# Patient Record
Sex: Female | Born: 1935 | Race: White | Hispanic: No | Smoking: Never smoker
Health system: Southern US, Community
[De-identification: ages and names within clinical notes are randomized; demographics above are authoritative.]

## PROBLEM LIST (undated history)

## (undated) DIAGNOSIS — K5790 Diverticulosis of intestine, part unspecified, without perforation or abscess without bleeding: Secondary | ICD-10-CM

## (undated) DIAGNOSIS — E785 Hyperlipidemia, unspecified: Secondary | ICD-10-CM

## (undated) DIAGNOSIS — I639 Cerebral infarction, unspecified: Secondary | ICD-10-CM

## (undated) DIAGNOSIS — H269 Unspecified cataract: Secondary | ICD-10-CM

## (undated) DIAGNOSIS — K279 Peptic ulcer, site unspecified, unspecified as acute or chronic, without hemorrhage or perforation: Secondary | ICD-10-CM

## (undated) DIAGNOSIS — I2510 Atherosclerotic heart disease of native coronary artery without angina pectoris: Secondary | ICD-10-CM

## (undated) DIAGNOSIS — M810 Age-related osteoporosis without current pathological fracture: Secondary | ICD-10-CM

## (undated) DIAGNOSIS — Z889 Allergy status to unspecified drugs, medicaments and biological substances status: Secondary | ICD-10-CM

## (undated) DIAGNOSIS — F419 Anxiety disorder, unspecified: Secondary | ICD-10-CM

## (undated) DIAGNOSIS — Z9861 Coronary angioplasty status: Secondary | ICD-10-CM

## (undated) DIAGNOSIS — K311 Adult hypertrophic pyloric stenosis: Secondary | ICD-10-CM

## (undated) DIAGNOSIS — I480 Paroxysmal atrial fibrillation: Secondary | ICD-10-CM

## (undated) DIAGNOSIS — F329 Major depressive disorder, single episode, unspecified: Secondary | ICD-10-CM

## (undated) DIAGNOSIS — F32A Depression, unspecified: Secondary | ICD-10-CM

## (undated) DIAGNOSIS — M1990 Unspecified osteoarthritis, unspecified site: Secondary | ICD-10-CM

## (undated) DIAGNOSIS — I701 Atherosclerosis of renal artery: Secondary | ICD-10-CM

## (undated) DIAGNOSIS — G459 Transient cerebral ischemic attack, unspecified: Secondary | ICD-10-CM

## (undated) DIAGNOSIS — I10 Essential (primary) hypertension: Secondary | ICD-10-CM

## (undated) DIAGNOSIS — K219 Gastro-esophageal reflux disease without esophagitis: Secondary | ICD-10-CM

## (undated) DIAGNOSIS — Z9289 Personal history of other medical treatment: Secondary | ICD-10-CM

## (undated) HISTORY — PX: TONSILLECTOMY: SUR1361

## (undated) HISTORY — DX: Atherosclerosis of renal artery: I70.1

## (undated) HISTORY — DX: Hyperlipidemia, unspecified: E78.5

## (undated) HISTORY — DX: Depression, unspecified: F32.A

## (undated) HISTORY — DX: Cerebral infarction, unspecified: I63.9

## (undated) HISTORY — PX: TRANSTHORACIC ECHOCARDIOGRAM: SHX275

## (undated) HISTORY — PX: COLONOSCOPY: SHX174

## (undated) HISTORY — DX: Diverticulosis of intestine, part unspecified, without perforation or abscess without bleeding: K57.90

## (undated) HISTORY — DX: Unspecified osteoarthritis, unspecified site: M19.90

## (undated) HISTORY — DX: Gastro-esophageal reflux disease without esophagitis: K21.9

## (undated) HISTORY — DX: Anxiety disorder, unspecified: F41.9

## (undated) HISTORY — DX: Coronary angioplasty status: Z98.61

## (undated) HISTORY — DX: Essential (primary) hypertension: I10

## (undated) HISTORY — DX: Unspecified cataract: H26.9

## (undated) HISTORY — DX: Atherosclerotic heart disease of native coronary artery without angina pectoris: I25.10

## (undated) HISTORY — DX: Allergy status to unspecified drugs, medicaments and biological substances: Z88.9

## (undated) HISTORY — PX: CATARACT EXTRACTION: SUR2

## (undated) HISTORY — DX: Paroxysmal atrial fibrillation: I48.0

## (undated) HISTORY — DX: Major depressive disorder, single episode, unspecified: F32.9

## (undated) HISTORY — DX: Adult hypertrophic pyloric stenosis: K31.1

## (undated) HISTORY — DX: Peptic ulcer, site unspecified, unspecified as acute or chronic, without hemorrhage or perforation: K27.9

## (undated) HISTORY — PX: JOINT REPLACEMENT: SHX530

## (undated) HISTORY — DX: Transient cerebral ischemic attack, unspecified: G45.9

## (undated) HISTORY — DX: Personal history of other medical treatment: Z92.89

## (undated) HISTORY — DX: Age-related osteoporosis without current pathological fracture: M81.0

---

## 1998-02-02 ENCOUNTER — Other Ambulatory Visit: Admission: RE | Admit: 1998-02-02 | Discharge: 1998-02-02 | Payer: Self-pay | Admitting: Family Medicine

## 1998-02-09 ENCOUNTER — Other Ambulatory Visit: Admission: RE | Admit: 1998-02-09 | Discharge: 1998-02-09 | Payer: Self-pay | Admitting: Family Medicine

## 1998-04-19 ENCOUNTER — Ambulatory Visit (HOSPITAL_COMMUNITY): Admission: RE | Admit: 1998-04-19 | Discharge: 1998-04-19 | Payer: Self-pay | Admitting: Gastroenterology

## 1999-02-15 ENCOUNTER — Other Ambulatory Visit: Admission: RE | Admit: 1999-02-15 | Discharge: 1999-02-15 | Payer: Self-pay | Admitting: Family Medicine

## 2000-05-29 ENCOUNTER — Other Ambulatory Visit: Admission: RE | Admit: 2000-05-29 | Discharge: 2000-05-29 | Payer: Self-pay | Admitting: Family Medicine

## 2001-11-10 ENCOUNTER — Encounter: Payer: Self-pay | Admitting: Gastroenterology

## 2001-11-10 DIAGNOSIS — K222 Esophageal obstruction: Secondary | ICD-10-CM

## 2001-11-10 DIAGNOSIS — K5730 Diverticulosis of large intestine without perforation or abscess without bleeding: Secondary | ICD-10-CM | POA: Insufficient documentation

## 2002-03-13 ENCOUNTER — Encounter: Admission: RE | Admit: 2002-03-13 | Discharge: 2002-03-13 | Payer: Self-pay | Admitting: Family Medicine

## 2002-03-13 ENCOUNTER — Encounter: Payer: Self-pay | Admitting: Family Medicine

## 2002-04-22 HISTORY — PX: REPLACEMENT TOTAL HIP W/  RESURFACING IMPLANTS: SUR1222

## 2002-06-02 ENCOUNTER — Encounter: Payer: Self-pay | Admitting: Orthopedic Surgery

## 2002-06-08 ENCOUNTER — Encounter: Payer: Self-pay | Admitting: Orthopedic Surgery

## 2002-06-08 ENCOUNTER — Inpatient Hospital Stay (HOSPITAL_COMMUNITY): Admission: RE | Admit: 2002-06-08 | Discharge: 2002-06-12 | Payer: Self-pay | Admitting: Orthopedic Surgery

## 2002-06-20 DIAGNOSIS — I2510 Atherosclerotic heart disease of native coronary artery without angina pectoris: Secondary | ICD-10-CM

## 2002-06-20 DIAGNOSIS — Z9861 Coronary angioplasty status: Secondary | ICD-10-CM

## 2002-07-23 DIAGNOSIS — I2510 Atherosclerotic heart disease of native coronary artery without angina pectoris: Secondary | ICD-10-CM

## 2002-07-23 HISTORY — DX: Atherosclerotic heart disease of native coronary artery without angina pectoris: I25.10

## 2003-06-23 ENCOUNTER — Inpatient Hospital Stay (HOSPITAL_COMMUNITY): Admission: AD | Admit: 2003-06-23 | Discharge: 2003-06-25 | Payer: Self-pay | Admitting: Cardiology

## 2003-06-24 HISTORY — PX: PERCUTANEOUS CORONARY STENT INTERVENTION (PCI-S): SHX6016

## 2003-07-14 ENCOUNTER — Emergency Department (HOSPITAL_COMMUNITY): Admission: EM | Admit: 2003-07-14 | Discharge: 2003-07-14 | Payer: Self-pay | Admitting: Emergency Medicine

## 2003-07-22 ENCOUNTER — Ambulatory Visit (HOSPITAL_COMMUNITY): Admission: RE | Admit: 2003-07-22 | Discharge: 2003-07-23 | Payer: Self-pay | Admitting: Cardiovascular Disease

## 2003-07-22 HISTORY — PX: RENAL ARTERY STENT: SHX2321

## 2003-11-25 ENCOUNTER — Ambulatory Visit (HOSPITAL_COMMUNITY): Admission: RE | Admit: 2003-11-25 | Discharge: 2003-11-25 | Payer: Self-pay | Admitting: Family Medicine

## 2003-11-26 ENCOUNTER — Inpatient Hospital Stay (HOSPITAL_COMMUNITY): Admission: EM | Admit: 2003-11-26 | Discharge: 2003-12-01 | Payer: Self-pay | Admitting: Emergency Medicine

## 2004-01-07 ENCOUNTER — Inpatient Hospital Stay (HOSPITAL_COMMUNITY): Admission: EM | Admit: 2004-01-07 | Discharge: 2004-01-21 | Payer: Self-pay | Admitting: Emergency Medicine

## 2004-01-10 DIAGNOSIS — K311 Adult hypertrophic pyloric stenosis: Secondary | ICD-10-CM | POA: Insufficient documentation

## 2004-01-13 ENCOUNTER — Encounter (INDEPENDENT_AMBULATORY_CARE_PROVIDER_SITE_OTHER): Payer: Self-pay | Admitting: Specialist

## 2004-01-13 HISTORY — PX: VAGOTOMY AND PYLOROPLASTY: SHX831

## 2005-05-09 ENCOUNTER — Encounter: Admission: RE | Admit: 2005-05-09 | Discharge: 2005-05-09 | Payer: Self-pay | Admitting: Obstetrics and Gynecology

## 2005-07-23 HISTORY — PX: CORONARY ANGIOPLASTY WITH STENT PLACEMENT: SHX49

## 2005-11-13 ENCOUNTER — Ambulatory Visit (HOSPITAL_COMMUNITY): Admission: RE | Admit: 2005-11-13 | Discharge: 2005-11-14 | Payer: Self-pay | Admitting: Cardiovascular Disease

## 2005-11-13 HISTORY — PX: PERCUTANEOUS CORONARY STENT INTERVENTION (PCI-S): SHX6016

## 2007-02-26 ENCOUNTER — Ambulatory Visit: Payer: Self-pay | Admitting: Internal Medicine

## 2007-02-26 LAB — CONVERTED CEMR LAB
Albumin: 3.4 g/dL — ABNORMAL LOW (ref 3.5–5.2)
Basophils Relative: 0.4 % (ref 0.0–1.0)
CO2: 32 meq/L (ref 19–32)
Calcium: 9.4 mg/dL (ref 8.4–10.5)
Creatinine, Ser: 1.3 mg/dL — ABNORMAL HIGH (ref 0.4–1.2)
GFR calc non Af Amer: 41 mL/min
Glucose, Bld: 115 mg/dL — ABNORMAL HIGH (ref 70–99)
MCHC: 33.3 g/dL (ref 30.0–36.0)
Monocytes Relative: 12.4 % — ABNORMAL HIGH (ref 3.0–11.0)
Platelets: 217 10*3/uL (ref 150–400)
Potassium: 3.8 meq/L (ref 3.5–5.1)
RBC: 3.85 M/uL — ABNORMAL LOW (ref 3.87–5.11)
RDW: 12.6 % (ref 11.5–14.6)
Sodium: 142 meq/L (ref 135–145)
Total Bilirubin: 0.6 mg/dL (ref 0.3–1.2)
Total Protein: 7.1 g/dL (ref 6.0–8.3)
WBC: 9.6 10*3/uL (ref 4.5–10.5)

## 2007-03-03 ENCOUNTER — Ambulatory Visit: Payer: Self-pay | Admitting: Gastroenterology

## 2007-03-03 ENCOUNTER — Encounter: Payer: Self-pay | Admitting: Internal Medicine

## 2007-03-03 DIAGNOSIS — K2970 Gastritis, unspecified, without bleeding: Secondary | ICD-10-CM | POA: Insufficient documentation

## 2007-03-03 DIAGNOSIS — K2990 Gastroduodenitis, unspecified, without bleeding: Secondary | ICD-10-CM

## 2007-04-21 DIAGNOSIS — K219 Gastro-esophageal reflux disease without esophagitis: Secondary | ICD-10-CM

## 2007-04-21 DIAGNOSIS — M1990 Unspecified osteoarthritis, unspecified site: Secondary | ICD-10-CM | POA: Insufficient documentation

## 2007-04-21 DIAGNOSIS — I10 Essential (primary) hypertension: Secondary | ICD-10-CM

## 2007-08-08 ENCOUNTER — Encounter: Admission: RE | Admit: 2007-08-08 | Discharge: 2007-08-08 | Payer: Self-pay | Admitting: Family Medicine

## 2007-08-24 HISTORY — PX: NM MYOCAR PERF WALL MOTION: HXRAD629

## 2007-10-30 DIAGNOSIS — K279 Peptic ulcer, site unspecified, unspecified as acute or chronic, without hemorrhage or perforation: Secondary | ICD-10-CM

## 2007-10-30 DIAGNOSIS — Z8719 Personal history of other diseases of the digestive system: Secondary | ICD-10-CM | POA: Insufficient documentation

## 2007-10-30 DIAGNOSIS — I701 Atherosclerosis of renal artery: Secondary | ICD-10-CM

## 2007-10-30 DIAGNOSIS — E785 Hyperlipidemia, unspecified: Secondary | ICD-10-CM | POA: Insufficient documentation

## 2007-10-30 DIAGNOSIS — K3184 Gastroparesis: Secondary | ICD-10-CM

## 2007-10-30 DIAGNOSIS — I739 Peripheral vascular disease, unspecified: Secondary | ICD-10-CM

## 2008-07-29 ENCOUNTER — Encounter: Admission: RE | Admit: 2008-07-29 | Discharge: 2008-07-29 | Payer: Self-pay

## 2009-01-13 ENCOUNTER — Inpatient Hospital Stay (HOSPITAL_COMMUNITY): Admission: AD | Admit: 2009-01-13 | Discharge: 2009-01-19 | Payer: Self-pay | Admitting: Cardiology

## 2009-01-17 HISTORY — PX: CARDIAC CATHETERIZATION: SHX172

## 2009-03-11 ENCOUNTER — Encounter: Admission: RE | Admit: 2009-03-11 | Discharge: 2009-03-11 | Payer: Self-pay | Admitting: Family Medicine

## 2009-12-14 ENCOUNTER — Encounter: Payer: Self-pay | Admitting: Gastroenterology

## 2010-07-23 DIAGNOSIS — K311 Adult hypertrophic pyloric stenosis: Secondary | ICD-10-CM

## 2010-07-23 HISTORY — DX: Adult hypertrophic pyloric stenosis: K31.1

## 2010-08-22 NOTE — Letter (Signed)
Summary: [facility]  [facility]   Imported By: Sherian Rein 01/02/2010 11:30:03  _____________________________________________________________________  External Attachment:    Type:   Image     Comment:   External Document

## 2010-10-30 LAB — CBC
HCT: 38.3 % (ref 36.0–46.0)
Hemoglobin: 11.3 g/dL — ABNORMAL LOW (ref 12.0–15.0)
Hemoglobin: 11.6 g/dL — ABNORMAL LOW (ref 12.0–15.0)
MCHC: 32.9 g/dL (ref 30.0–36.0)
MCHC: 33 g/dL (ref 30.0–36.0)
MCHC: 33.5 g/dL (ref 30.0–36.0)
MCV: 97.9 fL (ref 78.0–100.0)
Platelets: 148 10*3/uL — ABNORMAL LOW (ref 150–400)
Platelets: 180 10*3/uL (ref 150–400)
RBC: 3.47 MIL/uL — ABNORMAL LOW (ref 3.87–5.11)
RBC: 3.64 MIL/uL — ABNORMAL LOW (ref 3.87–5.11)
RBC: 3.91 MIL/uL (ref 3.87–5.11)
RDW: 13.1 % (ref 11.5–15.5)
RDW: 13.4 % (ref 11.5–15.5)
RDW: 13.9 % (ref 11.5–15.5)
WBC: 7.8 10*3/uL (ref 4.0–10.5)
WBC: 8.4 10*3/uL (ref 4.0–10.5)

## 2010-10-30 LAB — HEPARIN LEVEL (UNFRACTIONATED)
Heparin Unfractionated: 0.49 IU/mL (ref 0.30–0.70)
Heparin Unfractionated: 0.62 IU/mL (ref 0.30–0.70)
Heparin Unfractionated: 0.88 IU/mL — ABNORMAL HIGH (ref 0.30–0.70)

## 2010-10-30 LAB — CARDIAC PANEL(CRET KIN+CKTOT+MB+TROPI)
CK, MB: 0.8 ng/mL (ref 0.3–4.0)
CK, MB: 0.8 ng/mL (ref 0.3–4.0)
Total CK: 31 U/L (ref 7–177)
Total CK: 37 U/L (ref 7–177)
Troponin I: 0.02 ng/mL (ref 0.00–0.06)
Troponin I: 0.02 ng/mL (ref 0.00–0.06)

## 2010-10-30 LAB — BASIC METABOLIC PANEL
CO2: 28 mEq/L (ref 19–32)
Calcium: 8.7 mg/dL (ref 8.4–10.5)
Calcium: 8.8 mg/dL (ref 8.4–10.5)
Creatinine, Ser: 1.24 mg/dL — ABNORMAL HIGH (ref 0.4–1.2)
GFR calc Af Amer: 49 mL/min — ABNORMAL LOW (ref >60)
GFR calc non Af Amer: 41 mL/min — ABNORMAL LOW (ref >60)
GFR calc non Af Amer: 54 mL/min — ABNORMAL LOW (ref >60)
Glucose, Bld: 105 mg/dL — ABNORMAL HIGH (ref 70–99)
Sodium: 143 mEq/L (ref 135–145)

## 2010-10-30 LAB — COMPREHENSIVE METABOLIC PANEL
ALT: 23 U/L (ref 0–35)
AST: 23 U/L (ref 0–37)
Albumin: 3.7 g/dL (ref 3.5–5.2)
BUN: 13 mg/dL (ref 6–23)
Chloride: 106 mEq/L (ref 96–112)
Creatinine, Ser: 1.09 mg/dL (ref 0.4–1.2)

## 2010-10-30 LAB — PROTIME-INR
INR: 1 (ref 0.00–1.49)
Prothrombin Time: 13.5 seconds (ref 11.6–15.2)
Prothrombin Time: 13.8 seconds (ref 11.6–15.2)

## 2010-10-30 LAB — DIFFERENTIAL
Eosinophils Absolute: 0.1 10*3/uL (ref 0.0–0.7)
Eosinophils Relative: 1 % (ref 0–5)
Lymphocytes Relative: 19 % (ref 12–46)
Monocytes Absolute: 0.8 10*3/uL (ref 0.1–1.0)

## 2010-10-30 LAB — APTT: aPTT: 27 seconds (ref 24–37)

## 2010-10-30 LAB — BRAIN NATRIURETIC PEPTIDE: Pro B Natriuretic peptide (BNP): 215 pg/mL — ABNORMAL HIGH (ref 0.0–100.0)

## 2010-12-05 NOTE — Cardiovascular Report (Signed)
NAMEMAYZIE, CAUGHLIN NO.:  [id number]   MEDICAL RECORD NO.:  [id number]           PATIENT TYPE:   LOCATION:                                 FACILITY:   PHYSICIAN:  Nanetta Batty, M.D.   DATE OF BIRTH:  05-01-1918   DATE OF PROCEDURE:  DATE OF DISCHARGE:                            CARDIAC CATHETERIZATION   [name] is an 77 year old female with history of RCA and LAD  stenting in the past.  She was admitted on January 13, 2009, with  abdominal/chest pain.  KUB was negative.  Her pain got better with nitro  and was worse with discontinuation of the nitroglycerin.  She ruled out  for myocardial infarction.  She presents now for diagnostic coronary  arteriography to define her anatomy and rule out ischemic etiology.   PROCEDURE DESCRIPTION:  The patient was brought to the second floor at  [facility] in a postabsorptive state.  She is  premedicated with p.o. Valium and IV fentanyl.  Her right groin was  prepped and shaved in the usual sterile fashion.  A 1% Xylocaine was  used for local anesthesia.  A 6-French sheath was inserted into the  right femoral artery using standard Seldinger technique.  A 6-French  left Judkins diagnostic catheter, as well as 6-French pigtail catheter,  used for selective coronary angiography and left ventriculography  respectively.  Visipaque dye was used through the entirety of the case.  Retrograde aortic, left ventricular, and pullback pressures were  recorded.   HEMODYNAMICS:  Aortic systolic pressure 196, diastolic pressure 55, left  ventricular systolic pressure 186, end-diastolic pressure 16.   Selective coronary angiography:  1. Left main normal.  2. LAD normal.  The LAD stent was widely patent after the diagonal      branch.  3. Left circumflex; free of significant disease.  4. Right coronary artery; large dominant vessel with a patent stent in      the proximal bend.   Left ventriculography; RAO left  ventriculogram was performed using 25 mL  of Visipaque dye at 12 mL per second.  The overall LVEF was estimated  greater than 60% without focal wall motion abnormalities.   IMPRESSION:  [name] has patent stents with normal coronary arteries  and left ventricular function.  I  believe her chest pain is noncardiac.  Empiric antireflux therapy will be recommended.   Sheath was removed, and pressure was held on the groin to achieve  hemostasis.  The patient left the lab in stable condition.  Foley  catheter was placed in the cath lab.  The patient was placed on  intravenous nitroglycerin for blood pressure reduction prior to sheath  removal.      Nanetta Batty, M.D.  Electronically Signed     JB/MEDQ  D:  01/17/2009  T:  01/18/2009  Job:  [id number]   cc:   Second Floor [place] [facility]  [facility]  Richard A. Alanda Amass, M.D.  Melida Quitter, M.D.

## 2010-12-05 NOTE — H&P (Signed)
[name], [name]              ACCOUNT NO.:  [id number]   MEDICAL RECORD NO.:  [id number]           PATIENT TYPE:  INP   LOCATION:  2928                         FACILITY:  [facility]   PHYSICIAN:  Sheliah Mends, MD      DATE OF BIRTH:  [birth year]   DATE OF ADMISSION:  01/13/2009  DATE OF DISCHARGE:                              HISTORY & PHYSICAL   IDENTIFICATION:  An 77 year old lady with chest pain.   HISTORY OF PRESENT ILLNESS:  [name] is an 77 year old lady who has  been followed by Dr. Alanda Amass for a history of coronary artery disease,  status post stent placement to the RCA in 2004, for single-vessel  coronary artery disease with a nondrug-eluting stent.  [name]  completed an echocardiogram in September 2009, that showed normal left  ventricular function, as well as a Cardiolite in 2009, without evidence  of ischemia.   [name] returns today to the office at [facility] and ask for an urgent appointment for complaints of left-  sided chest pain with radiation to the left arm.  She describes  shortness of breath and left rib and chest pain with radiation.  In  addition, she is fatigued and has mildly increased lower extremity  edema.  She also feels lightheaded and dizzy.   PAST MEDICAL HISTORY:  1. Coronary artery disease with single-vessel disease and stent      placement to the RCA.  2. Dyslipidemia.  3. Hypertension.   MEDICATIONS:  1. Estradiol 0.5 mg p.o. daily.  2. Metoclopramide 5 mg p.r.n.  3. Protonix 40 mg p.o. b.i.d.  4. Simvastatin 20 mg p.o. daily.  5. Plavix 75 mg p.o. daily.  6. Diovan 80 mg p.o. daily.  7. Folic acid 1 mg p.o. daily.  8. Meloxicam 50 mg p.o. p.r.n.  9. Aspirin 81 mg p.o. daily.  10.Multivitamin.  11.Calcium.  12.Metoprolol 25 mg p.o. daily.   ALLERGIES:  No known drug allergies.   SOCIAL HISTORY:  The patient is widowed.  She has three children, two  grandchildren, and one great  grandchild.  She has no history of tobacco,  alcohol, and IV drug abuse.   FAMILY HISTORY:  The patient's siblings have a history of coronary  artery disease and kidney disease.   REVIEW OF SYSTEMS:  Positive as above.   PHYSICAL EXAMINATION:  GENERAL:  The patient is alert and oriented x3.  VITAL SIGNS:  Blood pressure 202/78, heart rate 52, weight 146.  NECK:  Supple.  Normal JVP.  No carotid bruit.  LUNGS:  Clear to auscultation bilaterally.  No rales or wheezes.  HEART:  Regular rate and rhythm with 2/6 systolic murmur at the apex.  ABDOMEN:  Soft, nontender, nondistended.  Positive bowel sounds.  EXTREMITIES:  No edema, 2+ pulses bilaterally.   IMPRESSION:  1. New onset of chest pain with a history of coronary artery disease      consistent with acute coronary syndrome.  2. Hypertension, uncontrolled.  3. Dyslipidemia on statin.  4. Pepcid.   PLAN:  [name] will be admitted  to [facility] for an acute  coronary syndrome.  She will be admitted to the step-down CCU and  started on nitroglycerin IV for blood pressure and chest pain control.  The patient will be followed with cardiac biomarkers and serial EKGs.  She will be continued on her outpatient medications including aspirin,  Plavix, simvastatin, and Diovan.  [name] will be kept n.p.o. after  midnight and Dr. Alanda Amass will see her in the morning to make a  decision for further workup.  The patient may undergo cardiac stress  testing or coronary angiography.   I discussed my recommendations with [name] who agrees with hospital  admission.  Dr. Alanda Amass will be notified.      Sheliah Mends, MD  Electronically Signed     JE/MEDQ  D:  01/13/2009  T:  01/14/2009  Job:  [id number]

## 2010-12-05 NOTE — Assessment & Plan Note (Signed)
[facility]                         GASTROENTEROLOGY OFFICE NOTE   [name], [name]                       MRN:          [id number]  DATE:02/26/2007                            DOB:          11-08-1917    REFERRING PHYSICIAN:  Holley Bouche, M.D.   PROBLEM LIST:  Nausea and vomiting, recurrent.   HISTORY:  [name] is a very nice 77 year old white female known to Dr.  Jarold Motto who was last seen here approximately 3 years ago.  She does  have a history of a gastric outlet obstruction for which she was  hospitalized in June 2005.  This was secondary to chronic peptic ulcer  disease, and she underwent vagotomy and pyloroplasty per Dr. Zachery Dakins.  She has been maintained on Prilosec since that time, 40 mg b.i.d.  She  also unfortunately requires chronic NSAID use and has been on Celebrex  200 mg daily chronically for arthritic symptoms.  She and her daughter  feel that she really could not get by very well without the Celebrex.  She is on Plavix 75 daily, and also takes a baby aspirin daily.  She has  coronary artery disease, and is status post stent approximately 2 years  ago.  At this time, she comes back in stating that she has been having  trouble again over the past few months, with gradually progressive  symptoms, and it feels just like it did when she was blocked before.  She has been having early satiety symptoms, has had weight loss of about  5 pounds over the past 2 months.  She is not having any dysphagia or  odynophagia, but says usually early in the morning she will spit up  undigested food, and then sometimes later in the day after attempting to  eat a few meals she will vomit occasionally.  She has not had any  hematemesis.  No melena or hematochezia.  She also mentions that she has  been having diarrhea perhaps 1 day a week, but has been taking Mylanta  on a fairly regular basis.   CURRENT MEDICATIONS:  1. Norvasc 5 mg daily.  2.  Diovan 160/12.5 daily.  3. Isosorbide 30 daily.  4. Menest 0.625 daily.  5. Plavix 75 daily.  6. Celebrex 200 q.a.m.  7. Folic acid 1 mg daily.  8. Hydroxyzine t.i.d.  9. Aspirin 81 mg daily.  10.Multivitamin daily.  11.Calcium supplement daily.  12.Forteo injection at bedtime.  13.Protonix 40 mg b.i.d.   ALLERGIES:  No known drug allergies.   PAST HISTORY:  1. As outlined above.  2. She does have hypertension.  3. Coronary artery disease.  4. Had coronary stent placed about 2 years ago.  5. She also has a renal stent.  6. Hyperlipidemia.  7. Arthritis.  8. Hip replacement about 4 years ago.   FAMILY HISTORY:  Pertinent for uterine cancer in a sister, a brother and  sister with heart disease.   SOCIAL HISTORY:  The patient is married and lives with her husband.  She  is retired.  Accompanied by her daughter today.  No tobacco, no EtOH.  REVIEW OF SYSTEMS:  GI:  As outlined above.  CARDIOVASCULAR:  Denies any  chest pain or anginal symptoms.  PULMONARY:  Negative for cough,  shortness of breath, or sputum production.  MUSCULOSKELETAL:  Pertinent  for generalized arthritic symptoms, worse in her upper extremities and  upper arms and shoulders generally.  GU:  She does have some stress  urinary incontinence.  Review of systems otherwise unremarkable.   PHYSICAL EXAMINATION:  GENERAL:  Well-developed elderly white female in  no acute distress.  VITAL SIGNS:  Height is 5 feet 3 inches, weight is 149, blood pressure  110/54, pulse is 84.  HEENT:  Nontraumatic, normocephalic.  EOMI.  PERRLA.  Sclerae anicteric.  NECK:  Supple, without nodes.  CARDIOVASCULAR:  Regular rate and rhythm, with S1 and S2.  No murmur,  rub, or gallop.  PULMONARY:  Clear to A&P.  ABDOMEN:  Soft.  Bowel sounds are active.  She does have a succussion  splash.  She is nontender.  There is no mass or hepatosplenomegaly.  RECTAL:  Hemoccult negative, without mass.  EXTREMITIES:  No clubbing, cyanosis,  or edema.   IMPRESSION:  1. An 77 year old white female, status post vagotomy and pyloroplasty      for gastric outlet obstruction due to chronic peptic ulcer disease      in 2005, now with recurrent similar symptoms, with early satiety      and intermittent nausea and vomiting, concerned she has a recurrent      partial outlet obstruction, likely exacerbated by medications,      i.e., aspirin, Plavix, and Celebrex.  2. Coronary artery disease, status post stent.  3. Osteoarthritis.  4. Renal artery stenosis, status post stenting.  5. Hypertension.  6. Hyperlipidemia.   PLAN:  1. Check CBC with differential and CMET today.  2. Continue Protonix 40 mg b.i.d.  3. Add Carafate 1 g liquid between meals.  4. Will continue her aspirin and Plavix, as well as her Celebrex,      until endoscopy can be completed and her stomach reassessed.      Obviously, she would do better from a GI standpoint if she could      come off of any of these medications, and pending results will have      further discussion about her medication.  5. Endoscopy scheduled with Dr. Jarold Motto on March 03, 2007 at 3:30      p.m.      Mike Gip, PA-C  Electronically Signed      Iva Boop, MD,FACG  Electronically Signed   AE/MedQ  DD: 02/26/2007  DT: 02/27/2007  Job #: [id number]

## 2010-12-08 NOTE — Cardiovascular Report (Signed)
[name], [name]                          ACCOUNT NO.:  [id number]   MEDICAL RECORD NO.:  [id number]                   PATIENT TYPE:  INP   LOCATION:  2022                                 FACILITY:  [facility]   PHYSICIAN:  Richard A. Alanda Amass, M.D.          DATE OF BIRTH:  03-17-1918   DATE OF PROCEDURE:  06/24/2003  DATE OF DISCHARGE:                              CARDIAC CATHETERIZATION   PROCEDURE:  1. Retrograde central aortic catheterization.  2. Selective coronary angiography by Judkins technique pre and post     intracoronary nitroglycerin administration.  3. Subselective left coronary injection, selective right coronary injection.  4. Subselective right internal mammary artery, right brachiocephalic     injection.  5. Left ventricular angiogram RAO/LAO projection.  6. Abdominal aortic angiogram mid stream PA projection.  7. Weight adjusted heparin.  8. Aggrastat double bolus plus infusion.  9. Plavix 300 p.o.  10.      Continued aspirin.  11.      Right coronary artery proximal high grade culprit lesion stenosis     predilated POBA.  12.      Subsequent bare metal stainless steel ACS Zeta 4.0 x 15 stent, high     pressure inflation.  13.      Intravenous labetalol 10 mg.   BRIEF HISTORY:  [name] is an 77 year old white married mother of three  with two grandchildren who is a nonsmoker.  Her husband, Anaaya Fuster, is  also a patient of ours.  Hadlei has been seen annually for follow-up of  hypertension and intermittent palpitations.  She also has a history of GERD.  She previously did not have any history to suggest coronary disease and had  a negative Cardiolite prior for ischemia done for preoperative clearance for  right hip replacement by Ollen Gross, M.D. June 08, 2002.  This was  done without complications.  She has been taking p.r.n. beta blockers, low  dose Lopressor for palpitations.  She has had intermittent fatigue and  dizziness in the past, but  no syncope or presyncope.   She was admitted to the hospital June 23, 2003 because of episodic  palpitations, lightheadedness, presyncope, and substernal chest pain.  She  had sinus bradycardia, rates 44-46.  Had recently taken beta blocker therapy  at 12.5 mg x2 over 24-hour period for palpitations.   Myocardial infarction was ruled out by serial enzymes and EKGs.  Renal  function was normal and it was elected to proceed with diagnostic  catheterization in this setting.  Informed consent was obtained from the  patient and her husband to proceed.   DESCRIPTION OF PROCEDURE:  The patient was brought to the second floor CP  laboratory in a postabsorptive state after 5 mg Valium p.o. pre medication.  1% Xylocaine was used for local anesthesia and the CRFA was entered with a  single anterior puncture using an 18 thin wall needle.  It was felt,  however, that we probably went through a small calcific plaque based on the  feel.  There was oozing around a 6-French sheath so this was upgraded to a 7-  [place] sheath which seemed to help it.  Guidewire exchange was used  throughout the procedure.  Omnipaque dye was used.  Left coronary angiogram  was done subselectively.  A 4 cm taper catheter did not selectively engage  the left coronary.  A 3.5 cm taper was then used and switched to a Scimed  from a Cordis.  The patient began having chest discomfort with inferior ST  elevation at this time.  Subselective left coronary injections were  performed with good visualization showing no significant left coronary  disease or main left disease or dissection.  Right coronary angiography was  then performed with a 6-French 4 cm taper Cordis right coronary catheter  demonstrating a high grade proximal RCA stenosis.  The patient was started  on IV nitroglycerin and given intracoronary nitroglycerin and the pain  resolved with TIMI 3 flow in the right coronary artery.  ST changes resolved  as well.   LV  angiogram was then done in the RAO/LAO projection 25 mL/14 mL/second, 20  mL/12 mL/second.  Pullback pressure of the CA was performed and it showed no  gradient across the aortic valve.  Abdominal aortic angiogram was done in  the mid stream PA projection above the level of the renal arteries 25 mL/20  mL/second with visualization to the right iliac area.  Catheters removed.  Sidearm sheath was flushed.  It was felt that this patient would probably  require PCI and there was oozing around the 7-French sheath so it was felt  best to upgrade this to an 8-French short sidearm sheath.  This was done  without difficulty and seemed to control the oozing fairly well.   PRESSURES:  1. LV 195/0.  LVEDP 18-24 mmHg.  2. CA 195/90 mmHg.  3. There was no gradient across the aortic valve on catheter pullback.  4. Arterial pressures were modified using IC nitroglycerin, 10 mg of     labetalol, intracoronary nitroglycerin during the procedure and ranged     from 140-180 mmHg.  The patient maintained sinus rhythm without     significant bradycardia.  There was, however, transient bradycardia     during intervention with RCA occlusion, balloon occlusion.   LEFT VENTRICULOGRAM:  LV angiogram demonstrated very minimal hypokinesis of  basilar inferior wall with no other wall motion abnormality.  EF  approximately 55% with angiographic LVH.  There was no significant mitral  regurgitation present.  Mitral anular calcification was seen and  costochondral junction calcification was seen commensurate with her age.   ABDOMINAL AORTIC ANGIOGRAM:  Abdominal aortic angiogram showed a patent  celiac and SMA access.  The left renal artery is single with no significant  stenosis.  The right renal artery has calcific 95-99% stenosis just beyond  the ostia that was fairly focal.  The infrarenal abdominal aorta had moderate atherosclerotic disease with no aneurysm formation.  The IMA was  intact.  The common iliacs and  hypogastrics were intact and the proximal  external iliacs appeared normal.  A prosthetic right hip was visualized  fluoroscopically.   ANGIOGRAPHIC DATA:  1. The main left coronary artery was short and normal.  2. The left anterior descending artery had no significant disease across the     apex of the heart where it bifurcated and no spasm.  There was a  small     DX1 that was normal, a moderate sized DX2 after SP1 had 40% narrowing.     Just beyond this there was 30% LAD narrowing.  3. The circumflex artery was a nondominant vessel that gave off a     bifurcating marginal and small PABG and atrial branch was normal.  4. The right coronary was a large, highly dominant vessel.  There was a     marked proximal Shepherd's crook.  Just before the superior proximal bend     of the RCA was a 95% mildly calcific fairly concentric stenosis.  There     was no visible thrombus and there was TIMI 3 flow to the dominant right     coronary.  The PLA gave off several distal branches, plus an AV node     branch and was normal.  The PDA was normal and two large RV branches from     the mid RCA were normal.   PCI:  It was elected to proceed with PCI in this setting.  The patient was  stable with no acute EKG changes or chest pain.   The right coronary was intubated with the JR4 7-French guiding catheter.  Before this, however, the patient was given 300 mg of Plavix, double bolus  Aggrastat (20 mcg/kg), and 5000 units of heparin.  ACTs were monitored  throughout the procedure and were therapeutic.  An Asahi 0.014 inch soft  wire was used to cross the high grade stenosis and was beyond the acute  margin.  The lesion was then crossed with a 2.5 x 15 ACS Voyager balloon.  This was brought down to the distal portion of the RCA where the guidewire  was then positioned into the distal vessel was free.  The balloon pulled  back to the proximal lesion and dilated at 6/30.  The balloon was pulled  back.  The  patient tolerated this well.  The lesion was then stented with a  bare metal ACS Zeta 4.0 x 15 stent that was positioned fluoroscopically,  deployed at 10/36, post dilated to 12/14 and 14/28.  The balloon was pulled  back.  IC nitroglycerin was administered periodically.  There was spasm  beyond the stent.  We pulled the wire back because of probable guidewire  __________.  There still appeared to be some narrowing beyond the stent  after IC nitroglycerin so the lesion was recrossed with the same 0.014 inch  wire.  We then gave nitroglycerin again and further injection showed  resolution of this narrowing compatible with spasm and no evidence of  dissection or tear.  The spasm was felt to be entirely relieved with good  TIMI 3 flow to the distal vessel so the guidewire and dilatation system were  removed.   There was very minimal oozing around the sheath.  The patient was in stable condition, brought to the holding area for ACT measurement, eventual sheath  removal, and pressure hemostasis with careful observation of her groin.   We recommend continued medical therapy with aspirin and Plavix and treatment  of her blood pressure.  She has high grade right renal artery stenosis which  will need to be addressed in view of her significant hypertension in a  staged fashion.  As far as her bradycardia is concerned we will hold beta  blockers at present because of her history of bradycardia.  However, this  may have been related to her large dominant right coronary artery ischemia  and  we may be able to reinstitute beta blockers, at least p.r.n. basis for  her palpitations again.   CATHETERIZATION DIAGNOSES:  1. ASHD, presyncope, symptomatic bradycardia, APCs chronic.  2. Ischemic right coronary culprit lesion, high grade proximal stenosis.  3. Successful PTCA and stenting high grade proximal right coronary artery     stenosis.  4. Severe hypertension with high grade right renal artery  stenosis.  5. Gastroesophageal reflux disease.  6. Hyperlipidemia.  7. Systemic hypertension.  8. Right total hip replacement October 2003.                                               Richard A. Alanda Amass, M.D.    RAW/MEDQ  D:  06/24/2003  T:  06/24/2003  Job:  [id number]   cc:   Ellin Saba., M.D.  [address] [place]  [place] [address]  Fax: [phone]   CP Lab

## 2010-12-08 NOTE — Discharge Summary (Signed)
[name], [name]                          ACCOUNT NO.:  [id number]   MEDICAL RECORD NO.:  [id number]                   PATIENT TYPE:  INP   LOCATION:  0465                                 FACILITY:  [facility]   PHYSICIAN:  Ollen Gross, M.D.                 DATE OF BIRTH:  Mar 24, 1918   DATE OF ADMISSION:  06/08/2002  DATE OF DISCHARGE:  06/12/2002                                 DISCHARGE SUMMARY   ADMITTING DIAGNOSES:  1. Osteoarthritis, right hip.  2. Hypertension.  3. History of gastritis.  4. Gastroesophageal reflux disease.  5. History of peptic ulcers.  6. History of supraventricular tachycardia with premature atrial     contractions.  7. Hyperlipidemia.   DISCHARGE DIAGNOSES:  1. Osteoarthritis right hip status post right total hip replacement     arthroplasty.  2. Postoperative blood loss anemia.  3. Status post transfusion without sequelae.  4. Hypertension.  5. History of gastritis.  6. Gastroesophageal reflux disease.  7. History of peptic ulcers.  8. History of supraventricular tachycardia with premature atrial     contractions.  9. Hyperlipidemia.   PROCEDURE:  The patient was taken to the OR on June 08, 2002.  Underwent  a right total hip replacement arthroplasty.  Surgeon Ollen Gross, M.D.  Assistant Alexzandrew L. Julien Girt, P.A.  Surgery under general anesthesia.  Estimated blood loss 800 cc.  Hemovac drain x1.   CONSULTS:  None.   BRIEF HISTORY:  The patient is an 77 year old female who has been seen by  Ollen Gross, M.D. for ongoing right hip pain that has been increasing over  several months and started to radiate down to the groin area and to the  right knee.  It feels out it wants to give out at times.  The pain has been  progressive, even starting to interfere with her night.  X-rays in the  office show end-stage osteoarthritis.  It is felt she had reached a point  where she could benefit from undergoing a total hip.  Risks and benefits  are  discussed.  The patient is subsequently admitted to the hospital.   LABORATORY DATA:  CBC on admission showed a hemoglobin of 12.9, hematocrit  of 38.5, white cell count 7.6, red cell count 4.1, serial differential  within normal limits.  Postoperative H&H 8.7, 25.5.  Continued to drop down  to 6.9 and 20.7.  Post transfusion hemoglobin 9.9, 29.2.  PT/PTT on  admission were 12.7 and 28, respectively with an INR of 0.9.  Serial pro  times followed through Coumadin protocol.  Last noted Pt/INR 21.2 and 2.1.  Chem panel on admission all within normal limits.  Serial BMETs were  followed.  Did have a drop in sodium from 139 to 130, back up to 137.  Glucose increased from 110 to 158, back down to 110.  Urinalysis on  admission negative.  Blood group type O+.  Chest x-ray preoperatively:  Fibrotic changes _______  right base with probable granuloma; however,  comparison would be suggested with previous chest x-rays.  Hip films:  Marked osteoarthritis subjectively of the right hip.  Postoperative hip  films on June 08, 2002:  Satisfactory position right total hip  replacement.   HOSPITAL COURSE:  The patient was admitted to [facility], taken to  OR.  Underwent above stated procedure without complications.  The patient  tolerated procedure well.  Was allowed to return to recovery room and then  to orthopedic floor for continued postoperative care.  Vital signs were  followed.  The patient was given 24 hours postoperative IV antibiotics.  Hemovac drain placed at time of surgery.  It was pulled on postoperative day  one.  Laboratories were followed very closely.  She did have a drop in  hemoglobin two subsequent days down to 6.9.  The patient was given 2 units  of blood.  Tolerated blood well.  Post transfusion hemoglobin back up to  9.9.  Physical therapy, occupational therapy were consulted postoperatively  to assist with gait training, ambulation, ADLs.  Progressed very well  with  physical therapy.  She was up ambulating approximately 60 feet by  postoperative day three and even greater than 100 feet by later that  evening.  Dressing change initiated on postoperative day two.  Incision was  healing well.  Continued to progress and by postoperative day four patient  was doing quite well, been weaned over to p.o. medications.  Had undergone  blood transfusion, improved, and was ready to go home.   DISCHARGE PLAN:  The patient discharged home on June 12, 2002.   DISCHARGE DIAGNOSES:  Please see above.   DISCHARGE MEDICATIONS:  1. Trinsicon.  2. Coumadin.  3. Percocet.  4. Robaxin.   DIET:  Low sodium diet.   FOLLOW UP:  Tuesday or Thursday after Thanksgiving on December 2 or December  4.   ACTIVITY:  Partial weightbearing 25-50%.  Home health PT and home health  nursing through [facility].  Hip precautions at all times.   DISPOSITION:  Home.   CONDITION ON DISCHARGE:  Improved.     Alexzandrew L. Julien Girt, P.A.              Ollen Gross, M.D.    ALP/MEDQ  D:  07/21/2002  T:  07/21/2002  Job:  [id number]

## 2010-12-08 NOTE — H&P (Signed)
[name], [name]                          ACCOUNT NO.:  [id number]   MEDICAL RECORD NO.:  [id number]                   PATIENT TYPE:  INP   LOCATION:  0457                                 FACILITY:  [facility]   PHYSICIAN:  Barbette Hair. Arlyce Dice, M.D. [facility]          DATE OF BIRTH:  [birth year]   DATE OF ADMISSION:  01/07/2004  DATE OF DISCHARGE:                                HISTORY & PHYSICAL   CHIEF COMPLAINT:  Nausea and vomiting x2 days.   HISTORY:  [name] is a very pleasant 77 year old white female primary patient  of Dr. Leonides Sake and known to Dr. Jarold Motto and to Dr. Yancey Flemings from  recent admission May 6 through Dec 01, 2003 at which time she had a partial  gastric outlet obstruction secondary to multiple antral ulcers with  surrounding edema and pinpoint pylorus felt secondary to scarring from prior  peptic ulcer disease.  She was not dilated during that admission secondary  to significant surrounding edema and this was to be done in 4-6 weeks as an  outpatient.  She did improve with bowel rest, IV PPI, etc.  We had attempted  NG placement which was quite traumatic and she actually had significant  epistaxis and the NG was aborted.  She was on aspirin and Plavix at the  time.  Nevertheless, she did improve symptomatically and did not require  gastric decompression.  She does have prior history of peptic ulcer disease  nondistended pyloric stenosis with the stenosis initially noted in 2003 and  prior antral ulcers documented in 1989.   She has been seen back in the office by Dr. Jarold Motto once and is scheduled  for EGD with attempted dilation.  However, she was seen in follow-up at Dr.  Tiburcio Pea' office today.  She has been unable to keep down p.o. over the past  48 hours.  She tried Ensure this morning and even that came back up.  She  denies any abdominal pain; has not noted any melena, hematochezia, or  hematemesis.  She has no fever or chills.  She says her weight has been  relatively stable and she has been up and about doing her usual activities,  maintaining her garden, etc.  She says she has been eating a very soft diet  since discharge but 2 days ago did eat a sausage biscuit.  She has been  taking her medications as directed, no aspirin or NSAIDs, and has not had  any Plavix today or yesterday.  She is admitted at this time for recurrent  versus persistent gastric outlet obstruction, question nonhealing of the  antral ulcers.   CURRENT MEDICATIONS:  1. Protonix 40 mg b.i.d.  2. Plavix 75 daily.  3. Premarin 0.625 daily.  4. Zocor 20 daily.  5. Diovan/hydrochlorothiazide 80/12.5 daily.  6. Folic acid daily.  7. Nu-Iron 150 daily.   ALLERGIES:  No known drug allergies.   PAST HISTORY:  1.  Coronary artery disease status post stent December 2004 per Dr.     Alanda Amass.  2. Renal artery stenosis status post stent December 2004.  3. Peptic ulcer disease with antral ulcer in 1989 and pyloric stenosis     initially documented in 2003.  She has never required dilation.  4. Osteoarthritis.  5. Hypertension.  6. Hyperlipidemia.  7. Status post right hip replacement.   FAMILY HISTORY:  Pertinent for diabetes and cancers.   SOCIAL HISTORY:  The patient is married, lives with her elderly husband.  Their daughter lives next-door.  She is very self-sufficient, maintaining a  large garden, canning, etc. and is upset about hospitalization as the  potatoes are coming in.  No tobacco and no alcohol.   REVIEW OF SYSTEMS:  CARDIOVASCULAR:  Denies any chest pain or anginal  symptoms.  PULMONARY:  Negative for cough, shortness of breath, or sputum  production.  GENITOURINARY:  Denies any dysuria, urgency, or frequency.  She  does have some arthritis complaints.  Her left hip has been bothering her  and apparently has been told that she needs a left hip replacement.   PHYSICAL EXAMINATION:  GENERAL:  A well-developed elderly white female in no  acute distress.   She is alert and oriented x3.  VITAL SIGNS:  Temperature is 98.7, blood pressure 140/71, pulse 77,  respirations 20.  HEENT:  Nontraumatic, normocephalic.  EOMI, PERRLA, sclerae anicteric.  Buccal mucosa is slightly dry.  There is no JVD.  CARDIOVASCULAR:  Regular rate and rhythm with S1 and S2.  PULMONARY:  Clear to A&P.  ABDOMEN:  Soft, nontender, nondistended, bowel sounds are active.  She does  have a loud succussion splash.  There is no mass or hepatosplenomegaly, no  guarding.  RECTAL:  Not done at this time  EXTREMITIES:  Without clubbing, cyanosis, or edema.  Pulses are 1+  bilaterally.   IMPRESSION:  1. An 77 year old white female with recent admission with partial gastric     outlet obstruction secondary to prior pyloric stenosis and new antral     ulcers with significant surrounding edema, now refractory with recurrent     nausea and vomiting, rule out nonhealing of the antral ulcers versus new     stricturing.  2. Other medical problems as outlined above including coronary artery     disease status post stent, renal artery stenosis status post stent, prior     peptic ulcer disease, osteoarthritis, hypertension, hyperlipidemia, and     previous right hip replacement.   PLAN:  The patient is admitted to the service of Dr. Melvia Heaps for IV  fluid hydration, IV Protonix b.i.d.  She will be placed on bowel rest with  sips of clear liquids.  Will try to hold off on NG tube placement given the  significant trauma last time; however, if she continues to vomit we may need  NG placement with fluoroscopy.  Will plan upper endoscopy to reassess on  Monday and then determine whether she can be dilated, place a duodenal  stent, versus surgical management.  For details please see the orders.     Mike Gip, P.A.-C. [facility]                Robert D. Arlyce Dice, M.D. [facility]    AE/MEDQ  D:  01/07/2004  T:  01/08/2004  Job:  [id number]  cc:   Holley Bouche, M.D.  [address]   [place], [place] [address]  Fax: [phone]

## 2010-12-08 NOTE — H&P (Signed)
[name]                          ACCOUNT NO.:  [id number]   MEDICAL RECORD NO.:  [id number]                   PATIENT TYPE:  INP   LOCATION:  0101                                 FACILITY:  [facility]   PHYSICIAN:  Vania Rea. Jarold Motto, M.D. [facility]        DATE OF BIRTH:  [birth year]   DATE OF ADMISSION:  11/26/2003  DATE OF DISCHARGE:                                HISTORY & PHYSICAL   CHIEF COMPLAINT:  Two weeks of nausea and vomiting.   HISTORY:  [name] is a pleasant 77 year old white female well-known to Dr.  Jarold Motto, a primary patient of Dr. Leonides Sake, who does have a history of  peptic ulcer disease and pyloric stenosis.  She last underwent endoscopy in  April of 2003 with finding of a distal esophageal stricture which was  Maloney-dilated.  She also had evidence of gastric retention at that time  and a deformed pyloris with stenosis.  Biopsy of the duodenum was done and  this was negative for evidence of a spure or malignancy.  CLOtest was also  negative.  She had been tried on Reglan at that time but was found to be  intolerant with neurologic side effects.  She has been on chronic Protonix  since and doubled her dose over the past week or so.  The patient relates  that she has had a 2-week history of intermittent nausea and vomiting and  had a couple of episodes of vomiting prior to that time.  She has also had  increased complaints of heartburn and reflux symptoms.  Her daughter says  she has been eating very little and has been vomiting on a daily basis.  She  has no complaints of dysphagia or odynophagia.  She denies any severe  abdominal pain, just complains of discomfort and queasiness as well as a  full bloated sensation.  Her weight is down about 5 pounds over the past  couple of weeks.  She reports normal bowel movements, no melena or  hematochezia.  She has not had any documented fever or chills.  She has been  able to keep her medications down but has not  taken any over the past couple  of days.  She says she had been on Bextra up until about 2 weeks ago after  it was taken off the market.  She also is on aspirin 81 mg daily and Plavix  75 daily for history of coronary artery disease and peripheral vascular  disease.  She has been increasingly weak and dizzy with walking over the  past few days.  She was seen by Dr. Leonides Sake in his office yesterday and  referred for GI evaluation.  At that time, she was noted to be mildly  hypotensive and mildly tachycardic.  Labs drawn on Nov 23, 2003 show a BUN of  30, creatinine of 1.4, WBC of 1.2, hemoglobin of 14.7, and hematocrit of  43.8.  She was seen  by Dr. Jarold Motto in the office this morning and admitted  with concerns for dehydration and gastric outlet obstruction probably  secondary to recurrent ulcer disease and/or stenosis.   CURRENT MEDICATIONS:  1. Plavix 75 mg daily.  2. Aspirin 81 mg daily.  3. Premarin 0.625 mg daily.  4. Zocor 20 mg daily.  5. Protonix 40 mg daily.  6. Diovan/HCTZ 80/12.5 mg daily.  7. Folic acid 1 mg daily.  8. Nu-Iron 150 mg daily.   ALLERGIES:  No known drug allergies.Marland Kitchen   PAST HISTORY:  1. Pertinent for coronary artery disease.  She is status post a stent to the     RCA in December of 2004 per Dr. Alanda Amass. She has renal artery stenosis,     is status post stenting of the right renal artery July 23, 2003.  2. History of peptic ulcer disease with active antral ulcer in 1989.     Pyloric stenosis noted in 2003.  3. Osteoarthritis.  4. Hyperlipidemia.  5. Hypertension.   FAMILY HISTORY:  Pertinent for diabetes, CVAs, and cancer.   SOCIAL HISTORY:  The patient is married, lives with her husband, daughter  lives next door.  She is self-sufficient.  No tobacco, no EtOH.   REVIEW OF SYSTEMS:  CARDIOVASCULAR:  Negative for anginal discomfort.  PULMONARY:  Denies any cough or shortness of breath, or sputum production.  GENITOURINARY:  Denies any  dysuria, urgency,or frequency.  MUSCULOSKELETAL:  Pertinent for arthritic symptoms.  GI:  As above.   PHYSICAL EXAMINATION:  GENERAL:  A well-developed elderly white female in no  acute distress.  She is alert and oriented x3.  VITAL SIGNS:  Respirations 16.  Blood pressure in the office 90/50, pulse in  the 90s.  She is afebrile.  HEENT:  Nontraumatic, normocephalic.  EOMI, PERRLA.  Sclerae anicteric.  Buccal mucosa is dry.  NECK:  There is no JVD or bruit.  CARDIOVASCULAR:  Regular rate and rhythm with S1 & S2.  No murmur, rub, or  gallop.  PULMONARY:  Clear to A&P.  ABDOMEN:  Soft.  Bowel sounds are active.  She is mildly tender in the  epigastrium.  She does have a loud succussion splash.  There is no mass or  hepatosplenomegaly.  RECTAL:  Not done today.  EXTREMITIES:  Without clubbing, cyanosis, or edema.  NEUROLOGICAL:  Nonfocal.   IMPRESSION:  1. An 77 year old white female with a two-week history of nausea, vomiting,     and abdominal discomfort.  Suspect recurrent peptic ulcer disease,     pyloric stenosis, and secondary outlet obstruction.  2. Dehydration with renal insufficiency, dizziness, and weakness.  3. Coronary artery disease status post right coronary artery stent April     2004.  4. Peripheral vascular disease status post right renal artery stent December     2004.  5. Hypertension.  6. Osteoarthritis.  7. Anticoagulation with aspirin and Plavix.   PLAN:  The patient is admitted to the service of Dr. Sheryn Bison for IV  fluid hydration, NG decompression of her stomach with plans for  decompression for 48 hours.  She will be placed on a Protonix infusion given  likelihood  of recurrent ulcer and will plan upper endoscopy Monday or Tuesday and then  decide if dilation of the pyloris is appropriate.  For details, please see  the orders.  Hold aspirin and Plavix in anticipation of procedures during this hospitalization.     [name], P.A.-C. [facility]  Vania Rea. Jarold Motto, M.D. [facility]    AE/MEDQ  D:  11/26/2003  T:  11/26/2003  Job:  [id number]   cc:   Holley Bouche, M.D.  [address]  [place], [place] [address]  Fax: [phone]   Richard A. Alanda Amass, M.D.  [phone] [address], [address]  [place]  [place] [address]  Fax: [phone]

## 2010-12-08 NOTE — Discharge Summary (Signed)
NAMESTEFANEE, [name]              ACCOUNT NO.:  [id number]   MEDICAL RECORD NO.:  [id number]          PATIENT TYPE:  OIB   LOCATION:  6532                         FACILITY:  [facility]   PHYSICIAN:  Richard A. Alanda Amass, M.D.DATE OF BIRTH:  April 20, 1918   DATE OF ADMISSION:  11/13/2005  DATE OF DISCHARGE:  11/14/2005                                 DISCHARGE SUMMARY   ADDENDUM:  Review of her telemetry strip shows sinus bradycardia with rate  of 50.  She did have one pause of 2.5 seconds which gave her a low rate in  the 40s.  She is asymptomatic with bradycardia.  We will defer Holter  monitoring evaluation to Dr. Alanda Amass.      Abelino Derrick, P.A.      Richard A. Alanda Amass, M.D.  Electronically Signed    LKK/MEDQ  D:  11/14/2005  T:  11/14/2005  Job:  [id number]

## 2010-12-08 NOTE — Op Note (Signed)
[name], [name]                          ACCOUNT NO.:  [id number]   MEDICAL RECORD NO.:  [id number]                   PATIENT TYPE:  OIB   LOCATION:  2899                                 FACILITY:  [facility]   PHYSICIAN:  Richard A. Alanda Amass, M.D.          DATE OF BIRTH:  1918-06-28   DATE OF PROCEDURE:  07/22/2003  DATE OF DISCHARGE:                                 OPERATIVE REPORT   PROCEDURE:  Retrograde abdominal aortic catheter, abdominal aortic angiogram  midstream PA projection, iliac angiogram PA projection, selective right  renal angiogram oblique projections hand injection, PTA high grade right  renal artery stenosis and subsequent stent.  Upgraded 6 mm balloon  dilatation, weight-adjusted heparin 3500 units, continue aspirin and Plavix  as an outpatient.   PROCEDURE:  The patient was brought to the sixth floor PV lab in a  postabsorptive state after 5 mg of Valium p.o. premedication.  The right  groin was prepped, draped in the usual manner.  One percent Xylocaine was  used for local anesthesia.  The CRA was entered with a single anterior  puncture using an 18 thin-walled needle and a 6 French short sidearm sheath  was inserted without difficulty.  A J-tip Teflon-coated guide wire was used  to negotiate the iliac system and guide wire exchange was used.  Nonionic  contrast was utilized.  A 5 French pigtail catheter was placed above the  level of the renal arteries and abdominal aortic angiogram was done in the  midstream PA projection and 20 cc per second with DSA imaging.  A second  injection was done above the iliac bifurcation.   Abdominal angiogram demonstrated a patent celiac and SMA axis.  A single  ___________ left renal artery.  The right renal artery had high grade 95%  concentric calcific stenosis in the proximal portion just beyond the ostia.  It was fairly focal stenosis with mild post-stenotic dilatation.   The infrarenal abdominal aorta had mild  atherosclerotic disease and  calcification with no stenosis.   The iliac crest films were tortuous but widely patent with no significant  stenosis.  The common and external iliacs were normal.  The LCFA, left  profunda SFA bifurcation was normal.  The right SFA profunda bifurcation was  normal.  Previous oblique injection showed a normal RCFA (right hip  prosthesis was seen fluoroscopically).   The patient was given weight-adjusted 3500 units of heparin IV.  Subsequent  ACT was 270 seconds.  Arterial pressures were monitored throughout the  procedure.  The patient remained in sinus rhythm.   The right renal artery was intubated with a 6 [place] JL4 short Cordis  guiding catheter.  The high grade stenosis was crossed with a 0.014 inch  Cordis soft tip stabilizer wire, which was free in the distal vessel and  observed throughout the procedure.  The lesion was dilated with 4mm  __________ 15 mm Cordis Aviator balloon 8-30 and  9-30.  Hand injection  demonstrated suboptimal result without dissection.  The lesion was then  stented with a premounted Cordis 5 mm x 12 mm Genesis stent and Aviator  balloon.  This was positioned fluoroscopically to cover the lesion just  inside the ostia where there was a funnel-like nonstenotic opening.  The  stent was deployed at 7-30.  Using exchange technique, the stent was  redilated with a 6 mm x 15 mm upgraded Cordis Aviator balloon at 9-30.  Final injection showed excellent angiographic results with no dissection.  Post stent deployment, the lesion was covered and good apposition of the  stent in the post stenotic area and the ostial area.   The patient was given 20 mg of Labetalol for systemic hypertension with  blood pressure approximately 200/90 mmHg; it came down to 160 mmHg.  Dilatation system was removed, the sidearm sheath was flushed, and the  patient was brought to the holding area for sheath removal and pressure  hemostasis when the ACT  normalizes.  She tolerated the procedure well.   DISCUSSION:  Please see H&P for full details.  [name] is a very  pleasant 77 year old, married mother of three with two grandchildren who is  a nonsmoker.  She has systemic hypertension and recently underwent culprit  lesion dominant right coronary artery PTA and stenting for unstable angina  with essentially single vessel coronary disease (June 24, 2003).  She had  a history of some episodic dizziness and lightheadedness and palpitations as  well and was scheduled to wear an outpatient event monitor and had been seen  in followup as an outpatient and was stable.  She had greater than 95% right  renal artery stenosis with systemic hypertension at diagnostic cath and  intervention June 24, 2003.  Outpatient renal Doppler was performed  demonstrating 474 cm/sec velocity with a renal aortic ratio of 6.3 (July 09, 2003).  The patient was admitted for renal intervention with indications  being renal preservation, severe renal vascular hypertension on multiple  medications with normal creatinine.  Creatinine 1.2 preoperatively.   The patient has had successful right renal artery PTA and stenting.  Would  recommend continued medical therapy.   It should be noted that this patient had a fall approximately ten days prior  to admission.  This occurred when she tripped over a curb.  She developed a  scalp laceration and was seen on [facility] emergency room and was allowed to go  home and had skin staples placed.  She subsequently has developed ecchymoses  on the forehead area.  She has not had any neurologic abnormalities and had  a negative scan in the emergency room.  At the end of the procedure, her  scalp wound was cleaned and her staples were removed with good healing of  the wound.  She remains on outpatient aspirin and Plavix status post  coronary stent and now renal stent.  Will get followup outpatient Dopplers and clinical  followup.   DIAGNOSES:  1. Renal vascular hypertension--high grade right renal artery stenosis,     normal renal function.  2. Successful percutaneous transluminal angioplasty and subsequent stenting,     high grade proximal right renal artery stenosis; normal left renal     artery.  3. Coronary artery disease, single vessel, with proximal right coronary     artery large bare metal stenting (4/15 ZETA) June 24, 2003.  4. Recent fall secondary to tripping with scalp laceration.  No neurologic  abnormalities.  Staples removed today.  5. History of palpitations, currently on an event monitor.  6. Gastroesophageal reflux disease.  7. Hyperlipidemia.  8. Right total hip replacement October 2003, Dr. Despina Hick.                                               Richard A. Alanda Amass, M.D.    RAW/MEDQ  D:  07/22/2003  T:  07/22/2003  Job:  [id number]   cc:   Ellin Saba., M.D.  [address] [place]  [place] [address]  Fax: [phone]   Sixth Floor PV Lab

## 2010-12-08 NOTE — Op Note (Signed)
[name], [name] NO.:  [id number]   MEDICAL RECORD NO.:  [id number]                   PATIENT TYPE:  INP   LOCATION:  0162                                 FACILITY:  [facility]   PHYSICIAN:  Diane Pancoast. Zachery Odom, M.D.          DATE OF BIRTH:  05-05-1918   DATE OF PROCEDURE:  01/13/2004  DATE OF DISCHARGE:                                 OPERATIVE REPORT   PREOPERATIVE DIAGNOSES:  Chronic peptic ulcer disease with gastric outlet  obstruction.   POSTOPERATIVE DIAGNOSES:   OPERATION:  Vagotomy, bilateral __________ and pyloroplasty, Heineke-  Mikulicz.   ANESTHESIA:  General.   SURGEON:  Diane Pancoast. Zachery Odom, M.D.   ASSISTANT:  Diane Mould. Derrell Odom, M.D.   HISTORY:  [name] is an 77 year old Caucasian female who has a  documented 15 year history of peptic ulcer disease and has been on multiple  H2 blockers and etc.  Recently she has had three admissions for basically  gastric outlet obstruction, repeat endoscopies, medical management,  dilatations I think one or two occasions and then a repeat endoscopy on  Monday was shown by Dr. Leone Odom to have a predominantly nearly gastric  outlet obstruction. She had a barium swallow the next day that confirmed the  same thing. She has never problems actually of hemorrhage but I was asked to  see her. She has not really had significant pain and I recommended that she  probably needed surgery and I would think that with her age of 76 and  cardiac history that probably vagotomy pyloroplasty hopefully would be  adequate and not planning on the antrectomy. The medical doctors were in  agreement with this.  [name] did see her preoperatively and  permission was obtained and the OR scheduled for this afternoon. She has  been n.p.o. after midnight and has been kind of on sips over the last couple  of days.   The patient was positioned on the OR table, induction of general anesthesia,  endotracheal tube,  NG tube and a Foley catheter inserted sterilely. She has  PAS stockings and she had received 3 g of Unasyn immediately preoperatively.  The patient was draped after prepping and a midline epigastric incision was  made. She had more adipose tissue than I had appreciated on clinical  examination and we identified the fascia and kind of carefully opened into  the peritoneal cavity. She does have kind of a prominent stomach, the left  and right lobes of the liver looked normal, there were some adhesions up  around the duodenum and gallbladder but I think this is related to the  chronic peptic ulcer disease as we could not feel any stones.  The Thompson  retractor was first placed and then retracting up in the epigastric area, I  mobilized the left lobe of the liver so I could then encompass the GE  junction with blunt finger dissection. I identified a fairly large  posterior  vagus and also the anterior vagus and I doubly clipped each proximally,  singly, distally and the segment was sent and confirmed to be peripheral  large nerves by [name].  We had proceeded on and mobilizing kind of a  mild kocherization of the duodenum and identified what we thought was the  pylorus.  Everything is kind of scarred in this area and exactly where the  pylorus is was a little bit in question.  I opened the area carefully in the  most distal stomach area and there was definitely a stricture there and this  was opened inferiorly.  It appeared to be a little longer stricture than  just the pyloric area only and then after probably dividing an area about 2  cm in length we could definitely open on up into the second portion of the  duodenum. There was nothing that was a frank active ulcer that we could see  but just a lot of chronic scarring, etc.  A single layer Heineke-Mikulicz  pyloroplasty was performed with Gambee sutures of 3-0 silk and the area has  an adequate opening at the end.  I then reinserted the  NG tube that had been  pulled back when we were doing the vagotomy and distended the stomach, there  was no evidence of any leakage where the pyloroplasty was done.  The tube  was positioned properly in the stomach and then the stomach decompressed.  I  did place a little bit of the omentum that was kind of laying in the area  over the pyloroplasty and then sutured this down with a couple of sutures of  3-0 silk.  The area where we had done the vagotomy there was no evidence of  any bleeding, the area was irrigated, there had been just a little bit of  gastric juice spillage at the time of the pyloroplasty and we changed gloves  and aspirated this irrigation fluid.  I then closed the linea alba with  interrupted sutures of #0 Prolene and the skin itself was closed with  staples. I did not place a gastrostomy tube and hopefully she will open up  fairly quickly. I do think that since she has had this problem with kind of  a gastric outlet obstruction and some weight loss that probably a PICC line  and start her on TPN she will be __________ until she is actually able to  eat.  We will have a PICC line and start the TPN tomorrow. I am going to  keep her on Unasyn for probably a couple of days because of the little bit  of soilage and chronically obstructed stomach.                                               Diane Pancoast. Zachery Odom, M.D.    WJW/MEDQ  D:  01/13/2004  T:  01/14/2004  Job:  [id number]   cc:   [name], M.D. [facility]

## 2010-12-08 NOTE — Discharge Summary (Signed)
NAMEXZARIA, [name]                          ACCOUNT NO.:  [id number]   MEDICAL RECORD NO.:  [id number]                   PATIENT TYPE:  OIB   LOCATION:  5511                                 FACILITY:  [facility]   PHYSICIAN:  Richard A. Alanda Amass, M.D.          DATE OF BIRTH:  [birth year]   DATE OF ADMISSION:  DATE OF DISCHARGE:  07/23/2003                                 DISCHARGE SUMMARY   DISCHARGE DIAGNOSES:  1. Peripheral vascular disease, status post elective right renal artery     stenting this admission.  2. History of coronary disease with recent right coronary artery stent     June 24, 2003.  3. Bradycardia, patient was not sent home on beta-blocker.  4. History of dizziness, unclear etiology.  5. Gastroesophageal reflux.  6. Treated hypertension.   HOSPITAL COURSE:  The patient is an 77 year old female who was hospitalized  by Dr. Alanda Amass recently and had an RCA stent.  At that time, she was found  to have a 99% right renal artery stenosis.  She saw Dr. Alanda Amass July 01, 2003.  The patient was admitted for elective intervention to the right  renal artery.   The patient was admitted July 22, 2003 and underwent renal angiogram  with elective right renal artery stenting with good results.  Left renal  artery was patent, as were iliacs.  The patient tolerated this well.  She  was noted to be a little anemic with a hemoglobin of 9.9.  The patient was  discharged on iron.  Her potassium was low on the morning of discharge at  2.8, and this was repleted.  Her beta-blocker was held because of  bradycardia.   She had had a fall recently with a laceration on the top of her scalp and  had a staple in place, which Dr. Alanda Amass removed prior to discharge.   She will have follow up Dopplers to her renal artery and an event monitor.   DISCHARGE MEDICATIONS:  1. Iron 150 daily.  2. Aspirin 81 mg a day.  3. Folic acid 1 mg a day.  4. Plavix 75 mg daily.  5.  Diovan/HCTZ 80/12.5 daily.  6. Protonix 40 mg daily.  7. Bextra 10 mg daily.  8. Zocor 20 mg a day - added.  9. Premarin 0.625 mg at home.   LABORATORY DATA:  Discharge white count 5.2, hemoglobin 9.9, hematocrit  29.6, platelets 134,000.  Follow up BMP is pending.  Creatinine was normal,  but potassium was 2.8 earlier.   DISPOSITION:  The patient will be discharged on December 31st, pending  follow up BMP.  The office will contact her regarding right renal artery  Dopplers and event monitor.      Abelino Derrick, P.A.                      Richard A. Alanda Amass, M.D.    Lenard Lance  D:  07/23/2003  T:  07/23/2003  Job:  [id number]   cc:   Ellin Saba., M.D.  [address] [place]  [place] [address]  Fax: [phone]

## 2010-12-08 NOTE — Discharge Summary (Signed)
[name], ESKIN NO.:  [id number]   MEDICAL RECORD NO.:  [id number]          PATIENT TYPE:  INP   LOCATION:  2029                         FACILITY:  [facility]   PHYSICIAN:  Richard A. Alanda Amass, M.D.DATE OF BIRTH:  02-Jul-1918   DATE OF ADMISSION:  01/13/2009  DATE OF DISCHARGE:  01/19/2009                               DISCHARGE SUMMARY   DISCHARGE DIAGNOSES:  1. Chest pain, no myocardial infarction.  No progressive coronary      artery disease status post cardiac catheterization January 17, 2009.  2. Known coronary artery disease with history of stent to his right      coronary artery in 2004, catheterization in 2007 showed high-grade      bifurcation left anterior descending diagonal disease.  She had      Cypher stenting with a 2.5 x 18 in the left anterior descending and      a percutaneous transluminal coronary angioplasty to her diagonal.      This admission she underwent cardiac catheterization by Dr.      Nanetta Odom and both her stents were patent.  She had no other      obstructive or nonobstructive disease.  3. Gastroesophageal reflux disease.  This is felt to be because of her      chest pain problems.  4. Atherosclerotic cardiovascular disease with history of right renal      artery stenosis.  5. Normal ejection fraction.  6. Hypertension.  7. Musculoskeletal pain which is also part of her chest pain problems.   LABORATORY DATA:  Hemoglobin 11.3, hematocrit 34.4, WBC 7.8 and  platelets 146.  Sodium 143, potassium 3.6, BUN 20, creatinine 0.98, AST  23, ALT 23, total protein 7, CK-MBs and troponins were negative x2 and  her BNP was 215.  Chest x-ray showed no acute findings.  KUB showed no  acute findings.   DISCHARGE MEDICATIONS:  1. Estradiol 0.5 mg daily.  2. Metoclopramide 5 mg as needed.  3. Protonix 40 mg twice per day.  4. Simvastatin 20 mg daily.  5. Plavix 75 mg a day.  6. Diovan 80 mg a day.  7. Folic acid 1 mg a day.  8. Meloxicam  15 mg as needed p.r.n.  9. Aspirin 81 mg a day.  10.Multivitamin everyday.  11.Calcium 500 mg daily.  12.Lopressor 25 mg daily with metoprolol succinate.  13.Colace 200 mg a day.  14.Temazepam 15 mg at bedtime p.r.n.  15.Xanax 0.25 mg p.r.n.   HOSPITAL COURSE:  [name] came into the ER with complaints of chest  pain.  She was seen by Dr. Garen Odom and admitted to rule out an MI and  for further cardiac evaluation.  She was started on IV nitroglycerin for  blood pressure and chest pain control.  Her blood pressure on admission  was 202/78.  She was seen by Dr. Alanda Amass the following day which is  her primary cardiologist.  He wanted to get a KUB to assess for splenic  flexure and treat her blood pressure empirically.  She was seen by  cardiac rehab.  She was walked  in the hall.  She still continued to have  chest pain and left neck and shoulder pain.  She was seen by Dr. Allyson Odom.  He was worried that this may be recurrent angina and it was decided she  should undergo cardiac catheterization.  This was performed on January 17, 2009.  She had a normal LV.  Her two stents were patent one in her LAD  and one in her RCA.  She had no other disease.  He felt it was reflux  symptoms.  She did have some problems with some bradycardia and this  resolved.  She was seen by cardiac rehab.  She walked in the hall.  Her  blood pressure was stable and actually slightly high.  Her O2 sats were  stable with walking.  She was considered stable for discharge home on  January 19, 2009.  Her blood pressure was 137/57, heart rate was 102 sinus  rhythm, O2 sats were 96%.  She is planned to follow up as an outpatient  with Dr. Susa Odom on February 14, 2009 at 12 noon.      [name], N.P.      Richard A. Alanda Amass, M.D.  Electronically Signed    BB/MEDQ  D:  02/15/2009  T:  02/16/2009  Job:  [id number]

## 2010-12-08 NOTE — H&P (Signed)
[name], [name] NO.:  [id number]   MEDICAL RECORD NO.:  [id number]                   PATIENT TYPE:  INP   LOCATION:                                       FACILITY:  [facility]   PHYSICIAN:  Thereasa Solo. Little, M.D.              DATE OF BIRTH:  1917/09/03   DATE OF ADMISSION:  06/23/2003  DATE OF DISCHARGE:                                HISTORY & PHYSICAL   CHIEF COMPLAINT:  My pulse is fine, I'm dizzy.   HISTORY OF PRESENT ILLNESS:  77 year-old white married female with  no prior history of significant bradycardia or dizziness. Called our office  today with complaints of being dizzy and a heart rate in the 40s.  The  patient has a history of premature beats, PACs, and takes 12.5 of Toprol  when these occur on a p.r.n. basis.  Usually the episodes occur and she has  to take the Toprol maybe twice a month.  On this past Monday, June 21, 2003, she had palpitations in the morning and took 12.5 of Toprol and then  around 8 p.m. on Monday evening she took another 12.5 of Toprol.  Yesterday  her heart rate was in the 40s, and even slower she states.  She was getting  more dizzy in the head than usual and it was one of the worst episodes of  palpitations she had had on Monday, that is why she took two half tablets.   Today her pulse continues to be in the 40s, continues to be dizzy.  She also  has some chest pressure.  She has the pressure periodically, but that too  has increased somewhat this week.   PAST MEDICAL HISTORY:  She has had a Cardiolite study in October of 2003  that was negative for ischemia, ejection fraction was 76% and there was no  significant change from a prior study in May of 2001.  Other history  includes some hypertension, otherwise she is fairly stable.  She did have a  hip replacement last year by Dr. Lequita Halt and did well.  She also has a  history of peptic ulcers, gastritis, reflux disease, hyperlipidemia and SVT  with  PACs also.   SURGICAL HISTORY:  Includes:  1. Tonsillectomy.  2. Multiple EGDs.  3. Colonoscopy.   Please note in 2001 she was seen for tachy palpitations.  She was seen by  Dr. Lewayne Bunting at that time, wore a monitor for 24 hours which was  negative.  She was placed on Toprol for her palpitations and then she was  seen back in May of 2001.  At that point, Dr. Scotty Court had discontinued her  Toprol secondary to heart rates 48 to 55.   REVIEW OF SYSTEMS:  GENERAL:  Decreased heart rate, feeling weak and dizzy.  SKIN:  Negative.  EYES:  Wears glasses.  RESPIRATIONS:  No  shortness of  breath.  CARDIOVASCULAR:  Chest heaviness, no pain.  Just a heaviness that  comes and goes.  GI:  No diarrhea, constipation or melena.  GU:  No  hematuria.  NEURO:  Dizziness and lightheadedness at times.  Has felt like  she would pass out in the last couple of days.  ENDOCRINE:  No diabetes or  thyroid disease.   ALLERGIES:  NO KNOWN ALLERGIES.   CURRENT MEDICATIONS:  1. Hydroxyzine 10 mg p.r.n.  2. Bextra 10 mg b.i.d.  3. Protonix 40 mg daily.  4. Diovan HCT 80/12.5 daily.  5. Menest 0.625 one daily.  6. Quinine sulfate 325 p.r.n.  7. Toprol XL 25 one-half p.r.n.  8. Aspirin 325 daily.   SOCIAL HISTORY:  She is married.  Three children, two grandchildren.  Does  not use tobacco.  Does not exercise but she is active.   FAMILY HISTORY:  One brother has had a heart attack, other brothers have had  heart disease, hypertension, and her parents died of a stroke.   PHYSICAL EXAMINATION:  VITAL SIGNS TODAY:  Blood pressure in the left arm  156/70, in the right 156/70, weight 159, height 5 feet 4 inches.  Heart rate  initially on the EKG was 45, when rechecked it was 60 and then back down to  46.  EKG today sinus bradycardia, inferior Q-waves which were old.  Heart  rate is 46.  No acute changes otherwise except decreased heart rate.  On  May 17, 2003, heart rate was 69 b.p.m. with PACs.   GENERAL:  Alert, oriented white female in no acute distress.  SKIN:  Warm and dry.  Brisk capillary refill.  SCLERA:  Clear.  NECK:  Supple.  No JVD, no bruits, no thyromegaly.  HEART SOUNDS:  S1, S2.  Regular rate and rhythm.  Soft systolic murmur, 2/6  aortic outflow murmur.  ABDOMEN:  Soft, nontender.  Positive bowel sounds.  Do not palpate liver,  spleen or masses.  LOWER EXTREMITIES:  Without edema.  Pedal pulses present.  2+ radials  bilaterally.   ASSESSMENT:  1. Bradycardia, symptomatic, with dizziness, near syncope.  2. Chest pressure, rule out myocardial infarction, rule out cardiac issues.  3. Hypertension.   PLAN:  Dr. Clarene Duke saw her and assessed her with me.  Will admit her to [facility] secondary to near syncope and her age, for closer monitoring and  planned cardiac cath by Dr. Alanda Amass tomorrow.  Hopefully if all is stable  and her heart rate picks up, she will be able to return home soon.  But it  has been 48 hours after her last dose of Toprol and her heart rate continues  to be in the 40s.  She will be admitted to a telemetry bed and cardiac labs  done.      Darcella Gasman. [place], N.P.                     Thereasa Solo. Little, M.D.    LRI/MEDQ  D:  06/23/2003  T:  06/23/2003  Job:  [id number]   cc:   Thereasa Solo. Little, M.D.  [address]  [address]  [place]  [place] [address]  Fax: [phone]   Richard A. Alanda Amass, M.D.  [phone]  [place]  [place] [address]  Fax: [phone]   Ellin Saba., M.D.  [address] [place]  [place] [address]  Fax: [phone]

## 2010-12-08 NOTE — Discharge Summary (Signed)
[name], [name]              ACCOUNT NO.:  [id number]   MEDICAL RECORD NO.:  [id number]          PATIENT TYPE:  OIB   LOCATION:  6532                         FACILITY:  [facility]   PHYSICIAN:  Richard A. Alanda Amass, M.D.DATE OF BIRTH:  [birth year]   DATE OF ADMISSION:  11/13/2005  DATE OF DISCHARGE:  11/14/2005                                 DISCHARGE SUMMARY   DISCHARGE DIAGNOSIS:  1.  Unstable angina, left anterior descending Cypher stenting this      admission.  2.  Known coronary disease with previous right coronary artery stent that      was patent at this admission.  3.  Good left ventricular function.  4.  Hypertension with no significant renal artery stenosis at      catheterization.  5.  Prior right renal artery PTA and stenting in 2004.  6.  Degenerative joint disease.  7.  Previous esophageal stricture and vagotomy.  8.  History of bradycardia.   HOSPITAL COURSE:  The patient is a delightful 77 year old female followed by  Dr. Alanda Amass.  She has known coronary disease and has had a previous RCA  stent.  She has had peripheral vascular disease with prior right renal  artery stenting.  She is admitted for elective catheterization after she  presented Dr. Kandis Cocking office complaining of chest pain which was  worrisome for unstable angina.  Catheterization was done November 13, 2005,  which revealed a patent RCA stent site, normal left main, normal circumflex,  and 85% LAD proximal to the second diagonal.  The patient underwent  intervention to the LAD and second diagonal using Cypher stents.  She  tolerated this well.  Dr. Alanda Amass feels she can be discharged November 14, 2005.  We did stop her nitrate.   DISCHARGE MEDICATIONS:  Coated aspirin once a day, Plavix 75 mg a day,  Protonix 40 mg a day, Diovan/HCTZ 320/25 daily, simvastatin 20 mg a day,  Menest 0.625 mg a day, folic acid 1 mg a day, nitroglycerin sublingual  p.r.n.   LABORATORY DATA:  Sodium 139, potassium  4.3, BUN 15, creatinine 1.3.  EKG  shows sinus rhythm, sinus bradycardia, she did have some slow rates down  briefly into the 40s. White count 7.3, hemoglobin 10.8, hematocrit 31.1,  platelets 168.  CK and troponins were negative.   DISPOSITION:  The patient is discharged stable condition.  She will have an  outpatient Holter monitor.  She will follow up with Dr. Alanda Amass in a  couple of weeks in the office.     Abelino Derrick, P.A.      Richard A. Alanda Amass, M.D.  Electronically Signed   LKK/MEDQ  D:  11/14/2005  T:  11/14/2005  Job:  [id number]

## 2010-12-08 NOTE — Discharge Summary (Signed)
[name], [name] NO.:  [id number]   MEDICAL RECORD NO.:  [id number]                   PATIENT TYPE:  INP   LOCATION:  0349                                 FACILITY:  [facility]   PHYSICIAN:  Anselm Pancoast. Zachery Dakins, M.D.          DATE OF BIRTH:  [birth year]   DATE OF ADMISSION:  01/07/2004  DATE OF DISCHARGE:  01/21/2004                                 DISCHARGE SUMMARY   DISCHARGING DIAGNOSES:  1. Gastric outlet obstruction secondary to chronic peptic ulcer disease.  2. Mild hypertension on hydrochlorothiazide.   OPERATIONS:  1. Upper endoscopy.  2. Vagotomy and pyloroplasty.   HISTORY:  [name] is an 77 year old Caucasian female, primary  physician is Leonides Sake and is known to Dr. Sheryn Bison and Yancey Flemings  for symptoms of multiple endoscopies and dilatation for kind of a chronic  peptic ulcer disease.  She said she is known to have peptic ulcers for about  15 years and has a chronic problem of kind of arthritis for which she has  been on Celebrex and the other type of arthritis medications and she has  also had known peptic ulcer disease.  Over the last 3-4 months she has had  repeated endoscopy and dilatations trying to prevent a gastric outlet  obstruction and she has not had really problems with bleeding or any obvious  large ulcers but as far on the dilatations they have not been successful.  She has got a history of mild hypertension and I think has had a cardiac  catheterization and stent but is really quite functional and after she was  admitted and upper endoscopy performed which showed basically a nearly  obstructed pyloris and no attempt with repeat dilatation was performed since  they thought it would be unsuccessful.  She then had an upper GI that showed  nearly a complete gastric outlet obstruction with very dilated stomach and a  surgical consultation was obtained.  I discussed with the patient that I  thought a vagotomy  and pyloroplasty would be the procedure of choice, as far  as an antrectomy with not an active ulcer noted probably is a little  excessive and she is 77 years old.  The patient was in agreement with this  and we had Dr. Alanda Amass who is her cardiologist to see her preoperatively  and he thought that she could tolerate a procedure.  Therefore, she was  taken to surgery 1 week ago and under general anesthesia and Dr. Derrell Lolling  assisting a bilateral truncal vagotomy and a pyloroplasty __________ was  performed.  She had kind of a chronic narrowing of about an inch long at the  pyloris proximal portion of the duodenum but we cannot see a real active  ulcer at this time.  Postoperatively she had an NG tube for approximately 4  days which she tolerated reasonably well, had no problems with arrhythmia or  hypoxia, the path  report confirmed that a posterior and anterior vagus nerve  were removed and then the NG tube was removed and we then started her on a  liquid diet which she has tolerated without nausea or vomiting.  I did start  her on TPN immediately after surgery since she had been in the hospital  about 10 days prior to her surgery and had been on kind of marginal p.o.  intake.  The patient is now 8 days after surgery doing nicely, incision is  healing nicely, the staples have been removed and she is ready for discharge  in improved condition.  She will have Darvocet-N 100 for pain.  I instructed  her to kind of still kind of small feedings with kind of a soft diet and  kind of gradually advance this.  She has got some I think Protonix that she  is taking that she can continue but when they are completed I do not think  it is necessary for her to continue on those.  Her other chronic medications  will be continued and they are Plavix 75 mg daily, Premarin 0.625, Zocor 20  mg, hydrochlorothiazide, Diovan 80/12.5 and  folic acid and iron.  She has Darvocet-N 100 for pain and if she does resume   the Celebrex was encouraged to make sure that she has got food when she is  taking it.  Patient's staples have been removed and the wound Steri-  Stripped.  Her last lab studies were on January 19, 2004 at which time her  electrolytes were normal, creatinine of 0.9, BUN of 18.                                               Anselm Pancoast. Zachery Dakins, M.D.    WJW/MEDQ  D:  01/21/2004  T:  01/21/2004  Job:  [id number]   cc:   Vania Rea. Jarold Motto, M.D. [facility]   Richard A. Alanda Amass, M.D.  [phone] [address]  [place]  [place] [address]  Fax: [phone]   Holley Bouche, M.D.  [address]  [place], [place] [address]  Fax: [phone]

## 2010-12-08 NOTE — H&P (Signed)
[name], [name]                          ACCOUNT NO.:  [id number]   MEDICAL RECORD NO.:  [id number]                   PATIENT TYPE:  INP   LOCATION:  0465                                 FACILITY:  [facility]   PHYSICIAN:  [name], M.D.                 DATE OF BIRTH:  1917-12-02   DATE OF ADMISSION:  06/08/2002  DATE OF DISCHARGE:                                HISTORY & PHYSICAL   CHIEF COMPLAINT:  Right hip pain.   HISTORY OF PRESENT ILLNESS:  The patient is an 77 year old female who has  been seen in consultation by Dr. Ollen Odom for ongoing right hip pain.  The pain has been increasing for several months now.  It started to radiate  down from the groin toward the right knee.  The patient states it feels like  it wants to give out at times.  She does have significant pain at night, and  it has definitely slowed her down.  She has been seen in the office where x-  rays demonstrate severe end-stage osteoarthritis.  It is felt the patient  has reached a point where she could benefit from undergoing a total hip  replacement arthroplasty.  The risks and benefits of the procedure have been  discussed with the patient, and she has elected to proceed with surgery.   ALLERGIES:  No known drug allergies.   CURRENT MEDICATIONS:  1. Vioxx 25 mg daily.  2. Diovan/hydrochlorothiazide 80/12.5 mg daily.  3. __________ 0.625 mg daily for days 1-25.  4. Protonix 40 mg daily.  5. Hydroxyzine, the patient did not know the mg.  She takes 1 in the     morning, 2 in the evening.  6. Aspirin 325 mg daily.  7. Multivitamin.   She has stopped the aspirin and Vioxx prior to her surgery.   PAST MEDICAL HISTORY:  1. Hypertension.  2. History of peptic ulcers.  3. Gastritis.  4. Reflux disease.  5. History of irregular heart rate.  6. Hyperlipidemia.  7. History of supraventricular tachycardia with PACs.   PAST SURGICAL HISTORY:  1. Tonsillectomy.  2. Multiple EGDs.  3.  Colonoscopy.  4. Cardiac catheterization.   FAMILY HISTORY:  Mother deceased, age 58, with a history of ministrokes and  also hypertension.  Father deceased, age 55, with a history of stroke and  heart trouble.   SOCIAL HISTORY:  She is married.  Has three children living.  Denies use of  tobacco products or alcohol products.  She does have a ramp entering into  her home.   REVIEW OF SYSTEMS:  GENERAL:  No fevers, chills, night sweats.  NEUROLOGIC:  No seizures, headaches, paralysis.  RESPIRATORY:  No shortness of breath,  productive cough, hemoptysis.  CARDIOVASCULAR:  No chest pain, no angina,  orthopnea.  GASTROINTESTINAL:  She does have some constipation for which she  uses over-the-counter medications.  No nausea,  vomiting, diarrhea.  No blood  or mucous in the stool.  GENITOURINARY:  No dysuria, hematuria, discharge.  MUSCULOSKELETAL:  Pertinent to that of the right hip found in the history of  present illness.   PHYSICAL EXAMINATION:  VITAL SIGNS:  Pulse 64, respirations 12, blood  pressure 120/74.  GENERAL:  The patient is an 77 year old white female.  Well-nourished, well-  developed.  Appears to be in no acute distress.  HEENT:  Normocephalic, atraumatic.  Pupils round and reactive.  EOMs intact.  She does have upper and lower dentures noted.  NECK:  Supple.  CHEST:  Clear to auscultation anterior and posterior chest walls.  No  rhonchi, rales, or wheezing.  HEART:  Regular rate and rhythm.  No murmurs.  S1, S2 noted.  ABDOMEN:  Soft and nontender abdomen.  Bowel sounds present x 4.  RECTAL, BREASTS, GENITOURINARY:  Not done.  Not pertinent to present  illness.  EXTREMITIES:  Limited to that of the right lower extremity.  Hip flexion,  approximately 75%.  No internal or external rotation.  She can only abduct  the leg approximately 30 degrees.  She does ambulate with somewhat of an  antalgic gait.   LABORATORY DATA:  X-rays show severe end-stage osteoarthritis of the  right  hip.   IMPRESSION:  1. Osteoarthritis right hip.  2. Hypertension.  3. History of gastritis.  4. Gastroesophageal reflux disease.  5. History of peptic ulcers.  6. History of supraventricular tachycardia with premature atrial     contractions.  7. Hyperlipidemia.    PLAN:  The patient will be admitted to [facility] to undergo right  total hip replacement arthroplasty.  Her cardiologist is Dr. Susa Griffins.  Dr. Alanda Amass has seen the patient preoperatively and felt she  is clinically stable to undergo hip replacement.  Dr. Alanda Amass will be  notified of the room number on admission and will be consulted if needed for  any medical assistance with this patient throughout the hospital course.     Alexzandrew L. Julien Girt, P.A.              [name], M.D.    ALP/MEDQ  D:  06/10/2002  T:  06/11/2002  Job:  [id number]   cc:   Ellin Saba., M.D.   Richard A. Alanda Amass, M.D.  [phone] [address], [address]  [place]  [place] [address]  Fax: [phone]

## 2010-12-08 NOTE — Discharge Summary (Signed)
NAMEKURSTEN, KRUK                          ACCOUNT NO.:  [id number]   MEDICAL RECORD NO.:  [id number]                   PATIENT TYPE:  INP   LOCATION:  0444                                 FACILITY:  [facility]   PHYSICIAN:  Wilhemina Bonito. Marina Goodell, M.D. [facility]             DATE OF BIRTH:  1917-08-08   DATE OF ADMISSION:  11/26/2003  DATE OF DISCHARGE:  12/01/2003                                 DISCHARGE SUMMARY   DISCHARGE DIAGNOSES:  1. An 77 year old female with a two-week history of nausea, vomiting, and     abdominal discomfort, rule out recurrent peptic ulcer disease, pyloric     stenosis, and/or secondary gastric outlet obstruction.  2. Dehydration with renal insufficiency, dizziness, and weakness.  3. Coronary artery disease, status post right coronary artery stent, April,     2004.  4. Peripheral vascular disease, status post right renal artery stenting in     December, 2004.  5. Hypertension.  6. Osteoarthritis.  7. Anticoagulation with aspirin and Plavix.   DISCHARGE DIAGNOSIS:  1. Partial gastric outlet obstruction secondary to multiple antral ulcers     with edema and pinpoint pylorus, felt secondary to scarring from previous     ulcer disease.  Symptomatically improved.  2. Dehydration, resolved.  3. Renal insufficiency secondary to prerenal azotemia, resolved.  4. Mild anemia, normocytic.   CONSULTATIONS:  None.   PROCEDURE:  Upper endoscopy by Dr. Marina Goodell.   BRIEF HISTORY:  [name] is a pleasant 77 year old white female known to Dr.  Jarold Motto and also known to Dr. Leonides Sake with a previous history of  peptic ulcer disease and pyloric stenosis.  She last had an endoscopy in  April, 2003, at that time with a distal esophageal stricture, which was  dilated, and did have evidence of gastric retention and a deformed pylorus  with stenosis.  Biopsy of the duodenum was done and was negative at that  time.  CLO testing has also been negative.  She had been tried on Reglan but  was felt to be intolerant secondary to neurologic side effects and had been  on chronic Protonix over the past two years.  Recently, she has been having  increased abdominal discomfort and nausea and vomiting.  Had doubled up on  her Protonix.  Her daughter reports that she has been eating very little and  had been vomiting on an almost daily basis.  She has had no recurrent  complaint of dysphagia or odynophagia.  Denies any severe abdominal pain.  Complains of primarily wheeziness and a full, bloated sensation.  She has  lost 5 pounds over the past couple of weeks.  She had been on Bextra up  until two weeks prior to this admission, when it was taken off the market.  She had also been aspirin 81 mg daily and Plavix 75 mg daily for a history  of coronary disease and peripheral vascular disease.  She  had been seen by  Dr. Tiburcio Pea in his office on the day prior to admission and referred to GI.  She was noted at that time to be mildly hypotensive and mildly tachycardic.  Labs drawn on Nov 23, 2003 showed a BUN of 30 and a creatinine of 1.4.  WBC  12, hemoglobin 14.7.  She was seen by Dr. Jarold Motto today and felt to be  dehydrated and also concerned with gastric outlet obstruction.  Therefore  was admitted for volume repletion and further diagnosed evaluation.   LABORATORY STUDIES:  On admission, WBC 9.8, hemoglobin 14.1, hematocrit  41.6, MCV 91.7, platelets 184.  Serial values were done on May 10:  WBC 6.1,  hemoglobin 11.5, hematocrit 33.7, platelets 135.  Pro time 12.5, INR 0.9,  PTT 22.  On admission, potassium 2.9, glucose 116, BUN 63, creatinine 2.5,  albumin 4.1.  Follow-up on May 8:  Potassium 4.1, BUN 28, creatinine 1.6.  Follow-up on May 10:  BUN 5, creatinine 1.1.  Liver function studies were  normal.  Urinalysis was negative.   X-RAY STUDIES:  Abdominal films on admission showed air/fluid levels in the  colon.  No evidence for obstruction.   HOSPITAL COURSE:  Patient was admitted  to the service of Dr. Arlyce Dice, who was  covering the hospital.  An NG was placed for gastric decompression.  She was  started on IV Protonix and IV fluids at 125 cc/hr.  Abdominal films were  done and were unremarkable.  She had difficult insertion of NG tube on the  floor, which was unsuccessful after a couple of attempts and thereafter  started spitting up some bright red blood.  She remained hemodynamically  stable and was felt to have probably had an abrasion with bleeding while on  aspirin and Plavix.  We then decided to forego the NG tube.  Her bleeding  subsided, though she did have a slight drop in her hemoglobin.   On May 7, she was started on a clear-liquid diet.  She was able to tolerate  this without difficulty and was scheduled for an upper endoscopy on the 9th  with Dr. Marina Goodell.  This did show evidence of multiple antral ulcers and edema,  causing partial gastric outlet obstruction.  She was noted to have a  pinpoint pylorus as well.  There was too much edema to consider dilation.  Biopsies were not taken.   Her renal insufficiency at that time had resolved, as had her hypokalemia.  We began advancing her diet.  She seemed to tolerate full liquids without  any difficulty.  On May 11, was allowed discharge to home with instructions  to remain on a full-liquid diet for the next few days and if tolerated, to  gradually advance to a very low residue diet.  She was also asked to use  Boost or Ensure supplements once daily to help maintain her weight.  She was  to hold her aspirin and to resume her Plavix post discharge.  She was to  avoid all NSAIDs and COX-2 inhibitors and to use Tylenol as needed for her  arthritis.  We increased her Protonix to 40 mg p.o. b.i.d.  She was to  continue her Diovan, Zocor, folic acid, Nu-Iron, and Premarin as previous.  Follow up with Dr. Jarold Motto in the office on May 20th at 11:00 a.m.  To call for any problems in the interim.  It was felt that she  would need a  follow-up EGD in 4-6 weeks.  [name], P.A.-C. [facility]                John N. Marina Goodell, M.D. [facility]    AE/MEDQ  D:  12/23/2003  T:  12/24/2003  Job:  [id number]   cc:   Holley Bouche, M.D.  [address]  [place], [place] [address]  Fax: [phone]

## 2010-12-08 NOTE — Consult Note (Signed)
NAMEGALAXY, BORDEN NO.:  [id number]   MEDICAL RECORD NO.:  [id number]                   PATIENT TYPE:  INP   LOCATION:  0463                                 FACILITY:  [facility]   PHYSICIAN:  Anselm Pancoast. Zachery Dakins, M.D.          DATE OF BIRTH:  [birth year]   DATE OF CONSULTATION:  01/10/2004  DATE OF DISCHARGE:                                   CONSULTATION   CHIEF COMPLAINT:  Nausea and vomiting with a chronic history of peptic ulcer  disease.   HISTORY:  [name] is an 77 year old Caucasian female who is admitted  today to Dr. Marvell Fuller service for an approximately four-day history of  nausea and vomiting and a progression of her chronic peptic ulcer disease.  She has had ulcers for, she says, 15 years, and Dr. Sheryn Bison and Dr.  Yancey Flemings have had her in the hospital in the last six weeks on two  occasions with partial gastric outlet obstruction.  She was not dilated  during that admission, and this past Thursday started having vomiting again.  She says she has not actually lost a significant amount of weight, maybe 2-3  pounds in this interim, and has been on peptic ulcer medical management.  She has a history of peripheral vascular problems and a coronary artery  stent.  Is on Plavix and also aspirin.  Was admitted and an endoscopy  performed to date.  Dr. Leone Payor was not able to dilate her because of the  findings.  She does have some ulcerations in the distal antrum and a very  small pylorus.  I was asked to see her.  She does not smoke cigarettes.  States that she has known that she has had an ulcer nearly chronically for  about 15 years, as to her guess.  She recently used to be followed by Dr.  Scotty Court but more recently is followed by Dr. Leonides Sake.  She has had a  coronary artery stent approximately two years ago but denies angina or  present cardiac symptoms.  On physical exam, she is not nauseous.  Sipping  on a little bit  of clear liquids.  On abdominal exam, there are no masses or  acute abdominal findings.   I would recommend that probably surgery is indicated.  She is scheduled for  a barium swallow in the a.m. and whether this would be adequate management  of vagotomy and pyloroplasty or more of a procedure would be determined and  discussed with her various gastroenterologists.  I think that a nonoperative  management has failed and because of her age of 56 and coronary artery  disease and other problems, hopefully a vagotomy and pyloroplasty would be  adequate and not a vagotomy, antrectomy and a Billroth I or II anastomosis.  I will await the results of the barium swallow tomorrow and see if  arrangements can be made to add her to the  OR schedule for possibly  Thursday, which is three days from now.                                               Anselm Pancoast. Zachery Dakins, M.D.    WJW/MEDQ  D:  01/10/2004  T:  01/10/2004  Job:  [id number]

## 2010-12-08 NOTE — Op Note (Signed)
NAMEKEATYN, LUCK NO.:  [id number]   MEDICAL RECORD NO.:  [id number]                   PATIENT TYPE:  INP   LOCATION:  0006                                 FACILITY:  [facility]   PHYSICIAN:  Ollen Gross, M.D.                 DATE OF BIRTH:  [birth year]   DATE OF PROCEDURE:  06/08/2002  DATE OF DISCHARGE:                                 OPERATIVE REPORT   PREOPERATIVE DIAGNOSIS:  Osteoarthritis, right hip.   POSTOPERATIVE DIAGNOSIS:  Osteoarthritis, right hip.   PROCEDURE:  Right total hip arthroplasty.   SURGEON:  Ollen Gross, M.D.   ASSISTANT:  Alexzandrew L. Julien Girt, P.A.   ANESTHESIA:  General.   ESTIMATED BLOOD LOSS:  800.   DRAINS:  Hemovac x1.   COMPLICATIONS:  None.   CONDITION:  Stable to recovery.   BRIEF CLINICAL NOTE:  The patient is an 77 year old female with severe  osteoarthritis of the right hip with pain refractory to nonoperative  management.  She presents now for right total hip arthroplasty.   DESCRIPTION OF PROCEDURE:  After the successful administration of general  anesthetic, the patient was placed in the left lateral decubitus position  with the right side up and held with the hip positioner.  The right lower  extremity was isolated from her perineum with plastic drapes and prepped and  draped in the usual sterile fashion.  A short posterolateral incision was  utilized, skin cut with a 10 blade through subcutaneous tissue to the level  of the fascia lata, which is incised in the line of the skin incision.  The  sciatic nerve is palpated and protected and the short external rotators  isolated off the femur.  Capsulectomy is then performed.  The hip is  dislocated and the center of the femoral head marked.  Trial prosthesis is  placed such that the center of the trial head corresponds with the center of  her native femoral head. Osteotomy line is marked on the femoral neck and  osteotomy made with an oscillating  saw.   Acetabular exposure is obtained.  The labrum is then removed and acetabular  reaming started at a 47, coursing in increments to the 51, and a 52 mm  Pinnacle acetabular shell was placed in anatomic position, transfixed with  two domed screws.  Trial 32 mm neutral liner is placed.   The femur is then prepared first with the canal finder and then broaching.  We broached to a size 1, then placed a size 2 high-offset neck with a 32 +0  head.  The hip is reduced with excellent stability, full extension, full  external rotation, 70 flexion, 40 degrees abduction, 90 degrees internal  rotation, and 90 degrees flexion in 70 degrees internal rotation.  All the  trials are subsequently removed and then the permanent apex hole eliminator  and 32 mm neutral Marathon liner placed to  the acetabular shell.  The canal  restrictor trial is in place.  A size 3 is more appropriate, and a size 3  canal restrictor placed in the appropriate depth of the femoral canal.  The  canal is then prepared with pulsatile lavage and cement mixed.  One we have  implantation, then the cement is injected into the canal and pressurized.  A  size 2 high-offset Endurance Luster stem is placed in anatomic version.  Once the cement is fully hardened, a permanent 32 +1.5 head is placed, hip  reduced, and the same stability parameters noted.  The wound is copiously  irrigated with antibiotic solution and the short external rotators  reattached to the femur through drill holes.  The fascia lata is closed over  a Hemovac drain with interrupted #1 Vicryl, subcu closed with #1 and 2-0  Vicryl, subcuticular running 4-0 Monocryl.  The incision cleaned and dried  and Steri-Strips and a bulky sterile dressing applied.  The drain is hooked  to suction.  She is awakened and transported to the recovery room in stable  condition.                                               Ollen Gross, M.D.    FA/MEDQ  D:  06/08/2002  T:   06/08/2002  Job:  [id number]

## 2010-12-08 NOTE — Cardiovascular Report (Signed)
NAMEGUIDA, ASMAN              ACCOUNT NO.:  [id number]   MEDICAL RECORD NO.:  [id number]          PATIENT TYPE:  OIB   LOCATION:  2899                         FACILITY:  [facility]   PHYSICIAN:  Richard A. Alanda Amass, M.D.DATE OF BIRTH:  1917/08/31   DATE OF PROCEDURE:  11/13/2005  DATE OF DISCHARGE:                              CARDIAC CATHETERIZATION   PROCEDURE:  Retrograde central aortic catheterization, selective coronary  angiography via Judkins technique, pre and post IC nitroglycerin  administration, LV angiogram RAO/LAO projection, selective bilateral renal  angiography by hand injection for significant renovascular systemic  hypertension and past right renal artery stenting.   BRIEF HISTORY:  Please refer to H&P of November 09, 2005.  Mrs. Apps is a  wonderful 25 year old mother of three with two grandchildren, is married to  Rayna Sexton who is a patient of ours who is 44 and has had AVR and is doing well.  Mrs. Pettaway has known peripheral arterial disease, renovascular  hypertension, and coronary artery disease.  She has hyperlipidemia, GERD,  esophageal stricture under therapy.  She has previously undergone  angiography and PCI June 24, 2003, for high-grade 95% proximal RCA  stenosis.  This was treated with a bare-metal 4.0/15 large vessel ZETA stent  with a marked right coronary shepherd's crook.  The remainder of the  dominant right coronary artery was widely patent.  She had a short main left  and noncritical disease of her ostial DX-2 and LAD just opposite that with  good LV function and minor inferior wall motion abnormality.  Subsequently,  because of 99% right renal artery stenosis with systemic hypertension, she  underwent right renal artery PTA and stenting on July 12, 2003, with a 6  mm x 1.5 Genesis stent successfully and has had negative follow-up renal  Dopplers.   In the interim she has had right hip replacement by Dr. Lequita Halt, esophageal  dilatation for  stricture and she underwent vagotomy and pyloroplasty by Dr.  Thornton Dales for gastric outlet obstruction with peptic ulcer disease June  2005.  She has recurrent angina of 1-2 weeks' duration, typical substernal  pain compatible with ischemia and relieved by her husband's nitroglycerin.  Was seen as an outpatient November 09, 2005.  It is difficult to treat her with  beta blockers because she has significant sinus bradycardia and only is able  to take low doses and is not able to take these at present.  She has no CNS  disease.  Neurologic status is fully ambulatory, active and independent,  living with her husband.  She has three children and two grandchildren, is a  nonsmoker.   Angiography is being done to evaluate her coronary arteries in this setting.  Informed consent was obtained from the patient and husband to proceed with  the procedure after risks, benefits, and alternatives were fully explained.  She was started on Plavix as an outpatient on November 09, 2005.   The patient was brought to the second floor CP laboratory in a post  absorptive state after 5 mg Valium p.o. premedication.  She was hydrated  preoperatively.  Creatinine was normal.  The CRFA was entered with single  anterior puncture using 18 thin-wall needle with modified Seldinger  technique.  A 6-French short Daig sidearm sheath was inserted without  difficulty.  Because of the short left main a 3.5 left coronary diagnostic  Cordis catheter and a 4 cm tapered right coronary diagnostic 6-French  catheter were used for selective coronary angiography.  IC nitroglycerin was  given through the left coronary artery with repeat injections obtained.  LV  angiogram was done in the RAO and LAO projection, 25 mL at 14 mL per second,  20 mL at 12 mL per second through a 6-French pigtail catheter.  Omnipaque  dye was used throughout the procedure.  Pullback pressure of the CA showed  no gradient across the aortic valve.  Selective  bilateral renal angiography  was done by hand injection with the right coronary catheter.  This  demonstrated less than 30% left renal artery narrowing with good flow and a  single left renal artery.  There was less than 30% narrowing of the  previously placed right renal artery stent with good flow and a single right  renal artery.  There was no trans-stenotic gradient across the ostia of the  right renal stent; although there was some mild haziness in this area there  was normal flow.   There was no gradient across the aortic valve on catheter pullback.   Pressures:  LV:  190/8; LVEDP 18 - 22 mmHg.  CA:  190/85 mmHg.   LV angiogram demonstrated vigorously contracting left ventricle with  angiographic LVH present and no segmental wall motion abnormality.  EF  approximately 60%.  No mitral regurgitation.  There was 4+ MAC (mitral  annular calcification) with no significant MR.   The main left was very short and normal.   The circumflex artery was comprised essentially of a single marginal artery  that was tortuous, bifurcated, moderately large, and no significant  stenosis.   The LAD had angiographically approximately 70-80% segmental narrowing after  the first large septal perforator at the junction of the proximal third and  after the DX-1.  This lesion extended beyond the origin of the DX-2 where  there was approximately 80% narrowing.  The DX-2 had 85% narrowing in its  ostial and proximal portion.  The remainder of the LAD was quite tortuous.  There was some systolic kinking but no significant systolic compression and  there was good flow to the apex of the heart where the LAD bifurcated.   The DX-1 had 80% narrowing.  It was a thin, small to moderate size vessel  that bifurcated distally.   The right coronary was a dominant large vessel.  It gave off several RV  marginal branches and had a shepherd's crook proximal bend.  The previously placed bare-metal ZETA of 4.0 stent  (4.0/15) was well visualized in the  proximal bend of the vessel.  There was less than 10% narrowing throughout  the stent.  The remainder of the vessel was widely patent and normal with  two large normal PDA lesions and a normal bifurcating PLA and AV node  branch.   DISCUSSION:  Mrs. Kraft has had recurrent angina that is quite typical  historically for her, relieved with nitroglycerin.  Angiographically she has  high-grade disease of the LAD, DX-2 bifurcation, and a moderately large LAD  vessel.  She has systemic hypertension with patent renal arteries and no  right renal artery stenosis.   She is a candidate for PCI of  her LAD and diagonal.  I would recommend IVUS  interrogation for further evaluation of this and vessel sizing because this  might require relatively complex intervention.   CATHETERIZATION DIAGNOSES:  1.  Arteriosclerotic heart disease - prior bare-metal stent June 24, 2003; high-grade proximal right coronary artery dominant vessel widely      patent on this study, ZETA 4.0/15.  2.  Recurrent angina typical chest pain relieved with nitroglycerin.  3.  High-grade bifurcation left anterior descending coronary artery,      diagonal two stenosis on angiography.  4.  Moderately severe ostial diagonal one stenosis, small vessel.  5.  Normal dominant circumflex.  6.  Normal left ventricular function, left ventricular hypertrophy, 4+      mitral annular calcification, no mitral regurgitation.  7.  Renovascular hypertension, widely patent right renal artery stent,      single renal arteries bilaterally.  8.  Hyperlipidemia on therapy.  9.  Remote right hip replacement, Dr. Lequita Halt.  10. Gastroesophageal reflux disease, past esophageal stricture.  11. Vagotomy pyloroplasty for gastric outlet obstruction, June 2005, Dr.      Zachery Dakins.  12. Hyperlipidemia.      Richard A. Alanda Amass, M.D.  Electronically Signed     RAW/MEDQ  D:  11/13/2005  T:  11/14/2005   Job:  [id number]   cc:   CP Lab   Melida Quitter, M.D.  Fax: [phone]

## 2010-12-08 NOTE — Discharge Summary (Signed)
NAMETASHIKA, GOODIN NO.:  [id number]   MEDICAL RECORD NO.:  [id number]                   PATIENT TYPE:  INP   LOCATION:  2908                                 FACILITY:  [facility]   PHYSICIAN:  Thereasa Solo. Little, M.D.              DATE OF BIRTH:  1918-01-18   DATE OF ADMISSION:  06/23/2003  DATE OF DISCHARGE:  06/25/2003                                 DISCHARGE SUMMARY   ADMISSION DIAGNOSES:  1. Bradycardia with dizziness and near syncope.  2. Chest pressure, rule out myocardial infarction.  3. Hypertension.   DISCHARGE DIAGNOSES:  1. Supraventricular tachycardia, bradycardia, possible sick sinus syndrome.  2. A 95% right coronary artery stenosis.  3. Hypertension.  4. Gastroesophageal reflux disease.  5. Hyperlipidemia.  6. Status post right hip replacement November 2003.   PROCEDURES:  1. Cardiac catheterization.  2. Percutaneous intervention.  3. ACS stent placement proximal RCA December 2004, Dr. Alanda Amass.   BRIEF HISTORY:  The patient is an 77 year old white female medical patient  of Dr. Dianna Limbo, who presented to the emergency room at [facility] with complaints of being dizzy.  She has no significant history of  bradycardia or dizziness but called the office today complaining of heart  rate being in the 40s.  The patient has a history of PACs and has been on  Toprol on p.r.n. basis for this.  She usually has episodes which occur once  or twice a month, and she takes the 1200 mg of Toprol at that time.  On  June 21, 2003, she had palpitations in the morning, took 12.5, and then  around 8 p.m. on Monday, she took another 12.5.  Yesterday her heart rate  was in the 40s or even slower.  She was getting more dizzy in her head than  usual and had a worse episode of palpitations than she had a day or two  prior to this.  She took two 1/2 tablets on Monday for this.  She presented  with a heart rate in the 40s and pressure  which she has had in the past, but  that, too, seems to have increased.   PAST MEDICAL HISTORY:  1. Cardiolite study October 2002 negative for ischemia with EF 76% with no     significant change from prior study May 2001.  2. Hypertension.  3. Status post right hip replacement.  4. History of peptic ulcer disease.  5. Gastroesophageal reflux disease.  6. History of hyperlipidemia.  7. History of SVT with PACs.   PAST SURGICAL HISTORY:  Include tonsillectomy and multiple EGDs.   ALLERGIES:  None known.   MEDICATIONS ON ADMISSION:  1. Hydralazine _________ mg p.r.n.  2. Bextra 10 mg b.i.d.  3. Protonix 40 mg daily  4. Diovan HCT 80/25 mg daily.  5. Menest 0.625 mg 1 daily.  6. Quinine sulfate 325 mg p.r.n.  7. Toprol  XL 25 mg tablet p.r.n.  8. Aspirin 325 mg daily.   For further History and Physical, please see dictated note.   HOSPITAL COURSE:  The patient was admitted.  EKG showed no acute changes.  CK-MBs were negative, and she was taken to the catheterization lab the  following day.  Cardiac catheterization showed 95% stenosis of the proximal  RCA.  She had a 40% at the proximal end of the second diagonal with a 30%  LAD stenosis.  The remainder of the catheterization was normal except right  renal artery showed a 95 to 99% stenosis just beyond the osseus that was  fairly focal.  The patient subsequently underwent percutaneous coronary  intervention and placement of a multilink DETA 4 x 15 mm bare-metal stent.  The patient tolerated the procedure well and returned to stepdown unit in  satisfactory condition.  She remained afebrile.  O2 saturation were 90 to  95% on room air.  She is having no chest discomfort.  Telemetry showed  ongoing episodes of short runs of SVT to be followed by some bradycardia  with no more than a second between these.  This was fairly intermittent, and  patient was asymptomatic.  CKs and troponins were all negative.  Hemoglobin  was 10.6,  hematocrit 31.7, white count 7.4, platelets 128,000.  Electrolytes  normal.  BUN 22, creatinine 1.3, glucose 112.  She was seen by Dr. Alanda Amass  who reviewed everything and felt that if she did well after mobilization,  she could go home late in the day on June 23, 2003.   DISCHARGE MEDICATIONS:  1. Nu-Iron 150 mg b.i.d. with lunch and supper.  2. Aspirin 81 mg 2 daily.  3. Folic acid 1 mg daily.  4. Lasix 75 mg daily.  5. Diovan HCT 80/12.5 mg daily.  6. Protonix 40 mg daily.  7. Bextra 10 mg b.i.d.  8. Menest 0.625 mg daily.  9. Quinine sulfate p.r.n.  10.      She can Tylenol for groin pain on a p.r.n. basis as needed.   DISCHARGE INSTRUCTIONS:  1. Discharge activities are to be light.  2. Maintain low-fat, low-salt diet.  3. We plan to asses the patient after an event monitor, and our office is     arranging that.  4. She will return to see Dr. Alanda Amass on December 9 at 11 a.m. for further     treatment as indicated.  5. She was given prescription for Nu-Iron 150 b.i.d., folic acid 1 daily,     and Plavix 75 mg daily.  Her other medicines are over-the-counter or she     has them.   CONDITION ON DISCHARGE:  Improved.      Eber Hong, P.A.                 Thereasa Solo. Little, M.D.    WDJ/MEDQ  D:  06/25/2003  T:  06/26/2003  Job:  [id number]   cc:   Ellin Saba., M.D.  [address] [place]  [place] [address]  Fax: [phone]

## 2010-12-08 NOTE — Cardiovascular Report (Signed)
[name], [name]              ACCOUNT NO.:  [id number]   MEDICAL RECORD NO.:  [id number]          PATIENT TYPE:  OIB   LOCATION:  6532                         FACILITY:  [facility]   PHYSICIAN:  Richard A. Alanda Amass, M.D.DATE OF BIRTH:  1918-05-07   DATE OF PROCEDURE:  11/13/2005  DATE OF DISCHARGE:                              CARDIAC CATHETERIZATION   PROCEDURE:  1.  Retrograde central aortic catheterization.  2.  Selective left coronary angiography via Judkins technique pre and post      intracoronary nitroglycerin administration.  3.  Double wire technique with left anterior descending - diagonal for high-      grade symptomatic bifurcation stenosis.  4.  Angiomax bolus plus infusion.  5.  Extra Plavix 300 mg p.o.  6.  Continued aspirin therapy.  7.  Cutting balloon atherectomy ostium DX2.  8.  DES CYPHER 2.5/18 left anterior descending LAD stent after IVUS      interrogation of the left anterior descending with a volcano IVUS      catheter gold eye.  9.  Side branch re-dilatation with a 2.5 balloon was done after left      anterior descending DES stenting.   PROCEDURE:  The previously placed 6-French Daig sidearm sheath was upgraded  to a 7-French short Daig sidearm sheath.  The left coronary was intubated  with a JL3 Scimed guiding catheter because of the short left main.  Using  two 0.014 inch Asahi soft wires the diagonal and LAD lesions were crossed  under fluoroscopic control and despite the tortuosity the guidewires were  free in the distal vessels.  A 2.5/6 mm Scimed cutting balloon was then used  to cross the diagonal ostia.  Cutting balloon atherectomy was done at 4/24  and 5/29.  The cutting balloon was then pulled back to the LAD lesion  opposite the DX2 and atherectomy was done at 6/26.  The balloon was removed.  Prior to intervention IVUS interrogation was done showing 85% volumetric  analysis, LAD stenosis with predominately soft plaque and some rim of  calcification which was relatively minor.  The LAD was diffusely diseased in  the area of angiographic stenosis and had a lumen of less than 1-1.2 mm just  beyond the DX2 ostia.  The reference LAD was 2.7-2.8 mm.   The LAD was then stented across the DX2 ostia with a CYPHER 2.5/18 DES stent  which was deployed a 12/34 and post dilated 14/27 after the diagonal wire  was pulled back.  There was excellent angiographic result in the LAD but  there was further stenosis of the ostium of the diagonal up to 90%.  This  was then re-crossed with a 0.014 inch guidewire.  A 2.5/8 Guidant Voyager  balloon was then used to cross through the struts and dilate the ostia at  7/45.  This was removed.  There was some spasm in the mid diagonal and  distal LAD which was relieved when the guidewires were pulled back and with  IC nitroglycerin and pseudo dissection of the LAD was completely relieved  with this as well.  Guidewires were  pulled back and final injection showed  excellent angiographic result with LAD stenosis reduced from 85 to 0%.  The  ostium of the DX2 was reduced from 85 to less than 30%.  There was no  dissection and good TIMI 3 flow.  There was tortuosity of the distal LAD.  There was some haziness around one of the areas of bend and systolic  kinking, but no significant compression or luminal narrowing with good flow.   Dilatation system was removed.  Sidearm sheath was flushed.  Angiomax was  discontinued and final ACT 383 seconds.   The patient has had successful DES stenting of the LAD and DX2, ostial  cutting balloon atherectomy, and balloon dilatation as outlined above for  high-grade symptomatic bifurcation stenosis.   No restenosis of the RCA from previous June 24, 2003 non-DES stent and no  significant stenosis of the non-dominant circumflex.  She has residual 70-  80% narrowing of the ostial and proximal small DX1 which was not intervened  upon.  Recommend continued medical  therapy at this time.  Full diagnoses are  listed in the patient's catheterization report.      Richard A. Alanda Amass, M.D.  Electronically Signed     RAW/MEDQ  D:  11/13/2005  T:  11/14/2005  Job:  [id number]   cc:   CP Lab   Melida Quitter, M.D.  Fax: [phone]

## 2011-04-23 ENCOUNTER — Telehealth: Payer: Self-pay | Admitting: Gastroenterology

## 2011-04-23 NOTE — Telephone Encounter (Signed)
Pt's last visit 02/26/07 for stomach not emptying. Hx of Gastric Outlet Obstruction secondary to PUD requiring Vagotomy, Pyloroplasty. Last EGD 03/03/2007 witth normal esophagus, large volume of partially digested food in cardia, erosions present in antrum given reglan x 4 weeks and placed on Gastroparesis diet. Daughter stated pt  has had problems x 2 weeks, but refused to come in. Yesterday she threw up a large volume of green beans. Pt given an appt for tomorrow at 2pm- sent for chart.

## 2011-04-24 ENCOUNTER — Ambulatory Visit (INDEPENDENT_AMBULATORY_CARE_PROVIDER_SITE_OTHER): Payer: Medicare Other | Admitting: Gastroenterology

## 2011-04-24 ENCOUNTER — Encounter: Payer: Self-pay | Admitting: Gastroenterology

## 2011-04-24 VITALS — BP 122/70 | HR 64 | Ht 63.0 in | Wt 136.0 lb

## 2011-04-24 DIAGNOSIS — R634 Abnormal weight loss: Secondary | ICD-10-CM

## 2011-04-24 DIAGNOSIS — R112 Nausea with vomiting, unspecified: Secondary | ICD-10-CM | POA: Insufficient documentation

## 2011-04-24 DIAGNOSIS — K219 Gastro-esophageal reflux disease without esophagitis: Secondary | ICD-10-CM | POA: Insufficient documentation

## 2011-04-24 DIAGNOSIS — K3184 Gastroparesis: Secondary | ICD-10-CM

## 2011-04-24 NOTE — Patient Instructions (Signed)
EGD Propoful 04/25/11 8:00 am arrive at 7:30 am on 4th floor EGD Brochure given for you to review.

## 2011-04-24 NOTE — Progress Notes (Signed)
History of Present Illness:  This is a 77 year old Caucasian female that I have followed for many years for gastroparesis associate with previous vagotomy and pyloroplasty in 2005 because of refractory peptic ulcer disease with secondary pyloric stenosis. In the past she has done well with Reglan therapy as needed and daily Protonix. She recently was seen by primary care because of postprandial epigastric discomfort within intermittent nausea and vomiting of partially digested food products. She was on Meloxicam for arthritis, and this medicine has been discontinued and Protonix was started by Primary Care. Patient's daughter is with her and relates a 15 pound weight loss over the last month. The patient denies dysphagia, lower gastrointestinal or hepatobiliary complaints. Apparently recent lab tests showed a hemoglobin of 9.9, and we will request labs from Dr. Holley Bouche. Other problems have included degenerative arthritis, anxiety and depression, coronary artery disease with previous angioplasty, and recurrent UTIs. Patient denies use of alcohol or cigarettes.  I have reviewed this patient's present history, medical and surgical past history, allergies and medications.     ROS: The remainder of the 10 point ROS is negative... she does complain of chronic low back pain, arthritis in her shoulders, continued mild depression, fatigue, and mild peripheral edema. She has had previous stenting of her right coronary artery and LAD  by Dr. Susa Griffins. Last endoscopic exam was in 2008. Evaluation for H. pylori at that time was negative. The patient is on 81 mg of aspirin a day apparently not on Plavix at this time.   Past Medical History  Diagnosis Date  . CAD (coronary artery disease)   . GERD (gastroesophageal reflux disease)   . Hypertension   . Gastric peptic ulcer   . Diverticulosis   . Hyperlipidemia    Past Surgical History  Procedure Date  . Stomach ulcer   . Replacement total hip w/   resurfacing implants     right hip  . Coronary stent placement   . Kidney stent   . Tonsillectomy     reports that she has never smoked. She has never used smokeless tobacco. She reports that she does not drink alcohol or use illicit drugs. family history includes Diabetes in her son; Heart disease in her sister; and Uterine cancer in her sister. No Known Allergies     Physical Exam: General well developed well nourished patient in no acute distress, appearing her stated age Eyes PERRLA, no icterus, fundoscopic exam per opthamologist Skin no lesions noted Neck supple, no adenopathy, no thyroid enlargement, no tenderness Chest clear to percussion and auscultation Heart no significant murmurs, gallops or rubs noted Abdomen no hepatosplenomegaly masses or tenderness, BS normal.  Extremities no acute joint lesions, edema, phlebitis or evidence of cellulitis. Neurologic patient oriented x 3,, no focal neurologic deficits noted. Psychological mental status normal and normal affect.  Assessment and plan: Chronic post vagotomy gastroparesis with probable NSAID induced gastric ulceration and possible organic gastric outlet obstruction. She has had a major change in her symptomatology over the last 6-8 weeks. I have scheduled her for followup endoscopy with propofol sedation and  nurse anesthesia dilation and airway control. She is not on NSAIDs at this time, and I have advised her to continue on Protonix. Other medications as per Dr. Holley Bouche, and we will request copies of recent labs from him  Please copy her primary care physician, referring physician, and pertinent subspecialists.Dr. Durwin Nora and Dr. Alanda Amass.  Encounter Diagnoses  Name Primary?  . Gastroparesis Yes  .  Nausea & vomiting   . GERD (gastroesophageal reflux disease)   . Weight loss

## 2011-04-25 ENCOUNTER — Encounter: Payer: Self-pay | Admitting: Gastroenterology

## 2011-04-25 ENCOUNTER — Telehealth: Payer: Self-pay | Admitting: *Deleted

## 2011-04-25 ENCOUNTER — Ambulatory Visit (AMBULATORY_SURGERY_CENTER): Payer: Medicare Other | Admitting: Gastroenterology

## 2011-04-25 DIAGNOSIS — R112 Nausea with vomiting, unspecified: Secondary | ICD-10-CM

## 2011-04-25 DIAGNOSIS — Z9889 Other specified postprocedural states: Secondary | ICD-10-CM | POA: Insufficient documentation

## 2011-04-25 DIAGNOSIS — K3184 Gastroparesis: Secondary | ICD-10-CM

## 2011-04-25 DIAGNOSIS — K253 Acute gastric ulcer without hemorrhage or perforation: Secondary | ICD-10-CM

## 2011-04-25 DIAGNOSIS — R634 Abnormal weight loss: Secondary | ICD-10-CM | POA: Insufficient documentation

## 2011-04-25 DIAGNOSIS — K219 Gastro-esophageal reflux disease without esophagitis: Secondary | ICD-10-CM

## 2011-04-25 MED ORDER — SODIUM CHLORIDE 0.9 % IV SOLN: 500.0000 mL | INTRAVENOUS | Status: DC

## 2011-04-25 NOTE — Patient Instructions (Signed)
Please review discharge instructions (blue and green sheets)  Take Reglan 10mg  twice daily 30 minutes before breakfast and supper  Call Dr. Norval Gable office in the next day or two to set up office appointment for 1 week  Stop Nsaids (anti-inflammatories)

## 2011-04-25 NOTE — Telephone Encounter (Signed)
I was going to call pt's daughter to schedule an appt in 1 week, but it was already done- 05/04/11 at 11:15am per appts.

## 2011-04-26 ENCOUNTER — Encounter: Payer: Self-pay | Admitting: Internal Medicine

## 2011-04-26 ENCOUNTER — Telehealth: Payer: Self-pay | Admitting: *Deleted

## 2011-04-26 NOTE — Telephone Encounter (Signed)

## 2011-04-27 DIAGNOSIS — K253 Acute gastric ulcer without hemorrhage or perforation: Secondary | ICD-10-CM

## 2011-04-27 DIAGNOSIS — K259 Gastric ulcer, unspecified as acute or chronic, without hemorrhage or perforation: Secondary | ICD-10-CM

## 2011-04-29 ENCOUNTER — Encounter: Payer: Self-pay | Admitting: Gastroenterology

## 2011-05-04 ENCOUNTER — Ambulatory Visit (INDEPENDENT_AMBULATORY_CARE_PROVIDER_SITE_OTHER): Payer: Medicare Other | Admitting: Gastroenterology

## 2011-05-04 ENCOUNTER — Encounter: Payer: Self-pay | Admitting: Gastroenterology

## 2011-05-04 VITALS — BP 136/72 | HR 80 | Ht 63.0 in | Wt 126.2 lb

## 2011-05-04 DIAGNOSIS — K259 Gastric ulcer, unspecified as acute or chronic, without hemorrhage or perforation: Secondary | ICD-10-CM | POA: Insufficient documentation

## 2011-05-04 DIAGNOSIS — K311 Adult hypertrophic pyloric stenosis: Secondary | ICD-10-CM

## 2011-05-04 DIAGNOSIS — T39395A Adverse effect of other nonsteroidal anti-inflammatory drugs [NSAID], initial encounter: Secondary | ICD-10-CM

## 2011-05-04 DIAGNOSIS — T490X5A Adverse effect of local antifungal, anti-infective and anti-inflammatory drugs, initial encounter: Secondary | ICD-10-CM

## 2011-05-04 NOTE — Patient Instructions (Signed)
You have been scheduled and instructed for an Endoscopy.

## 2011-05-04 NOTE — Progress Notes (Signed)
History of Present Illness: This is a very pleasant 77 year old Caucasian female with chronic gastric outlet obstruction who is status post vagotomy and pyloroplasty by Dr. Consuello Bossier in 2005. She continues with early satiety, nausea vomiting, anorexia weight loss. Endoscopy one week ago showed a very stenotic pyloric channel that was dilated endoscopically with the endoscope. She continues with her same problems but is approximately 50% better. She denies abdominal pain, nausea vomiting, and has surprisingly maintained normal bowel movements. She denies systemic complaints but is all multiple medications listed and reviewed.    Current Medications, Allergies, Past Medical History, Past Surgical History, Family History and Social History were reviewed in [employer] record.   Assessment and plan: Partial gastric outlet obstruction secondary to NSAID ulceration and cicatrization with negative H. pylori workup. We have stopped her NSAIDs and she is using when necessary tramadol, also Protonix 40 mg twice a day. I have asked her to take an oral protein supplements, and to continue her frequent small feedings diet as tolerated. We will repeat her endoscopy with pyloric channel balloon dilations. To continue her other medications as listed and reviewed. I have reviewed all of the endoscopic findings with the patient and her daughter. Also have reviewed possible complications such as perforation or bleeding from these procedures. I think that some of her gastric emptying problems are also related to her previous vagotomy.  Please copy her primary care physician, referring physician, and pertinent subspecialists. Encounter Diagnosis  Name Primary?  . Gastric outlet obstruction Yes

## 2011-05-09 ENCOUNTER — Inpatient Hospital Stay (HOSPITAL_COMMUNITY): Payer: Medicare Other

## 2011-05-09 ENCOUNTER — Encounter: Payer: Self-pay | Admitting: Gastroenterology

## 2011-05-09 ENCOUNTER — Ambulatory Visit (AMBULATORY_SURGERY_CENTER): Payer: Medicare Other | Admitting: Gastroenterology

## 2011-05-09 ENCOUNTER — Inpatient Hospital Stay (HOSPITAL_COMMUNITY)
Admission: AD | Admit: 2011-05-09 | Discharge: 2011-05-16 | DRG: 381 | Disposition: A | Discharge status: Home / Self Care | Payer: Medicare Other | Source: Ambulatory Visit | Attending: Gastroenterology | Admitting: Gastroenterology

## 2011-05-09 DIAGNOSIS — M19019 Primary osteoarthritis, unspecified shoulder: Secondary | ICD-10-CM | POA: Diagnosis present

## 2011-05-09 DIAGNOSIS — K253 Acute gastric ulcer without hemorrhage or perforation: Secondary | ICD-10-CM

## 2011-05-09 DIAGNOSIS — K311 Adult hypertrophic pyloric stenosis: Secondary | ICD-10-CM | POA: Diagnosis present

## 2011-05-09 DIAGNOSIS — I2510 Atherosclerotic heart disease of native coronary artery without angina pectoris: Secondary | ICD-10-CM | POA: Diagnosis present

## 2011-05-09 DIAGNOSIS — K3184 Gastroparesis: Secondary | ICD-10-CM

## 2011-05-09 DIAGNOSIS — G522 Disorders of vagus nerve: Secondary | ICD-10-CM | POA: Diagnosis present

## 2011-05-09 DIAGNOSIS — I10 Essential (primary) hypertension: Secondary | ICD-10-CM | POA: Diagnosis present

## 2011-05-09 DIAGNOSIS — M810 Age-related osteoporosis without current pathological fracture: Secondary | ICD-10-CM | POA: Diagnosis present

## 2011-05-09 DIAGNOSIS — K56609 Unspecified intestinal obstruction, unspecified as to partial versus complete obstruction: Secondary | ICD-10-CM

## 2011-05-09 DIAGNOSIS — M19049 Primary osteoarthritis, unspecified hand: Secondary | ICD-10-CM | POA: Diagnosis present

## 2011-05-09 DIAGNOSIS — D649 Anemia, unspecified: Secondary | ICD-10-CM | POA: Diagnosis present

## 2011-05-09 DIAGNOSIS — Z9861 Coronary angioplasty status: Secondary | ICD-10-CM

## 2011-05-09 DIAGNOSIS — E46 Unspecified protein-calorie malnutrition: Secondary | ICD-10-CM | POA: Diagnosis present

## 2011-05-09 DIAGNOSIS — E785 Hyperlipidemia, unspecified: Secondary | ICD-10-CM | POA: Diagnosis present

## 2011-05-09 DIAGNOSIS — K259 Gastric ulcer, unspecified as acute or chronic, without hemorrhage or perforation: Secondary | ICD-10-CM

## 2011-05-09 DIAGNOSIS — K5900 Constipation, unspecified: Secondary | ICD-10-CM | POA: Diagnosis present

## 2011-05-09 DIAGNOSIS — F329 Major depressive disorder, single episode, unspecified: Secondary | ICD-10-CM | POA: Diagnosis present

## 2011-05-09 DIAGNOSIS — A048 Other specified bacterial intestinal infections: Secondary | ICD-10-CM | POA: Diagnosis present

## 2011-05-09 DIAGNOSIS — F3289 Other specified depressive episodes: Secondary | ICD-10-CM | POA: Diagnosis present

## 2011-05-09 DIAGNOSIS — Z8711 Personal history of peptic ulcer disease: Secondary | ICD-10-CM

## 2011-05-09 DIAGNOSIS — Z79899 Other long term (current) drug therapy: Secondary | ICD-10-CM

## 2011-05-09 LAB — CBC
HCT: 31.9 % — ABNORMAL LOW (ref 36.0–46.0)
Hemoglobin: 9.9 g/dL — ABNORMAL LOW (ref 12.0–15.0)
MCH: 24.1 pg — ABNORMAL LOW (ref 26.0–34.0)
MCHC: 31 g/dL (ref 30.0–36.0)
Platelets: 196 10*3/uL (ref 150–400)
RBC: 4.1 MIL/uL (ref 3.87–5.11)
RDW: 20.8 % — ABNORMAL HIGH (ref 11.5–15.5)

## 2011-05-09 LAB — COMPREHENSIVE METABOLIC PANEL
AST: 19 U/L (ref 0–37)
Albumin: 2.9 g/dL — ABNORMAL LOW (ref 3.5–5.2)
BUN: 15 mg/dL (ref 6–23)
Chloride: 103 mEq/L (ref 96–112)
Creatinine, Ser: 0.91 mg/dL (ref 0.50–1.10)
Potassium: 3.5 mEq/L (ref 3.5–5.1)
Total Bilirubin: 0.2 mg/dL — ABNORMAL LOW (ref 0.3–1.2)
Total Protein: 6.8 g/dL (ref 6.0–8.3)

## 2011-05-09 LAB — PROTIME-INR
INR: 1.03 (ref 0.00–1.49)
Prothrombin Time: 13.7 seconds (ref 11.6–15.2)

## 2011-05-09 MED ORDER — SODIUM CHLORIDE 0.9 % IV SOLN: 500.0000 mL | INTRAVENOUS | Status: DC

## 2011-05-09 NOTE — Patient Instructions (Signed)
PER DR Jarold Motto, PT SENT TO [facility] FOR DIRECT ADMISSION.  CALLED BED CONTROL AND PT IS TO GO TO ADMISSIONS AND WILL BE ON UNIT 5100.  IV SALINE LOCKED AND DATED.  INSTRUCTED ON WHERE TO GO AND GIVEN PROCEDURE REPORT AND OFFICE NOTE TO TAKE WITH HER.  PT D/C'D WITH HER DAUGHTER TO GO STRAIGHT TO ADMISSIONS.

## 2011-05-09 NOTE — Progress Notes (Signed)
Sedation orders were given by Dr. Jarold Motto.  Maw  Pt tolerated the EGD very well/ maw

## 2011-05-10 ENCOUNTER — Telehealth: Payer: Self-pay

## 2011-05-10 ENCOUNTER — Inpatient Hospital Stay (HOSPITAL_COMMUNITY): Payer: Medicare Other

## 2011-05-10 DIAGNOSIS — K257 Chronic gastric ulcer without hemorrhage or perforation: Secondary | ICD-10-CM

## 2011-05-10 DIAGNOSIS — K253 Acute gastric ulcer without hemorrhage or perforation: Secondary | ICD-10-CM

## 2011-05-10 DIAGNOSIS — K56609 Unspecified intestinal obstruction, unspecified as to partial versus complete obstruction: Secondary | ICD-10-CM

## 2011-05-10 DIAGNOSIS — K259 Gastric ulcer, unspecified as acute or chronic, without hemorrhage or perforation: Secondary | ICD-10-CM

## 2011-05-10 DIAGNOSIS — K311 Adult hypertrophic pyloric stenosis: Secondary | ICD-10-CM

## 2011-05-10 LAB — URINALYSIS, ROUTINE W REFLEX MICROSCOPIC
Bilirubin Urine: NEGATIVE
Leukocytes, UA: NEGATIVE
Nitrite: POSITIVE — AB
Specific Gravity, Urine: 1.008 (ref 1.005–1.030)
Urobilinogen, UA: 0.2 mg/dL (ref 0.0–1.0)
pH: 8 (ref 5.0–8.0)

## 2011-05-10 LAB — URINE MICROSCOPIC-ADD ON

## 2011-05-10 LAB — HELICOBACTER PYLORI SCREEN-BIOPSY: UREASE: NEGATIVE

## 2011-05-10 MED ORDER — IOHEXOL 300 MG/ML  SOLN
20.0000 mL | Freq: Once | INTRAMUSCULAR | Status: AC | PRN
  Administered 2011-05-10: 20 mL

## 2011-05-10 NOTE — Telephone Encounter (Signed)
No ID on answering machine. 

## 2011-05-11 ENCOUNTER — Telehealth: Payer: Self-pay | Admitting: *Deleted

## 2011-05-11 DIAGNOSIS — K253 Acute gastric ulcer without hemorrhage or perforation: Secondary | ICD-10-CM

## 2011-05-11 DIAGNOSIS — K56609 Unspecified intestinal obstruction, unspecified as to partial versus complete obstruction: Secondary | ICD-10-CM

## 2011-05-11 LAB — BASIC METABOLIC PANEL
BUN: 13 mg/dL (ref 6–23)
Calcium: 9 mg/dL (ref 8.4–10.5)
Creatinine, Ser: 0.83 mg/dL (ref 0.50–1.10)
GFR calc Af Amer: 70 mL/min — ABNORMAL LOW (ref >90)
GFR calc non Af Amer: 60 mL/min — ABNORMAL LOW (ref >90)

## 2011-05-11 LAB — CBC
HCT: 30.4 % — ABNORMAL LOW (ref 36.0–46.0)
MCHC: 31.3 g/dL (ref 30.0–36.0)
Platelets: 196 10*3/uL (ref 150–400)
RDW: 21.3 % — ABNORMAL HIGH (ref 11.5–15.5)
WBC: 5.4 10*3/uL (ref 4.0–10.5)

## 2011-05-11 LAB — VITAMIN B12: Vitamin B-12: 749 pg/mL (ref 211–911)

## 2011-05-11 LAB — IRON AND TIBC
Saturation Ratios: 16 % — ABNORMAL LOW (ref 20–55)
TIBC: 261 ug/dL (ref 250–470)

## 2011-05-11 NOTE — Telephone Encounter (Signed)
Called [name] who is at [facility] and she saw the path report and has ordered an antibiotic.

## 2011-05-11 NOTE — Telephone Encounter (Signed)
Message copied by Florene Glen on Fri May 11, 2011  3:01 PM ------      Message from: [place], [place] R      Created: Fri May 11, 2011  2:55 PM       COPY PAULA///IN [email or web address]

## 2011-05-12 DIAGNOSIS — K56609 Unspecified intestinal obstruction, unspecified as to partial versus complete obstruction: Secondary | ICD-10-CM

## 2011-05-12 DIAGNOSIS — K253 Acute gastric ulcer without hemorrhage or perforation: Secondary | ICD-10-CM

## 2011-05-12 LAB — URINE CULTURE
Colony Count: NO GROWTH
Culture  Setup Time: 201210191343
Special Requests: NORMAL

## 2011-05-14 ENCOUNTER — Inpatient Hospital Stay (HOSPITAL_COMMUNITY): Payer: Medicare Other

## 2011-05-14 DIAGNOSIS — E441 Mild protein-calorie malnutrition: Secondary | ICD-10-CM

## 2011-05-15 DIAGNOSIS — K56609 Unspecified intestinal obstruction, unspecified as to partial versus complete obstruction: Secondary | ICD-10-CM

## 2011-05-15 DIAGNOSIS — K253 Acute gastric ulcer without hemorrhage or perforation: Secondary | ICD-10-CM

## 2011-05-16 DIAGNOSIS — E441 Mild protein-calorie malnutrition: Secondary | ICD-10-CM

## 2011-05-22 NOTE — Consult Note (Signed)
[name], ESPERANZA NO.:  [id number]  MEDICAL RECORD NO.:  [id number]  LOCATION:  5508                         FACILITY:  [facility]  PHYSICIAN:  Brayton El, PA-C    DATE OF BIRTH:  03-07-1918  DATE OF CONSULTATION:  05/10/2011 DATE OF DISCHARGE:                                CONSULTATION   REQUESTING PHYSICIAN:  Sheryn Bison, MD  CONSULTING SURGEON:  Velora Heckler, MD.  REASON FOR CONSULTATION:  Partial gastric outlet obstruction.  HISTORY OF PRESENT ILLNESS:  Ms. [name] is a pleasant 77 year old female with a history of peptic ulcer disease.  She has had prior vagotomy and pyloroplasty at about 7 or 8 years ago for peptic ulcer disease and pyloric stenosis.  She has a long-term history of taking NSAIDs and that is the source of her peptic ulcer disease.  However, she subsequently has had recent weight loss and recurrent symptoms of nausea and vomiting, and early satiety.  She has been diagnosed with a history of gastroparesis secondary to her vagotomy and pyloroplasty, but had an EGD done a couple of weeks ago finding evidence of a prepyloric ulcer. This was dilated at that time, but the patient has not made much progress.  Therefore, she has been admitted for continued workup and possible intervention for the means of nutrition.  We have been asked to consult on the patient as a surgical team for possibility of gastrojejunostomy versus feeding jejunostomy.  PAST MEDICAL HISTORY:  Again, significant for gastroparesis secondary to peptic ulcer disease with history of vagotomy and pyloroplasty, history of coronary artery disease\angioplasty, hypertension, osteoporosis, hyperlipidemia.  SURGICAL HISTORY:  As noted.  FAMILY HISTORY:  Significant for cardiovascular disease.  SOCIAL HISTORY:  The patient lives at home alone; however, her daughter lives across the street from her.  She otherwise denies alcohol, tobacco, or illicit drug  use.  MEDICATIONS:  Please see associated list and this is confirmed from the history and physical.  ALLERGIES:  No known drug or latex allergies.  REVIEW OF SYSTEMS:  Please see history of present illness for pertinent findings.  PHYSICAL EXAMINATION:  GENERAL:  A thin elderly 77 year old female who is in no acute distress. VITAL SIGNS:  Temperature 98.0, heart rate 62, respiratory rate 20, blood pressure 151/70, oxygenation 96% on room air. HEENT:  Unremarkable. NECK:  Supple without lymphadenopathy. LUNGS:  Clear.  No wheezes, rhonchi, or rales.  Normal respiratory effort without use of accessory muscles. HEART:  Regular rate and rhythm.  No murmurs, gallops, or rubs. ABDOMEN:  Soft, flat, nondistended, nontender.  She has an upper midline scar compatible with surgical history. RECTAL:  Deferred. MUSCULOSKELETAL:  Slightly diminished range of motion secondary to osteoarthritis. SKIN:  Otherwise, warm and dry with good turgor. NEUROLOGIC:  The patient is alert and oriented, and able to give appropriate history.  LABORATORY DATA:  White blood cell count of 6.2, hemoglobin of 9.9, hematocrit of 31.9, platelet count of 196.  Metabolic panel shows a sodium of 140, potassium of 3.5, chloride of 103, CO2 of 30, BUN of 15, creatinine of 0.9, glucose of 90.  Liver enzymes within normal limits. INR is normal at 1.0.  IMAGING:  Plain film imaging shows evidence of NG tube placement that has been placed with the tip in the proximal duodenum.  IMPRESSION: 1. Recurrent pyloric ulcer with stenosis and associated gastroparesis     leading to at least partial gastric-outlet obstruction. 2. Multiple medical comorbidities as listed.  PLAN:  Currently NG tube is in place, though the tip is being right at the proximal duodenum.  I am not sure if the patient will tolerate tube feeds at this time.  This could potentially be advanced past the pylorum or down to the ligament of Treitz, and may  facilitate tube feedings.  We will certainly discuss this with the primary team as well as the patient and family regarding surgical options such as gastrojejunostomy versus feeding jejunostomy.  Question whether the patient has significant cardiac disease and would there be any benefit to any preoperative cardiac workup.  There are apparently pending biopsies of the ulcer that will also await.     Brayton El, PA-C     KB/MEDQ  D:  05/10/2011  T:  05/11/2011  Job:  [id number]  Electronically Signed by Brayton El  on 05/16/2011 03:45:38 PM Electronically Signed by Darnell Level MD on 05/22/2011 11:16:42 AM

## 2011-05-25 NOTE — Discharge Summary (Signed)
NAMEBRENLEE, [name] NO.:  [id number]  MEDICAL RECORD NO.:  [id number]  LOCATION:  5508                         FACILITY:  [facility]  PHYSICIAN:  Rachael Fee, MD   DATE OF BIRTH:  March 07, 1918  DATE OF ADMISSION:  05/09/2011 DATE OF DISCHARGE:  05/16/2011                              DISCHARGE SUMMARY   ADMITTING DIAGNOSES: 1. Partial gastric outlet obstruction owing to ulcer at the pyloric     channel and pyloric stenosis. 2. History of peptic ulcer disease.  Status post vagotomy and     pyloroplasty in 2005.  Within the last 10 days, the patient has     undergone upper endoscopy for complaints of nausea/vomiting,     anorexia, and weight loss.  This endoscopy showed recurrent ulcer     at the pylorus along with pyloric stenosis.  She has ongoing     symptoms, despite treatment with twice daily Protonix. 3. Vagotomy-related gastroparesis.  The patient is on Reglan twice     daily at home. 4. History of hypertension. 5. History of depression.  The patient was started on Remeron about 4     days prior to admission. Daughter relates med started more for its     appetite stimulating, rather than its mood altering effect 6. Arthritis. 7. Coronary artery disease.  She is status post angioplasty. 8. Osteoporosis. 9. Hyperlipidemia.  DISCHARGE DIAGNOSES: 1. Partial gastric outlet obstruction owing to recurrent pyloric     ulcers, resolved.  Nausea, vomiting, and appetite improving. 2. Positive Helicobacter pylori on gastric biopsy.  She is day 5 of 14     of antibiotic therapy with clarithromycin, amoxicillin in addition     to proton pump inhibitor therapy. 3. Long-standing gastroparesis.  The patient currently not having     significant nausea or vomiting. 4. Microcystic anemia. 5. History of hypertension.  While observed during this lengthy     hospital stay, the patient's blood pressure was well regulated off     all blood pressure medications. 6.  Protein malnutrition.  The patient was supported with Dobbhoff tube     feedings for many days during this admission.  The feeding tube has     been pulled. 7. History of insomnia.  The patient has not had significant insomnia     during this admission. 8. Rule out urinary tract infection.  All urine tests failed to     support a diagnosis of urinary tract infection.  PROCEDURES:  None.  CONSULTATIONS:  With Darnell Level from General Surgery regarding a possible need for feeding jejunostomy versus gastrojejunostomy.  BRIEF HISTORY:  Ms. [name] is a pleasant and quite alert and functional 77 year old female.  In 2005, she had undergone pyloroplasty and vagotomy for peptic ulcer disease.  Lately She had come to the attention of [facility] for complaints of anorexia, weight loss, and nausea/vomiting.  The patient underwent an upper endoscopy on April 25, 2011 showing ulcer in the prepyloric region along with a stenotic- appearing pylorus.  CLO test was done and was negative at that time. She had a large volume of partially digested food retained in the stomach.  On the morning  of May 09, 2011, the patient underwent another upper endoscopy, which still showed ulcer at the pylorus and retained food.  The patient was admitted for further management.  Weight loss was about 20 pounds over the last 3 months, mostly because of early satiety.  LABORATORIES:  Hemoglobin 9.5, hematocrit 30.4.  MCV 77.7.  Platelets 196,000.  PT 13.7, INR 1.0.  Sodium 138, potassium 3.6.  Chloride 106, CO2 of 25.  Glucose ranged 90 up to 128.  BUN 15, creatinine 0.9, total bilirubin 0.2, alkaline phosphatase 67.  AST 19, ALT 17.  Albumin 2.9. Total protein 6.8.  Iron 41, total iron-binding capacity 261, iron saturation 16%.  B12 level 749, serum folate level greater than 20. Ferritin level 16.  Folate level greater than 20.  Pre-albumin 12. Urinalysis showed many bacteria, leukocyte esterase negative,  urine nitrite positive, and a rare number of epithelial cells.  Urine culture did not grow any organisms.    RADIOLOGY STUDIES:  Acute abdominal series on May 09, 2011 showed fibrosis and calcified granulomas in the lungs.  Bowel gas pattern was nonobstructive.  KUB following placement of a feeding tube determined the tip of the feeding tube placed within the proximal duodenum.  Upper GI series with KUB on May 14, 2011 showed the feeding tube was no longer transpyloric, but positioned now in the proximal stomach.  There was some delay in gastric emptying,but after several minutes, the duodenum and proximal jejunum opacified.  No significant retained fluid or food debris noted at the beginning of the study.  A large outpouching of contrast noted in the pyloric region at the distal stomach, presumably secondary to large gastric ulcer. However, this did change shape more than is typically seen for an ulcer, so a large diverticulum could also have this appearance.  HOSPITAL COURSE:  The patient was admitted to a non-telemetry bed.  She was allowed sips of clear liquids and some oral medications, but buy and  large medications initially were IV formulations.  She was started on IV Protonix once daily.  She was supported with IV fluids.  A Dobbhoff feeding tube was placed post pyloric, and she began tube feeds which she tolerated well.  Dr. Gerrit Friends evaluated her for surgery.  General Surgery followed her along during this hospitalization.  Fortunately, her GI symptoms resolved over the course of the hospitalization and thus she did not require surgery.  A biopsy obtained on the most recent upper endoscopy returned and it was actually positive for H. pylori.  Recall that the CLO test on the earlier endoscopy had been negative.  She was started on a 14-day H. pylori eradication program with amoxicillin, clarithromycin, and  twice daily Protonix.  Eventually, we were able to start  the patient on oral feedings, which she tolerated up to a level of a low residue diet.  On May 15, 2011, Dr. Christella Hartigan removed the patient's feeding tube.  She was discharged to  home in stable and much improved condition on May 16, 2011.  We  did not get a followup weight but for future reference, her weight on May 09, 2011 was 55.5kg.  The patient has followup office visit arranged with Dr. Jarold Motto on June 05, 2011 at 2:30 p.m.  Diet will be low-residue. She and her daughter understood that supplements with Boost, Ensure, or Instant Breakfast 2-3 times daily would be beneficial.  MEDICATIONS AT DISCHARGE: 1. Amoxicillin 500 mg 2 tablets p.o. twice daily for 9 days. 2. Clarithromycin 500 mg 1  p.o. twice daily for 9 days. 3. Ferrous sulfate 325 mg p.o. once daily. 4. Glycerin rectal suppositories 1 per rectum daily. 5. Alprazolam 0.5 mg once daily as needed at bedtime for sleep or     anxiety. 6. Calcium gluconate 500 mg once daily. 7. Docusate 100 mg 2 p.o. at bedtime. 8. Fish oil 1000 mg 1 capsule twice daily. 9. Folic acid 1 mg once daily. 10.Multivitamin 1 tablet daily. 11.Protonix 40 mg 1 tablet twice daily before meals for 9 days and     after 9 days, she could drop this back to once daily. 12.Remeron 15 mg 1 p.o. at bedtime. 13.Restoril 15 mg daily at bedtime p.r.n. insomnia. 14.Tramadol 50 mg 1 p.o. t.i.d. p.r.n. pain. 15.Tylenol 500 mg 1-2 tablets p.o. q.6 h. p.r.n. pain.  The patient's medications that were discontinued during this admission include triamterene/hydrochlorothiazide, Estrace, losartan and metoclopramide.  Additional medications that may need to be readdressed are her use of benzodiazepines.  She has tolerated these in the past; however, she does not have significant anxiety and insomnia has not been a significant problem for her, so it may be best if in future her Restoril and alprazolam are readdressed because she is elderly and  has increased risk of falls in taking these benzodiazepines.     Jennye Moccasin, PA-C   ______________________________ Rachael Fee, MD    SG/MEDQ  D:  05/18/2011  T:  05/18/2011  Job:  [id number]  Electronically Signed by Jennye Moccasin PA-C on 05/23/2011 08:35:56 PM Electronically Signed by Rob Bunting MD on 05/25/2011 07:28:56 AM

## 2011-05-28 NOTE — H&P (Signed)
NAMESAVILLA, Odom NO.:  [id number]  MEDICAL RECORD NO.:  [id number]  LOCATION:  5508                         FACILITY:  [facility]  PHYSICIAN:  Hedwig Morton. Juanda Chance, MD     DATE OF BIRTH:  07-Nov-1917  DATE OF ADMISSION:  05/09/2011 DATE OF DISCHARGE:                             HISTORY & PHYSICAL   PRIMARY GASTROENTEROLOGIST:  Vania Rea. Jarold Motto, MD, Caleen Essex, FAGA  HISTORY OF PRESENT ILLNESS:  [name] is a 77 year old female with a history of vagotomy and pyloroplasty in 2005 for PUD and pyloric stenosis. The patient recently presented to our office for nausea, vomiting and weight loss.  She had been taking NSAIDs on a long-term basis, though those had been discontinued by the time she was seen in the office.  She underwent an EGD on April 25, 2011,  with findings of a pre-pyloric ulcer and a stenotic pylorus.  The pylorus was dilated by Dr. Jarold Motto. A CLO test was negative for H. pylori.There was a large volume of partially digested food in her stomach. This morning, the patient went for a repeat upper endoscopy which, again showed retained food and a pyloric ulcer. The patient was admitted for further management.  [name] tells me she has only vomited a couple of times over the last several days.  She has lost about 20 pounds in the last 3 months secondary to early satiety.  She gives a history of chronic constipation managed with Colace and suppositories as needed.  PAST MEDICAL HISTORY:  Gastroparesis secondary to vagotomy and pyloroplasty, peptic ulcer disease, arthritis, anxiety and depression, coronary artery disease/angioplasty, hypertension, osteoporosis and hyperlipidemia.  PAST SURGICAL HISTORY:  Vagotomy and pyloroplasty in 2005.  FAMILY MEDICAL HISTORY:  Cardiac disease and uterine cancer.  SOCIAL HISTORY:  Non smoker.  No tobacco.  The patient lives alone, daughter lives across the street, she has 2 other children.  HOME  MEDICATIONS: 1. Dyazide 37.5/25 mg daily as needed. 2. Ultram 50 mg as needed. 3. Restoril 15 mg at night as needed. 4. Protonix 40 mg b.i.d. 5. Xanax 0.5 mg at bedtime as needed. 6. Calcium gluconate 500 mg daily. 7. Colace 100 mg twice daily. 8. Suppositories for constipation as needed. 9. Estrace 0.5 mg daily. 10.Fish oil 2 g daily. 11.Folic acid 1 mg daily. 12.Cozaar 50 mg daily. 13.Reglan 5 mg twice daily. 14.Remeron 15 mg at bedtime. 15.Multiple vitamin 1 daily.  ALLERGIES:  No known drug allergies.  REVIEW OF SYSTEMS:  Positive for arthritis and shoulders, back and hands.  Positive for some shortness of breath with exertion.  Positive for some urinary hesitancy.  All other systems reviewed and negative except were noted in the HPI.  Previous endoscopies refer to history of present illness.  Laboratory studies to be ordered.  The most recent labs that I have are from January 19, 2009, and her CBC at that time reveals a white count of 7.8, hemoglobin 11.3, platelets 146 and MCV 98. Renal function at that time was normal with BUN 20, creatinine 0.98.   IMAGING: Acute abdominal series to be ordered.   PHYSICAL EXAMINATION:  VITAL SIGNS:  Temp 98.3, heart rate 63, respirations 20,  blood pressure 186/77 and 95% sat on room air. GENERAL:  The patient is a 77 year old white female, in no acute distress. HEENT:  Conjunctivae pink.  No icterus. NECK:  Supple.  No masses. LUNGS:  Decreased breath sounds at the bases, most pronounced in the right lower lobe. HEART:  Regular rate and rhythm, murmur best heard over the left lower sternal border. ABDOMEN:  Soft, mildly distended with bowel sounds, nontender.  No masses felt. EXTREMITIES:  No edema. LYMPHADENOPATHY:  No cervical lymphadenopathy. NEUROLOGIC:  Alert and oriented. PSYCHOLOGICAL:  Pleasant and cooperative. SKIN:  No significant lesions.  IMPRESSION: 1. Recurrent ulcer disease in the setting of chronic NSAID use.  History of vagotomy and     pyloroplasty in 2005, for peptic ulcer disease/pyloric stenosis.     Now with recurrent pyloric channel ulcer and pyloric stenosis     refractory to dilation several days ago.    Of note, Helicobacter pylori     is negative.  Ulcer biopsy is pending, malignancy not     excluded. 2. Gastroparesis secondary to vagotomy.  The patient takes Reglan     twice daily at home. 3. Hypertension, on treatment. 4. History of depression, Remeron started 3-4 days ago. 5. Significant weight loss secondary to #1. 6. See past medical history.  PLAN:  We will admit for IV fluids, small-bowel feeding tube for nutrition, NG tube if the patient has recurrent nausea and vomiting. Will ask  surgery to evaluate. Hold home medications, await gastric biopsies.     [name], [name]   ______________________________ Hedwig Morton. Juanda Chance, MD    PG/MEDQ  D:  05/09/2011  T:  05/10/2011  Job:  [id number]  Electronically Signed by [name] [name] on 05/25/2011 10:53:08 AM Electronically Signed by Lina Sar MD on 05/28/2011 05:28:43 PM

## 2011-06-05 ENCOUNTER — Ambulatory Visit (INDEPENDENT_AMBULATORY_CARE_PROVIDER_SITE_OTHER): Payer: Medicare Other | Admitting: Gastroenterology

## 2011-06-05 ENCOUNTER — Encounter: Payer: Self-pay | Admitting: Gastroenterology

## 2011-06-05 VITALS — BP 134/82 | HR 64 | Ht 63.0 in | Wt 126.8 lb

## 2011-06-05 DIAGNOSIS — R197 Diarrhea, unspecified: Secondary | ICD-10-CM

## 2011-06-05 MED ORDER — SACCHAROMYCES BOULARDII 250 MG PO CAPS: ORAL_CAPSULE | ORAL | Status: DC

## 2011-06-05 MED ORDER — METRONIDAZOLE 250 MG PO TABS: ORAL_TABLET | ORAL | Status: DC

## 2011-06-05 NOTE — Progress Notes (Signed)
This is a 77 year old Caucasian female recently hospitalized because of gastric outlet obstruction. She was found to have H. pylori but ulceration and was treated with triple drug therapy for 2 weeks. She now has watery diarrhea, gas and bloating and low-grade abdominal discomfort. Her nausea and vomiting early satiety have markedly improved, and she currently is on Protonix 40 mg twice a day.  Current Medications, Allergies, Past Medical History, Past Surgical History, Family History and Social History were reviewed in [employer] record.  Pertinent Review of Systems Negative   Physical Exam: Awake alert in no acute distress. No significant distention or localized areas of tenderness. Bowel sounds are positive.    Assessment and Plan: Probable C. difficile infection related to hospitalization and antibiotic use. She is to return stool for C. difficile toxin by PCR. I have started metronidazole 250 mg 3 times a day for 2 weeks with Florstar twice a day for 2 weeks. I also decreased her BP daily usage. Followup will depend on  her clinical course.

## 2011-06-05 NOTE — Patient Instructions (Signed)
Please go to the basement upon leaving today to have your labs done. Your prescription(s) has(have) been sent to your pharmacy for you to pick up. 

## 2011-06-07 ENCOUNTER — Other Ambulatory Visit: Payer: Medicare Other

## 2011-06-07 DIAGNOSIS — R197 Diarrhea, unspecified: Secondary | ICD-10-CM

## 2011-06-11 ENCOUNTER — Telehealth: Payer: Self-pay | Admitting: Gastroenterology

## 2011-06-11 NOTE — Telephone Encounter (Signed)
Patient advised that c-diff was negative.

## 2011-11-25 ENCOUNTER — Encounter (HOSPITAL_COMMUNITY): Payer: Self-pay | Admitting: *Deleted

## 2011-11-25 ENCOUNTER — Inpatient Hospital Stay (HOSPITAL_COMMUNITY)
Admission: EM | Admit: 2011-11-25 | Discharge: 2011-12-04 | DRG: 469 | Disposition: A | Discharge status: Skilled Nursing Facility | Payer: Medicare Other | Attending: Internal Medicine | Admitting: Internal Medicine

## 2011-11-25 ENCOUNTER — Encounter (HOSPITAL_COMMUNITY): Payer: Self-pay | Admitting: Anesthesiology

## 2011-11-25 ENCOUNTER — Emergency Department (HOSPITAL_COMMUNITY): Payer: Medicare Other

## 2011-11-25 ENCOUNTER — Encounter (HOSPITAL_COMMUNITY)
Admission: EM | Disposition: A | Discharge status: Skilled Nursing Facility | Payer: Self-pay | Source: Home / Self Care | Attending: Internal Medicine

## 2011-11-25 DIAGNOSIS — Z96649 Presence of unspecified artificial hip joint: Secondary | ICD-10-CM

## 2011-11-25 DIAGNOSIS — I10 Essential (primary) hypertension: Secondary | ICD-10-CM | POA: Diagnosis present

## 2011-11-25 DIAGNOSIS — J189 Pneumonia, unspecified organism: Secondary | ICD-10-CM | POA: Diagnosis not present

## 2011-11-25 DIAGNOSIS — Z9861 Coronary angioplasty status: Secondary | ICD-10-CM | POA: Diagnosis present

## 2011-11-25 DIAGNOSIS — S72002A Fracture of unspecified part of neck of left femur, initial encounter for closed fracture: Secondary | ICD-10-CM | POA: Diagnosis present

## 2011-11-25 DIAGNOSIS — N390 Urinary tract infection, site not specified: Secondary | ICD-10-CM | POA: Diagnosis not present

## 2011-11-25 DIAGNOSIS — K219 Gastro-esophageal reflux disease without esophagitis: Secondary | ICD-10-CM | POA: Diagnosis present

## 2011-11-25 DIAGNOSIS — I2510 Atherosclerotic heart disease of native coronary artery without angina pectoris: Secondary | ICD-10-CM | POA: Diagnosis present

## 2011-11-25 DIAGNOSIS — I509 Heart failure, unspecified: Secondary | ICD-10-CM | POA: Diagnosis not present

## 2011-11-25 DIAGNOSIS — W19XXXA Unspecified fall, initial encounter: Secondary | ICD-10-CM

## 2011-11-25 DIAGNOSIS — F411 Generalized anxiety disorder: Secondary | ICD-10-CM | POA: Diagnosis present

## 2011-11-25 DIAGNOSIS — I4891 Unspecified atrial fibrillation: Secondary | ICD-10-CM | POA: Diagnosis not present

## 2011-11-25 DIAGNOSIS — N179 Acute kidney failure, unspecified: Secondary | ICD-10-CM | POA: Diagnosis not present

## 2011-11-25 DIAGNOSIS — S72009A Fracture of unspecified part of neck of unspecified femur, initial encounter for closed fracture: Principal | ICD-10-CM | POA: Diagnosis present

## 2011-11-25 DIAGNOSIS — F329 Major depressive disorder, single episode, unspecified: Secondary | ICD-10-CM | POA: Diagnosis present

## 2011-11-25 DIAGNOSIS — Z79899 Other long term (current) drug therapy: Secondary | ICD-10-CM

## 2011-11-25 DIAGNOSIS — M129 Arthropathy, unspecified: Secondary | ICD-10-CM | POA: Diagnosis present

## 2011-11-25 DIAGNOSIS — Z8673 Personal history of transient ischemic attack (TIA), and cerebral infarction without residual deficits: Secondary | ICD-10-CM

## 2011-11-25 DIAGNOSIS — F3289 Other specified depressive episodes: Secondary | ICD-10-CM | POA: Diagnosis present

## 2011-11-25 DIAGNOSIS — M810 Age-related osteoporosis without current pathological fracture: Secondary | ICD-10-CM | POA: Diagnosis present

## 2011-11-25 DIAGNOSIS — Z9849 Cataract extraction status, unspecified eye: Secondary | ICD-10-CM

## 2011-11-25 DIAGNOSIS — D62 Acute posthemorrhagic anemia: Secondary | ICD-10-CM | POA: Diagnosis not present

## 2011-11-25 DIAGNOSIS — D696 Thrombocytopenia, unspecified: Secondary | ICD-10-CM | POA: Diagnosis not present

## 2011-11-25 DIAGNOSIS — R339 Retention of urine, unspecified: Secondary | ICD-10-CM | POA: Diagnosis not present

## 2011-11-25 LAB — DIFFERENTIAL
Basophils Absolute: 0 10*3/uL (ref 0.0–0.1)
Basophils Relative: 0 % (ref 0–1)
Lymphocytes Relative: 5 % — ABNORMAL LOW (ref 12–46)
Monocytes Absolute: 1.1 10*3/uL — ABNORMAL HIGH (ref 0.1–1.0)
Neutro Abs: 17.4 10*3/uL — ABNORMAL HIGH (ref 1.7–7.7)
Neutrophils Relative %: 89 % — ABNORMAL HIGH (ref 43–77)

## 2011-11-25 LAB — BASIC METABOLIC PANEL
CO2: 28 mEq/L (ref 19–32)
Chloride: 102 mEq/L (ref 96–112)
Creatinine, Ser: 0.97 mg/dL (ref 0.50–1.10)
GFR calc Af Amer: 57 mL/min — ABNORMAL LOW (ref >90)
Potassium: 3.8 mEq/L (ref 3.5–5.1)
Sodium: 142 mEq/L (ref 135–145)

## 2011-11-25 LAB — PROTIME-INR
INR: 1.04 (ref 0.00–1.49)
Prothrombin Time: 13.8 seconds (ref 11.6–15.2)

## 2011-11-25 LAB — URINALYSIS, ROUTINE W REFLEX MICROSCOPIC
Glucose, UA: NEGATIVE mg/dL
Ketones, ur: NEGATIVE mg/dL
Leukocytes, UA: NEGATIVE
Protein, ur: NEGATIVE mg/dL
Urobilinogen, UA: 0.2 mg/dL (ref 0.0–1.0)

## 2011-11-25 LAB — URINE MICROSCOPIC-ADD ON

## 2011-11-25 LAB — CBC
MCHC: 33.6 g/dL (ref 30.0–36.0)
Platelets: 167 10*3/uL (ref 150–400)
RDW: 14.6 % (ref 11.5–15.5)
WBC: 19.6 10*3/uL — ABNORMAL HIGH (ref 4.0–10.5)

## 2011-11-25 LAB — HEMOGLOBIN A1C
Hgb A1c MFr Bld: 6 % — ABNORMAL HIGH (ref <5.7)
Mean Plasma Glucose: 126 mg/dL — ABNORMAL HIGH (ref <117)

## 2011-11-25 LAB — TSH: TSH: 4.147 u[IU]/mL (ref 0.350–4.500)

## 2011-11-25 LAB — PRO B NATRIURETIC PEPTIDE: Pro B Natriuretic peptide (BNP): 5334 pg/mL — ABNORMAL HIGH (ref 0–450)

## 2011-11-25 SURGERY — HEMIARTHROPLASTY, HIP, DIRECT ANTERIOR APPROACH, FOR FRACTURE
Anesthesia: General | Laterality: Left

## 2011-11-25 MED ORDER — HYDROMORPHONE HCL PF 1 MG/ML IJ SOLN: 0.5000 mg | INTRAMUSCULAR | Status: DC | PRN

## 2011-11-25 MED ORDER — MORPHINE SULFATE 2 MG/ML IJ SOLN
2.0000 mg | Freq: Once | INTRAMUSCULAR | Status: AC
  Administered 2011-11-25: 2 mg via INTRAVENOUS
  Filled 2011-11-25: qty 1

## 2011-11-25 MED ORDER — CEFAZOLIN SODIUM 1-5 GM-% IV SOLN
1.0000 g | Freq: Once | INTRAVENOUS | Status: AC
  Administered 2011-11-26: 1 g via INTRAVENOUS

## 2011-11-25 MED ORDER — ONDANSETRON HCL 4 MG/2ML IJ SOLN: 4.0000 mg | Freq: Three times a day (TID) | INTRAMUSCULAR | Status: DC | PRN

## 2011-11-25 MED ORDER — MIRTAZAPINE 15 MG PO TABS
15.0000 mg | ORAL_TABLET | Freq: Every day | ORAL | Status: DC
  Filled 2011-11-25: qty 1

## 2011-11-25 MED ORDER — MIRTAZAPINE 15 MG PO TABS
15.0000 mg | ORAL_TABLET | Freq: Every day | ORAL | Status: DC
  Administered 2011-11-25 – 2011-12-03 (×9): 15 mg via ORAL
  Filled 2011-11-25 (×12): qty 1

## 2011-11-25 MED ORDER — LOSARTAN POTASSIUM 50 MG PO TABS
50.0000 mg | ORAL_TABLET | Freq: Every day | ORAL | Status: DC
  Administered 2011-11-25: 50 mg via ORAL
  Filled 2011-11-25 (×2): qty 1

## 2011-11-25 MED ORDER — FOLIC ACID 1 MG PO TABS
1.0000 mg | ORAL_TABLET | Freq: Every day | ORAL | Status: DC
  Administered 2011-11-25 – 2011-12-04 (×10): 1 mg via ORAL
  Filled 2011-11-25 (×10): qty 1

## 2011-11-25 MED ORDER — ONDANSETRON HCL 4 MG/2ML IJ SOLN
4.0000 mg | Freq: Once | INTRAMUSCULAR | Status: AC
  Administered 2011-11-25: 4 mg via INTRAVENOUS
  Filled 2011-11-25: qty 2

## 2011-11-25 MED ORDER — FUROSEMIDE 10 MG/ML IJ SOLN: 40.0000 mg | Freq: Once | INTRAMUSCULAR | Status: DC

## 2011-11-25 MED ORDER — ACETAMINOPHEN 650 MG RE SUPP: 650.0000 mg | Freq: Four times a day (QID) | RECTAL | Status: DC | PRN

## 2011-11-25 MED ORDER — ACETAMINOPHEN 325 MG PO TABS
650.0000 mg | ORAL_TABLET | Freq: Two times a day (BID) | ORAL | Status: DC
  Administered 2011-11-25 – 2011-12-04 (×18): 650 mg via ORAL
  Filled 2011-11-25 (×18): qty 2

## 2011-11-25 MED ORDER — PANTOPRAZOLE SODIUM 40 MG PO TBEC
40.0000 mg | DELAYED_RELEASE_TABLET | Freq: Two times a day (BID) | ORAL | Status: DC
  Administered 2011-11-25 – 2011-12-04 (×18): 40 mg via ORAL
  Filled 2011-11-25 (×18): qty 1

## 2011-11-25 MED ORDER — SODIUM CHLORIDE 0.9 % IV SOLN: INTRAVENOUS | Status: DC

## 2011-11-25 MED ORDER — ALPRAZOLAM 0.25 MG PO TABS
0.2500 mg | ORAL_TABLET | Freq: Every evening | ORAL | Status: DC | PRN
  Administered 2011-11-25: 0.25 mg via ORAL
  Filled 2011-11-25: qty 1

## 2011-11-25 MED ORDER — ONE-DAILY MULTI VITAMINS PO TABS: 1.0000 | ORAL_TABLET | Freq: Every day | ORAL | Status: DC

## 2011-11-25 MED ORDER — DOCUSATE SODIUM 100 MG PO CAPS
100.0000 mg | ORAL_CAPSULE | Freq: Two times a day (BID) | ORAL | Status: DC
  Administered 2011-11-25 – 2011-12-04 (×19): 100 mg via ORAL
  Filled 2011-11-25 (×22): qty 1

## 2011-11-25 MED ORDER — HYDROMORPHONE HCL PF 1 MG/ML IJ SOLN
1.0000 mg | INTRAMUSCULAR | Status: DC | PRN
  Administered 2011-11-27: 1 mg via INTRAVENOUS
  Filled 2011-11-25: qty 1

## 2011-11-25 MED ORDER — ONDANSETRON HCL 4 MG/2ML IJ SOLN
4.0000 mg | Freq: Four times a day (QID) | INTRAMUSCULAR | Status: DC | PRN
  Administered 2011-11-25: 4 mg via INTRAVENOUS
  Filled 2011-11-25: qty 2

## 2011-11-25 MED ORDER — ONDANSETRON HCL 4 MG PO TABS: 4.0000 mg | ORAL_TABLET | Freq: Four times a day (QID) | ORAL | Status: DC | PRN

## 2011-11-25 MED ORDER — ADULT MULTIVITAMIN W/MINERALS CH
1.0000 | ORAL_TABLET | Freq: Every day | ORAL | Status: DC
  Administered 2011-11-25 – 2011-12-04 (×10): 1 via ORAL
  Filled 2011-11-25 (×10): qty 1

## 2011-11-25 MED ORDER — ACETAMINOPHEN 325 MG PO TABS
650.0000 mg | ORAL_TABLET | Freq: Four times a day (QID) | ORAL | Status: DC | PRN
  Administered 2011-11-29 – 2011-12-01 (×2): 650 mg via ORAL
  Filled 2011-11-25 (×2): qty 2

## 2011-11-25 MED ORDER — FUROSEMIDE 10 MG/ML IJ SOLN
40.0000 mg | Freq: Once | INTRAMUSCULAR | Status: AC
  Administered 2011-11-25: 40 mg via INTRAVENOUS
  Filled 2011-11-25: qty 4

## 2011-11-25 MED ORDER — HYDROCODONE-ACETAMINOPHEN 5-325 MG PO TABS
1.0000 | ORAL_TABLET | ORAL | Status: DC | PRN
  Administered 2011-11-25: 2 via ORAL
  Administered 2011-11-25: 1 via ORAL
  Administered 2011-11-26: 2 via ORAL
  Administered 2011-11-27 – 2011-12-01 (×4): 1 via ORAL
  Administered 2011-12-02: 2 via ORAL
  Administered 2011-12-04 (×2): 1 via ORAL
  Filled 2011-11-25: qty 2
  Filled 2011-11-25: qty 1
  Filled 2011-11-25: qty 2
  Filled 2011-11-25 (×3): qty 1
  Filled 2011-11-25: qty 2
  Filled 2011-11-25 (×3): qty 1

## 2011-11-25 NOTE — ED Provider Notes (Signed)
History     CSN: [id number]  Arrival date & time 11/25/11  0804   First MD Initiated Contact with Patient 11/25/11 0809      Chief Complaint  Patient presents with  . Fall  . Hip Pain    (Consider location/radiation/quality/duration/timing/severity/associated sxs/prior treatment) HPI  Patient is brought from her home by EMS status post fall with complaint of left hip pain. Per EMS and the patient, patient was in her home last night and tripped in her kitchen when she was trying to get her nightly meds. Patient states she fell landing on her left hip, denying hitting her head or loss of consciousness. Patient states she had immediate pain and called a neighbor who helped her get up and put into a chair. Patient states that throughout the night she was able to move herself from chair to chair by sliding, but has not been out of bear weight on her left hip due to pain. Because of ongoing pain and immobility patient called EMS this morning. Patient states that she lives alone in her home however she does live next to her daughter. However, her daughter is out of town on vacation. Patient states that she has no pain when at rest or lying still. Patient states pain is aggravated by movement or attempts to stand. Patient states her primary care provider is Dr. Holley Bouche. Patient has no other complaints. Once again. She denies hitting her head, loss of consciousness, dizziness, headache, neck pain, back pain, upper extremity pain or injury, abdominal pain, chest pain, right lower extremity pain or injury. Patient taken nothing for pain prior to arrival.  Past Medical History  Diagnosis Date  . CAD (coronary artery disease)   . GERD (gastroesophageal reflux disease)   . Hypertension   . Diverticulosis   . Hyperlipidemia   . Gastric peptic ulcer   . Allergy   . Anxiety   . Arthritis   . Blood transfusion     when had my hip replaced  . Cataract     removed bil eyes  . Depression   .  Osteoporosis   . Stroke     TIA  . Irregular heart beat     Past Surgical History  Procedure Date  . Stomach ulcer   . Replacement total hip w/  resurfacing implants     right hip  . Coronary stent placement   . Kidney stent   . Tonsillectomy   . Bil cataracts removed   . Colonoscopy     Family History  Problem Relation Age of Onset  . Uterine cancer Sister   . Heart disease Sister     and brother  . Diabetes Son   . Esophageal cancer Neg Hx   . Colon cancer Neg Hx   . Stomach cancer Neg Hx   . Hypertension Mother   . Hypertension Father     History  Substance Use Topics  . Smoking status: Never Smoker   . Smokeless tobacco: Never Used  . Alcohol Use: No    OB History    Grav Para Term Preterm Abortions TAB SAB Ect Mult Living                  Review of Systems  All other systems reviewed and are negative.    Allergies  Review of patient's allergies indicates no known allergies.  Home Medications   Current Outpatient Rx  Name Route Sig Dispense Refill  . ALPRAZOLAM 0.25 MG PO  TABS Oral Take 0.25 mg by mouth at bedtime as needed.      Marland Kitchen CALCIUM GLUCONATE 500 MG PO TABS Oral Take 500 mg by mouth daily.      Marland Kitchen DOCUSATE SODIUM 100 MG PO CAPS Oral Take 100 mg by mouth 2 (two) times daily.      Marland Kitchen ESTRADIOL 0.5 MG PO TABS Oral Take 0.5 mg by mouth daily.      . OMEGA-3 FATTY ACIDS 1000 MG PO CAPS Oral Take 2 g by mouth daily.      Marland Kitchen FOLIC ACID 1 MG PO TABS Oral Take 1 mg by mouth daily.      Marland Kitchen LOSARTAN POTASSIUM 50 MG PO TABS Oral Take 50 mg by mouth daily.      Marland Kitchen METRONIDAZOLE 250 MG PO TABS  Take 1 tablet by mouth three times a day for 2 weeks 42 tablet 0  . MIRTAZAPINE 15 MG PO TABS Oral Take 15 mg by mouth at bedtime.      Marland Kitchen ONE-DAILY MULTI VITAMINS PO TABS Oral Take 1 tablet by mouth daily.      Marland Kitchen PANTOPRAZOLE SODIUM 40 MG PO TBEC Oral Take 40 mg by mouth 2 (two) times daily.      Marland Kitchen SACCHAROMYCES BOULARDII 250 MG PO CAPS  Take i capsule by mouth twice  a day for 2 weeks 28 capsule 0  . SIMETHICONE 125 MG PO CHEW Oral Chew 125 mg by mouth every 6 (six) hours as needed.      Marland Kitchen TEMAZEPAM 15 MG PO CAPS Oral Take 15 mg by mouth at bedtime as needed.      Marland Kitchen TRAMADOL HCL 50 MG PO TABS Oral Take 50 mg by mouth as needed.      . TRIAMTERENE-HCTZ 37.5-25 MG PO CAPS Oral Take 1 capsule by mouth as needed.        There were no vitals taken for this visit.  Physical Exam  Nursing note and vitals reviewed. Constitutional: She is oriented to person, place, and time. She appears well-developed and well-nourished. No distress.  HENT:  Head: Normocephalic and atraumatic.  Eyes: Conjunctivae and EOM are normal. Pupils are equal, round, and reactive to light.  Neck: Normal range of motion. Neck supple.  Cardiovascular: Normal rate, regular rhythm, normal heart sounds and intact distal pulses.  Exam reveals no gallop and no friction rub.   No murmur heard. Pulmonary/Chest: Effort normal and breath sounds normal. No respiratory distress. She has no wheezes. She has no rales. She exhibits no tenderness.  Abdominal: Soft. Bowel sounds are normal. She exhibits no distension and no mass. There is no tenderness. There is no rebound and no guarding.  Musculoskeletal: Normal range of motion. She exhibits tenderness. She exhibits no edema.       TTP of left inguinal region with decreased ROM due to pain. LLE is shorted and externally rotated. Pelvis is stable. FROM of neck without pain and no TTP of entire midline spine. FROM of bilateral UE without pain. FROM of RLE without pain. Good pedal pulse and sensation of bialteral LE.   Neurological: She is alert and oriented to person, place, and time.  Skin: Skin is warm and dry. No rash noted. She is not diaphoretic. No erythema.  Psychiatric: She has a normal mood and affect.    ED Course  Procedures (including critical care time)  IV morphine and zofran.    Date: 11/25/2011  Rate: 91  Rhythm: normal sinus  rhythm  QRS Axis: right  Intervals: normal  ST/T Wave abnormalities: normal  Conduction Disutrbances:none  Narrative Interpretation: non provocative EKG with no significant changes compared to Jan 18, 2009  Old EKG Reviewed: unchanged   Labs Reviewed  CBC - Abnormal; Notable for the following:    WBC 19.6 (*)    All other components within normal limits  DIFFERENTIAL - Abnormal; Notable for the following:    Neutrophils Relative 89 (*)    Neutro Abs 17.4 (*)    Lymphocytes Relative 5 (*)    Monocytes Absolute 1.1 (*)    All other components within normal limits  BASIC METABOLIC PANEL - Abnormal; Notable for the following:    Glucose, Bld 142 (*)    GFR calc non Af Amer 50 (*)    GFR calc Af Amer 57 (*)    All other components within normal limits  URINALYSIS, ROUTINE W REFLEX MICROSCOPIC - Abnormal; Notable for the following:    APPearance CLOUDY (*)    Hgb urine dipstick TRACE (*)    All other components within normal limits  URINE MICROSCOPIC-ADD ON - Abnormal; Notable for the following:    Squamous Epithelial / LPF FEW (*)    Bacteria, UA MANY (*)    All other components within normal limits  PROTIME-INR  APTT  URINE CULTURE  URINALYSIS, ROUTINE W REFLEX MICROSCOPIC  URINE CULTURE   Dg Chest 2 View  11/25/2011  *RADIOLOGY REPORT*  Clinical Data: Left hip fracture  CHEST - 2 VIEW  Comparison: March 11, 2009  Findings: There is mild pulmonary edema superimposed on chronic interstitial fibrotic disease.  Mild cardiomegaly is unchanged.  No focal infiltrates or effusions are present.  Degenerative changes are noted at both shoulders. Mid - lower thoracic wedge compression fractures are unchanged.  IMPRESSION: Mild pulmonary edema superimposed upon chronic interstitial fibrotic disease.  Original Report Authenticated By: Brandon Melnick, M.D.   Dg Hip Complete Left  11/25/2011  *RADIOLOGY REPORT*  Clinical Data: Pain after fall  LEFT HIP - COMPLETE 2+ VIEW  Comparison: May 09, 2011  Findings: There is a transverse fracture through the left femoral neck.  The femoral head remains normally located within the acetabulum.  There is joint compartment narrowing, consistent with osteoarthritis at the left hip joint.  A right total hip prosthesis is present.  No pelvic fractures are identified.  IMPRESSION: Transverse fracture through the left femoral neck.  Original Report Authenticated By: Brandon Melnick, M.D.     1. Hip fracture   2. Hypertension   3. CAD (coronary artery disease)       MDM  [facility] #3 [place] to admit with Dr. Jillyn Hidden to consult for hip fracture. Temp admit orders written. LLE is neurovasc intact. Pain well controlled and will continue to monitor while in the ER.        Jenness Corner, [place] 11/25/11 1011

## 2011-11-25 NOTE — ED Notes (Signed)
Report called to [id number] 

## 2011-11-25 NOTE — ED Notes (Signed)
Dr.DeLo given copy of ekg and ekg from [facility].

## 2011-11-25 NOTE — Consult Note (Signed)
Reason for Consult:left hip pain Referring Physician: EDP  [name] is an 78 y.o. female.  HPI: [name] this AM C/O pain left hip  Past Medical History  Diagnosis Date  . CAD (coronary artery disease)   . GERD (gastroesophageal reflux disease)   . Hypertension   . Diverticulosis   . Hyperlipidemia   . Gastric peptic ulcer   . Allergy   . Anxiety   . Arthritis   . Blood transfusion     when had my hip replaced  . Cataract     removed bil eyes  . Depression   . Osteoporosis   . Stroke     TIA  . Irregular heart beat     Past Surgical History  Procedure Date  . Stomach ulcer   . Replacement total hip w/  resurfacing implants     right hip  . Coronary stent placement   . Kidney stent   . Tonsillectomy   . Bil cataracts removed   . Colonoscopy     Family History  Problem Relation Age of Onset  . Uterine cancer Sister   . Heart disease Sister     and brother  . Diabetes Son   . Esophageal cancer Neg Hx   . Colon cancer Neg Hx   . Stomach cancer Neg Hx   . Hypertension Mother   . Hypertension Father     Social History:  reports that she has never smoked. She has never used smokeless tobacco. She reports that she does not drink alcohol or use illicit drugs.  Allergies: No Known Allergies  Medications: I have reviewed the patient's current medications.  Results for orders placed during the hospital encounter of 11/25/11 (from the past 48 hour(s))  CBC     Status: Abnormal   Collection Time   11/25/11  8:12 AM      Component Value Range Comment   WBC 19.6 (*) 4.0 - 10.5 (K/uL)    RBC 4.21  3.87 - 5.11 (MIL/uL)    Hemoglobin 13.3  12.0 - 15.0 (g/dL)    HCT 16.1  09.6 - 04.5 (%)    MCV 94.1  78.0 - 100.0 (fL)    MCH 31.6  26.0 - 34.0 (pg)    MCHC 33.6  30.0 - 36.0 (g/dL)    RDW 40.9  81.1 - 91.4 (%)    Platelets 167  150 - 400 (K/uL)   DIFFERENTIAL     Status: Abnormal   Collection Time   11/25/11  8:12 AM      Component Value Range Comment   Neutrophils  Relative 89 (*) 43 - 77 (%)    Neutro Abs 17.4 (*) 1.7 - 7.7 (K/uL)    Lymphocytes Relative 5 (*) 12 - 46 (%)    Lymphs Abs 1.0  0.7 - 4.0 (K/uL)    Monocytes Relative 6  3 - 12 (%)    Monocytes Absolute 1.1 (*) 0.1 - 1.0 (K/uL)    Eosinophils Relative 1  0 - 5 (%)    Eosinophils Absolute 0.1  0.0 - 0.7 (K/uL)    Basophils Relative 0  0 - 1 (%)    Basophils Absolute 0.0  0.0 - 0.1 (K/uL)   BASIC METABOLIC PANEL     Status: Abnormal   Collection Time   11/25/11  8:12 AM      Component Value Range Comment   Sodium 142  135 - 145 (mEq/L)    Potassium 3.8  3.5 -  5.1 (mEq/L)    Chloride 102  96 - 112 (mEq/L)    CO2 28  19 - 32 (mEq/L)    Glucose, Bld 142 (*) 70 - 99 (mg/dL)    BUN 22  6 - 23 (mg/dL)    Creatinine, Ser 4.09  0.50 - 1.10 (mg/dL)    Calcium 81.1  8.4 - 10.5 (mg/dL)    GFR calc non Af Amer 50 (*) >90 (mL/min)    GFR calc Af Amer 57 (*) >90 (mL/min)   PROTIME-INR     Status: Normal   Collection Time   11/25/11  8:12 AM      Component Value Range Comment   Prothrombin Time 13.8  11.6 - 15.2 (seconds)    INR 1.04  0.00 - 1.49    APTT     Status: Normal   Collection Time   11/25/11  8:12 AM      Component Value Range Comment   aPTT 29  24 - 37 (seconds)   URINALYSIS, ROUTINE W REFLEX MICROSCOPIC     Status: Abnormal   Collection Time   11/25/11  8:28 AM      Component Value Range Comment   Color, Urine YELLOW  YELLOW     APPearance CLOUDY (*) CLEAR     Specific Gravity, Urine 1.010  1.005 - 1.030     pH 8.0  5.0 - 8.0     Glucose, UA NEGATIVE  NEGATIVE (mg/dL)    Hgb urine dipstick TRACE (*) NEGATIVE     Bilirubin Urine NEGATIVE  NEGATIVE     Ketones, ur NEGATIVE  NEGATIVE (mg/dL)    Protein, ur NEGATIVE  NEGATIVE (mg/dL)    Urobilinogen, UA 0.2  0.0 - 1.0 (mg/dL)    Nitrite NEGATIVE  NEGATIVE     Leukocytes, UA NEGATIVE  NEGATIVE    URINE MICROSCOPIC-ADD ON     Status: Abnormal   Collection Time   11/25/11  8:28 AM      Component Value Range Comment   Squamous  Epithelial / LPF FEW (*) RARE     WBC, UA 0-2  <3 (WBC/hpf)    Bacteria, UA MANY (*) RARE     Urine-Other AMORPHOUS URATES/PHOSPHATES       Dg Chest 2 View  11/25/2011  *RADIOLOGY REPORT*  Clinical Data: Left hip fracture  CHEST - 2 VIEW  Comparison: March 11, 2009  Findings: There is mild pulmonary edema superimposed on chronic interstitial fibrotic disease.  Mild cardiomegaly is unchanged.  No focal infiltrates or effusions are present.  Degenerative changes are noted at both shoulders. Mid - lower thoracic wedge compression fractures are unchanged.  IMPRESSION: Mild pulmonary edema superimposed upon chronic interstitial fibrotic disease.  Original Report Authenticated By: Brandon Melnick, M.D.   Dg Hip Complete Left  11/25/2011  *RADIOLOGY REPORT*  Clinical Data: Pain after fall  LEFT HIP - COMPLETE 2+ VIEW  Comparison: May 09, 2011  Findings: There is a transverse fracture through the left femoral neck.  The femoral head remains normally located within the acetabulum.  There is joint compartment narrowing, consistent with osteoarthritis at the left hip joint.  A right total hip prosthesis is present.  No pelvic fractures are identified.  IMPRESSION: Transverse fracture through the left femoral neck.  Original Report Authenticated By: Brandon Melnick, M.D.    Review of Systems  Constitutional: Negative.   HENT: Negative.   Eyes: Negative.   Respiratory: Negative.   Cardiovascular: Negative.   Musculoskeletal:  Positive for joint pain.  Skin: Negative.   Neurological: Negative.   Endo/Heme/Allergies: Negative.   Psychiatric/Behavioral: Negative.    Blood pressure 170/83, pulse 80, temperature 99.1 F (37.3 C), temperature source Oral, resp. rate 15, SpO2 100.00%. Physical Exam  Vitals reviewed. Constitutional: She appears distressed.  HENT:  Head: Normocephalic.  Eyes: Pupils are equal, round, and reactive to light.  Neck: Normal range of motion.  Cardiovascular: Normal rate.     Respiratory: Effort normal.  Musculoskeletal: She exhibits tenderness.       Left leg shortened and ER. 1+ DP PT No DVT. Pain with ROM hip left.  Neurological: She is alert.  Skin: Skin is warm.    Assessment/Plan: Displaced left femoral neck fracture. Plan hemiarthroplasty. Risks discussed.  Chloey Ricard C 11/25/2011, 11:34 AM

## 2011-11-25 NOTE — ED Notes (Signed)
To ED via [facility] for eval after falling at home last night about 9:30p. Pt tripped over a bag, landing on her left hip. Neighbor helped pt to chair. Pt was able transfer from chair to bed during the night. Noted left leg shortening. Denies neck or back pain. Pedal pulses WNL. Pt is alert and oriented x4.

## 2011-11-25 NOTE — H&P (Signed)
Hospital Admission Note Date: 11/25/2011  Patient name: [name]           Medical record number: [id number] Date of birth: 1917-12-31           Age: 78 y.o.   Gender: female    PCP:   [name], MD, MD   Chief Complaint:  Left total hip fracture  HPI: [name] is a 78 y.o. female with past medical history of CAD, stroke and hypertension. Patient has prolonged history of GI problems and GI procedures. Patient came in to the hospital after she fell and broke her left hip. Patient was trying to get out of the bed and she tripped on steps and fell landed on her left side. She felt a lot of pain and she couldn't move. Patient brought to the emergency department here initial evaluation x-ray of left hip showed transverse fracture through the femoral neck. Patient admitted to the hospital for further evaluation.  Past Medical History: Past Medical History  Diagnosis Date  . CAD (coronary artery disease)   . GERD (gastroesophageal reflux disease)   . Hypertension   . Diverticulosis   . Hyperlipidemia   . Gastric peptic ulcer   . Allergy   . Anxiety   . Arthritis   . Blood transfusion     when had my hip replaced  . Cataract     removed bil eyes  . Depression   . Osteoporosis   . Stroke     TIA  . Irregular heart beat    Past Surgical History  Procedure Date  . Stomach ulcer   . Replacement total hip w/  resurfacing implants     right hip  . Coronary stent placement   . Kidney stent   . Tonsillectomy   . Bil cataracts removed   . Colonoscopy   . Joint replacement     Medications: Prior to Admission medications   Medication Sig Start Date End Date Taking? Authorizing Provider  acetaminophen (TYLENOL) 325 MG tablet Take 650 mg by mouth 2 (two) times daily.   Yes Historical Provider, MD  ALPRAZolam Prudy Feeler) 0.25 MG tablet Take 0.25 mg by mouth at bedtime as needed. For sleep   Yes Historical Provider, MD  calcium gluconate 500 MG tablet Take 500 mg by mouth  daily.     Yes Historical Provider, MD  docusate sodium (COLACE) 100 MG capsule Take 100 mg by mouth 2 (two) times daily.     Yes Historical Provider, MD  estradiol (ESTRACE) 0.5 MG tablet Take 0.5 mg by mouth daily.     Yes Historical Provider, MD  fish oil-omega-3 fatty acids 1000 MG capsule Take 2 g by mouth daily.     Yes Historical Provider, MD  folic acid (FOLVITE) 1 MG tablet Take 1 mg by mouth daily.     Yes Historical Provider, MD  losartan (COZAAR) 50 MG tablet Take 50 mg by mouth daily.     Yes Historical Provider, MD  mirtazapine (REMERON) 15 MG tablet Take 15 mg by mouth at bedtime.     Yes Historical Provider, MD  Multiple Vitamin (MULTIVITAMIN) tablet Take 1 tablet by mouth daily.     Yes Historical Provider, MD  pantoprazole (PROTONIX) 40 MG tablet Take 40 mg by mouth 2 (two) times daily.     Yes Historical Provider, MD  simethicone (MYLICON) 125 MG chewable tablet Chew 125 mg by mouth every 6 (six) hours as needed. For gas relieve   Yes  Historical Provider, MD  temazepam (RESTORIL) 15 MG capsule Take 15 mg by mouth at bedtime as needed. For sleep   Yes Historical Provider, MD  triamterene-hydrochlorothiazide (DYAZIDE) 37.5-25 MG per capsule Take 1 capsule by mouth daily as needed. For blood pressure   Yes Historical Provider, MD    Allergies:  No Known Allergies  Social History:  reports that she has never smoked. She has never used smokeless tobacco. She reports that she does not drink alcohol or use illicit drugs.  Family History: Family History  Problem Relation Age of Onset  . Uterine cancer Sister   . Heart disease Sister     and brother  . Diabetes Son   . Esophageal cancer Neg Hx   . Colon cancer Neg Hx   . Stomach cancer Neg Hx   . Hypertension Mother   . Hypertension Father     Review of Systems:  Constitutional: negative for anorexia, fevers and sweats Eyes: negative for irritation, redness and visual disturbance Ears, nose, mouth, throat, and face:  negative for earaches, epistaxis, nasal congestion and sore throat Respiratory: negative for cough, dyspnea on exertion, sputum and wheezing Cardiovascular: negative for chest pain, dyspnea, lower extremity edema, orthopnea, palpitations and syncope Gastrointestinal: negative for abdominal pain, constipation, diarrhea, melena, nausea and vomiting Genitourinary:negative for dysuria, frequency and hematuria Hematologic/lymphatic: negative for bleeding, easy bruising and lymphadenopathy Musculoskeletal:negative for arthralgias, muscle weakness and stiff joints Neurological: negative for coordination problems, gait problems, headaches and weakness Endocrine: negative for diabetic symptoms including polydipsia, polyuria and weight loss Allergic/Immunologic: negative for anaphylaxis, hay fever and urticaria  Physical Exam: BP 170/83  Pulse 80  Temp(Src) 99.1 F (37.3 C) (Oral)  Resp 15  SpO2 100% General appearance: alert, cooperative and no distress  Head: Normocephalic, without obvious abnormality, atraumatic  Eyes: conjunctivae/corneas clear. PERRL, EOM's intact. Fundi benign.  Nose: Nares normal. Septum midline. Mucosa normal. No drainage or sinus tenderness.  Throat: lips, mucosa, and tongue normal; teeth and gums normal  Neck: Supple, no masses, no cervical lymphadenopathy, no JVD appreciated, no meningeal signs Resp: clear to auscultation bilaterally  Chest wall: no tenderness  Cardio: regular rate and rhythm, S1, S2 normal, no murmur, click, rub or gallop  GI: soft, non-tender; bowel sounds normal; no masses, no organomegaly  Extremities: extremities normal, atraumatic, no cyanosis or edema  Skin: Skin color, texture, turgor normal. No rashes or lesions  Neurologic: Alert and oriented X 3, normal strength and tone. Normal symmetric reflexes. Normal coordination and gait  Labs on Admission:   [facility] 11/25/11 0812  NA 142  K 3.8  CL 102  CO2 28  GLUCOSE 142*  BUN 22    CREATININE 0.97  CALCIUM 10.1  MG --  PHOS --   No results found for this basename: AST:2,ALT:2,ALKPHOS:2,BILITOT:2,PROT:2,ALBUMIN:2 in the last 72 hours No results found for this basename: LIPASE:2,AMYLASE:2 in the last 72 hours  Basename 11/25/11 0812  WBC 19.6*  NEUTROABS 17.4*  HGB 13.3  HCT 39.6  MCV 94.1  PLT 167   No results found for this basename: CKTOTAL:3,CKMB:3,CKMBINDEX:3,TROPONINI:3 in the last 72 hours No results found for this basename: TSH,T4TOTAL,FREET3,T3FREE,THYROIDAB in the last 72 hours No results found for this basename: VITAMINB12:2,FOLATE:2,FERRITIN:2,TIBC:2,IRON:2,RETICCTPCT:2 in the last 72 hours  Radiological Exams on Admission: Dg Chest 2 View  11/25/2011  *RADIOLOGY REPORT*  Clinical Data: Left hip fracture  CHEST - 2 VIEW  Comparison: March 11, 2009  Findings: There is mild pulmonary edema superimposed on chronic interstitial fibrotic disease.  Mild cardiomegaly is unchanged.  No focal infiltrates or effusions are present.  Degenerative changes are noted at both shoulders. Mid - lower thoracic wedge compression fractures are unchanged.  IMPRESSION: Mild pulmonary edema superimposed upon chronic interstitial fibrotic disease.  Original Report Authenticated By: Brandon Melnick, M.D.   Dg Hip Complete Left  11/25/2011  *RADIOLOGY REPORT*  Clinical Data: Pain after fall  LEFT HIP - COMPLETE 2+ VIEW  Comparison: May 09, 2011  Findings: There is a transverse fracture through the left femoral neck.  The femoral head remains normally located within the acetabulum.  There is joint compartment narrowing, consistent with osteoarthritis at the left hip joint.  A right total hip prosthesis is present.  No pelvic fractures are identified.  IMPRESSION: Transverse fracture through the left femoral neck.  Original Report Authenticated By: Brandon Melnick, M.D.     IMPRESSION: Present on Admission:  .Closed left hip fracture .CORONARY ARTERY  DISEASE .Fall .HYPERTENSION  Assessment/Plan  Left hip fracture Dr. Shelle Iron from orthopedics already saw the patient, plans for hemiarthroplasty noted for the afternoon. Adequate pain control ordered. Patient is on SCDs for DVT prophylaxis. I'll let orthopedics decide about the choice of anticoagulation after the surgery.  Fall From the scenario that patient is telling, this looks like mechanical fall. Patient and family denies any type of prodrome before the fall, she denies any chest pain, palpitations, shortness of breath. Patient said she simply tripped over a step.  Atrial fibrillation Question about atrial fibrillation, per nursing staff the telemetry showed atrial fibrillation, patient had 12-lead EKG in the emergency department showed sinus rhythm. I will repeat the 12-lead EKG.  CAD Patient is not aware of any history of MI, upon record review patient had LAD and RCA stents placed in April of 2007. The stent were patent according to last cardiac catheterization done on 01/17/2009.  Cardiac risk stratification Patient is mild to moderate risk to develop cardiac complications. This is because she had history of coronary artery disease. Patient does not have any active angina symptoms, no CHF, no history of diabetes, ventricular arrhythmias or CKD. Patient will be appropriate to proceed to her hip surgery without any intervention or workup. She is not on beta blocker, she'll be on when necessary beta blocker for heart rate control.  Luanne Krzyzanowski A 11/25/2011, 12:58 PM

## 2011-11-26 ENCOUNTER — Inpatient Hospital Stay (HOSPITAL_COMMUNITY): Payer: Medicare Other | Admitting: Anesthesiology

## 2011-11-26 ENCOUNTER — Encounter (HOSPITAL_COMMUNITY): Payer: Self-pay | Admitting: Anesthesiology

## 2011-11-26 ENCOUNTER — Inpatient Hospital Stay (HOSPITAL_COMMUNITY): Payer: Medicare Other

## 2011-11-26 ENCOUNTER — Encounter (HOSPITAL_COMMUNITY)
Admission: EM | Disposition: A | Discharge status: Skilled Nursing Facility | Payer: Self-pay | Source: Home / Self Care | Attending: Internal Medicine

## 2011-11-26 DIAGNOSIS — W19XXXA Unspecified fall, initial encounter: Secondary | ICD-10-CM

## 2011-11-26 DIAGNOSIS — I2510 Atherosclerotic heart disease of native coronary artery without angina pectoris: Secondary | ICD-10-CM

## 2011-11-26 DIAGNOSIS — I10 Essential (primary) hypertension: Secondary | ICD-10-CM

## 2011-11-26 DIAGNOSIS — S72009A Fracture of unspecified part of neck of unspecified femur, initial encounter for closed fracture: Secondary | ICD-10-CM

## 2011-11-26 HISTORY — PX: HIP ARTHROPLASTY: SHX981

## 2011-11-26 LAB — CBC
HCT: 26.4 % — ABNORMAL LOW (ref 36.0–46.0)
Hemoglobin: 11.1 g/dL — ABNORMAL LOW (ref 12.0–15.0)
MCH: 31.9 pg (ref 26.0–34.0)
MCHC: 33.3 g/dL (ref 30.0–36.0)
MCV: 95.7 fL (ref 78.0–100.0)
MCV: 95 fL (ref 78.0–100.0)
Platelets: 117 10*3/uL — ABNORMAL LOW (ref 150–400)
Platelets: 90 10*3/uL — ABNORMAL LOW (ref 150–400)
RBC: 3.48 MIL/uL — ABNORMAL LOW (ref 3.87–5.11)
RDW: 15.2 % (ref 11.5–15.5)
WBC: 14.6 10*3/uL — ABNORMAL HIGH (ref 4.0–10.5)
WBC: 8.9 10*3/uL (ref 4.0–10.5)

## 2011-11-26 LAB — COMPREHENSIVE METABOLIC PANEL
AST: 22 U/L (ref 0–37)
Albumin: 2.8 g/dL — ABNORMAL LOW (ref 3.5–5.2)
BUN: 29 mg/dL — ABNORMAL HIGH (ref 6–23)
Calcium: 9.1 mg/dL (ref 8.4–10.5)
Creatinine, Ser: 1.23 mg/dL — ABNORMAL HIGH (ref 0.50–1.10)
Total Bilirubin: 0.3 mg/dL (ref 0.3–1.2)

## 2011-11-26 LAB — BASIC METABOLIC PANEL
BUN: 23 mg/dL (ref 6–23)
Calcium: 8.4 mg/dL (ref 8.4–10.5)
Chloride: 103 mEq/L (ref 96–112)
Creatinine, Ser: 0.99 mg/dL (ref 0.50–1.10)
GFR calc Af Amer: 56 mL/min — ABNORMAL LOW (ref >90)

## 2011-11-26 LAB — SURGICAL PCR SCREEN
MRSA, PCR: NEGATIVE
Staphylococcus aureus: POSITIVE — AB

## 2011-11-26 LAB — HEMOGLOBIN AND HEMATOCRIT, BLOOD: HCT: 29.8 % — ABNORMAL LOW (ref 36.0–46.0)

## 2011-11-26 SURGERY — HEMIARTHROPLASTY, HIP, DIRECT ANTERIOR APPROACH, FOR FRACTURE
Anesthesia: General | Site: Hip | Laterality: Left | Wound class: Clean

## 2011-11-26 MED ORDER — LACTATED RINGERS IV SOLN
INTRAVENOUS | Status: DC | PRN
  Administered 2011-11-26: 12:00:00 via INTRAVENOUS

## 2011-11-26 MED ORDER — CEFAZOLIN SODIUM 1-5 GM-% IV SOLN
INTRAVENOUS | Status: AC
  Filled 2011-11-26: qty 50

## 2011-11-26 MED ORDER — MENTHOL 3 MG MT LOZG
1.0000 | LOZENGE | OROMUCOSAL | Status: DC | PRN
  Filled 2011-11-26: qty 9

## 2011-11-26 MED ORDER — BISACODYL 10 MG RE SUPP
10.0000 mg | Freq: Every day | RECTAL | Status: DC | PRN
  Administered 2011-11-28 – 2011-11-30 (×2): 10 mg via RECTAL
  Filled 2011-11-26 (×2): qty 1

## 2011-11-26 MED ORDER — AMLODIPINE BESYLATE 5 MG PO TABS
5.0000 mg | ORAL_TABLET | Freq: Every day | ORAL | Status: DC
  Administered 2011-11-26: 5 mg via ORAL
  Filled 2011-11-26: qty 1

## 2011-11-26 MED ORDER — METOCLOPRAMIDE HCL 5 MG PO TABS
5.0000 mg | ORAL_TABLET | Freq: Three times a day (TID) | ORAL | Status: DC | PRN
  Filled 2011-11-26: qty 2

## 2011-11-26 MED ORDER — PROPOFOL 10 MG/ML IV EMUL
INTRAVENOUS | Status: DC | PRN
  Administered 2011-11-26: 100 mg via INTRAVENOUS

## 2011-11-26 MED ORDER — SODIUM CHLORIDE 0.9 % IR SOLN
Status: DC | PRN
  Administered 2011-11-26: 1000 mL

## 2011-11-26 MED ORDER — ACETAMINOPHEN 650 MG RE SUPP: 650.0000 mg | Freq: Four times a day (QID) | RECTAL | Status: DC | PRN

## 2011-11-26 MED ORDER — PHENOL 1.4 % MT LIQD
1.0000 | OROMUCOSAL | Status: DC | PRN
  Filled 2011-11-26: qty 177

## 2011-11-26 MED ORDER — ONDANSETRON HCL 4 MG PO TABS: 4.0000 mg | ORAL_TABLET | Freq: Four times a day (QID) | ORAL | Status: DC | PRN

## 2011-11-26 MED ORDER — ONDANSETRON HCL 4 MG/2ML IJ SOLN
INTRAMUSCULAR | Status: DC | PRN
  Administered 2011-11-26: 4 mg via INTRAVENOUS

## 2011-11-26 MED ORDER — SODIUM CHLORIDE 0.9 % IV SOLN
Freq: Once | INTRAVENOUS | Status: AC
  Administered 2011-11-26: 22:00:00 via INTRAVENOUS

## 2011-11-26 MED ORDER — METOCLOPRAMIDE HCL 5 MG/ML IJ SOLN
5.0000 mg | Freq: Three times a day (TID) | INTRAMUSCULAR | Status: DC | PRN
  Filled 2011-11-26: qty 2

## 2011-11-26 MED ORDER — ENOXAPARIN SODIUM 30 MG/0.3ML ~~LOC~~ SOLN
30.0000 mg | SUBCUTANEOUS | Status: DC
  Filled 2011-11-26: qty 0.3

## 2011-11-26 MED ORDER — LIDOCAINE HCL (CARDIAC) 20 MG/ML IV SOLN
INTRAVENOUS | Status: DC | PRN
  Administered 2011-11-26: 100 mg via INTRAVENOUS

## 2011-11-26 MED ORDER — CHLORHEXIDINE GLUCONATE CLOTH 2 % EX PADS
6.0000 | MEDICATED_PAD | Freq: Every day | CUTANEOUS | Status: DC
  Administered 2011-11-26: 6 via TOPICAL

## 2011-11-26 MED ORDER — NEOSTIGMINE METHYLSULFATE 1 MG/ML IJ SOLN
INTRAMUSCULAR | Status: DC | PRN
  Administered 2011-11-26: 4 mg via INTRAVENOUS

## 2011-11-26 MED ORDER — ONDANSETRON HCL 4 MG/2ML IJ SOLN: 4.0000 mg | Freq: Once | INTRAMUSCULAR | Status: DC | PRN

## 2011-11-26 MED ORDER — POLYETHYLENE GLYCOL 3350 17 G PO PACK
17.0000 g | PACK | Freq: Every day | ORAL | Status: DC | PRN
  Filled 2011-11-26: qty 1

## 2011-11-26 MED ORDER — HYDROCODONE-ACETAMINOPHEN 5-325 MG PO TABS: 1.0000 | ORAL_TABLET | ORAL | Status: DC | PRN

## 2011-11-26 MED ORDER — HYDROMORPHONE HCL PF 1 MG/ML IJ SOLN
0.2500 mg | INTRAMUSCULAR | Status: DC | PRN
  Administered 2011-11-26 (×2): 0.25 mg via INTRAVENOUS

## 2011-11-26 MED ORDER — MORPHINE SULFATE 4 MG/ML IJ SOLN: 0.0500 mg/kg | INTRAMUSCULAR | Status: DC | PRN

## 2011-11-26 MED ORDER — METHOCARBAMOL 100 MG/ML IJ SOLN
500.0000 mg | Freq: Four times a day (QID) | INTRAVENOUS | Status: DC | PRN
  Filled 2011-11-26: qty 5

## 2011-11-26 MED ORDER — ONDANSETRON HCL 4 MG/2ML IJ SOLN: 4.0000 mg | Freq: Four times a day (QID) | INTRAMUSCULAR | Status: DC | PRN

## 2011-11-26 MED ORDER — EPHEDRINE SULFATE 50 MG/ML IJ SOLN
INTRAMUSCULAR | Status: DC | PRN
  Administered 2011-11-26: 10 mg via INTRAVENOUS
  Administered 2011-11-26 (×2): 5 mg via INTRAVENOUS

## 2011-11-26 MED ORDER — LACTATED RINGERS IV SOLN
INTRAVENOUS | Status: DC
Start: 2011-11-26 — End: 2011-11-26
  Administered 2011-11-26: 09:00:00 via INTRAVENOUS

## 2011-11-26 MED ORDER — METHOCARBAMOL 500 MG PO TABS
500.0000 mg | ORAL_TABLET | Freq: Four times a day (QID) | ORAL | Status: DC | PRN
  Administered 2011-12-01 – 2011-12-04 (×2): 500 mg via ORAL
  Filled 2011-11-26 (×3): qty 1

## 2011-11-26 MED ORDER — SODIUM CHLORIDE 0.45 % IV SOLN
INTRAVENOUS | Status: DC
  Administered 2011-11-26: 17:00:00 via INTRAVENOUS
  Administered 2011-11-27: 75 mL/h via INTRAVENOUS
  Administered 2011-11-27: 04:00:00 via INTRAVENOUS

## 2011-11-26 MED ORDER — FENTANYL CITRATE 0.05 MG/ML IJ SOLN
INTRAMUSCULAR | Status: DC | PRN
  Administered 2011-11-26 (×2): 50 ug via INTRAVENOUS
  Administered 2011-11-26: 100 ug via INTRAVENOUS

## 2011-11-26 MED ORDER — VECURONIUM BROMIDE 10 MG IV SOLR
INTRAVENOUS | Status: DC | PRN
  Administered 2011-11-26: 10 mg via INTRAVENOUS

## 2011-11-26 MED ORDER — ACETAMINOPHEN 325 MG PO TABS: 650.0000 mg | ORAL_TABLET | Freq: Four times a day (QID) | ORAL | Status: DC | PRN

## 2011-11-26 MED ORDER — CEFAZOLIN SODIUM 1-5 GM-% IV SOLN
1.0000 g | Freq: Four times a day (QID) | INTRAVENOUS | Status: AC
  Administered 2011-11-26 – 2011-11-27 (×3): 1 g via INTRAVENOUS
  Filled 2011-11-26 (×3): qty 50

## 2011-11-26 MED ORDER — MUPIROCIN 2 % EX OINT
1.0000 "application " | TOPICAL_OINTMENT | Freq: Two times a day (BID) | CUTANEOUS | Status: AC
  Administered 2011-11-26 – 2011-11-30 (×7): 1 via NASAL
  Filled 2011-11-26 (×2): qty 22

## 2011-11-26 MED ORDER — SODIUM CHLORIDE 0.9 % IV BOLUS (SEPSIS)
250.0000 mL | Freq: Once | INTRAVENOUS | Status: AC
  Administered 2011-11-26: 250 mL via INTRAVENOUS

## 2011-11-26 MED ORDER — GLYCOPYRROLATE 0.2 MG/ML IJ SOLN
INTRAMUSCULAR | Status: DC | PRN
  Administered 2011-11-26: .6 mg via INTRAVENOUS

## 2011-11-26 SURGICAL SUPPLY — 55 items
APL SKNCLS STERI-STRIP NONHPOA (GAUZE/BANDAGES/DRESSINGS) ×1
BENZOIN TINCTURE PRP APPL 2/3 (GAUZE/BANDAGES/DRESSINGS) ×2 IMPLANT
BLADE SAW SAG 73X25 THK (BLADE) ×1
BLADE SAW SGTL 73X25 THK (BLADE) ×1 IMPLANT
BNDG ADH 5X4 AIR PERM ELC (GAUZE/BANDAGES/DRESSINGS) ×1
BNDG COHESIVE 4X5 WHT NS (GAUZE/BANDAGES/DRESSINGS) ×2 IMPLANT
BRUSH FEMORAL CANAL (MISCELLANEOUS) IMPLANT
CLOTH BEACON ORANGE TIMEOUT ST (SAFETY) ×2 IMPLANT
COVER BACK TABLE 24X17X13 BIG (DRAPES) IMPLANT
DRAPE ORTHO SPLIT 77X108 STRL (DRAPES) ×4
DRAPE SURG ORHT 6 SPLT 77X108 (DRAPES) ×2 IMPLANT
DRAPE U-SHAPE 47X51 STRL (DRAPES) ×2 IMPLANT
DRSG ADAPTIC 3X8 NADH LF (GAUZE/BANDAGES/DRESSINGS) ×2 IMPLANT
DRSG PAD ABDOMINAL 8X10 ST (GAUZE/BANDAGES/DRESSINGS) ×2 IMPLANT
DURAPREP 26ML APPLICATOR (WOUND CARE) ×2 IMPLANT
ELECT BLADE 6.5 EXT (BLADE) IMPLANT
ELECT CAUTERY BLADE 6.4 (BLADE) ×2 IMPLANT
ELECT REM PT RETURN 9FT ADLT (ELECTROSURGICAL) ×2
ELECTRODE REM PT RTRN 9FT ADLT (ELECTROSURGICAL) ×1 IMPLANT
EVACUATOR 1/8 PVC DRAIN (DRAIN) IMPLANT
FILTER STRAW FLUID ASPIR (MISCELLANEOUS) ×2 IMPLANT
GLOVE BIO SURGEON STRL SZ 6.5 (GLOVE) ×2 IMPLANT
GLOVE BIOGEL PI IND STRL 6.5 (GLOVE) ×1 IMPLANT
GLOVE BIOGEL PI INDICATOR 6.5 (GLOVE) ×1
GLOVE SURG SS PI 8.0 STRL IVOR (GLOVE) ×4 IMPLANT
GOWN EXTRA PROTECTION XL (GOWNS) ×2 IMPLANT
GOWN PREVENTION PLUS XLARGE (GOWN DISPOSABLE) ×1 IMPLANT
GOWN STRL NON-REIN LRG LVL3 (GOWN DISPOSABLE) ×4 IMPLANT
HANDPIECE INTERPULSE COAX TIP (DISPOSABLE)
IMMOBILIZER KNEE 22 UNIV (SOFTGOODS) ×2 IMPLANT
KIT BASIN OR (CUSTOM PROCEDURE TRAY) ×2 IMPLANT
KIT ROOM TURNOVER OR (KITS) ×2 IMPLANT
MANIFOLD NEPTUNE II (INSTRUMENTS) ×2 IMPLANT
NDL MAYO TROCAR (NEEDLE) ×1 IMPLANT
NEEDLE MAYO TROCAR (NEEDLE) ×2 IMPLANT
NS IRRIG 1000ML POUR BTL (IV SOLUTION) ×2 IMPLANT
PACK TOTAL JOINT (CUSTOM PROCEDURE TRAY) ×2 IMPLANT
PAD ARMBOARD 7.5X6 YLW CONV (MISCELLANEOUS) ×4 IMPLANT
SET HNDPC FAN SPRY TIP SCT (DISPOSABLE) IMPLANT
SPONGE GAUZE 4X4 12PLY (GAUZE/BANDAGES/DRESSINGS) ×2 IMPLANT
SPONGE LAP 4X18 X RAY DECT (DISPOSABLE) ×2 IMPLANT
SUCTION FRAZIER TIP 10 FR DISP (SUCTIONS) ×2 IMPLANT
SUT ETHIBOND NAB CT1 #1 30IN (SUTURE) ×8 IMPLANT
SUT VIC AB 0 CTXB 36 (SUTURE) ×4 IMPLANT
SUT VIC AB 1 CTX 36 (SUTURE) ×4
SUT VIC AB 1 CTX36XBRD ANBCTR (SUTURE) ×2 IMPLANT
SUT VIC AB 2-0 CTB1 (SUTURE) ×8 IMPLANT
SUT VLOC 180 0 24IN GS25 (SUTURE) ×1 IMPLANT
SYR 5ML LL (SYRINGE) ×2 IMPLANT
TOWEL OR 17X24 6PK STRL BLUE (TOWEL DISPOSABLE) ×2 IMPLANT
TOWEL OR 17X26 10 PK STRL BLUE (TOWEL DISPOSABLE) ×2 IMPLANT
TOWER CARTRIDGE SMART MIX (DISPOSABLE) IMPLANT
TRAY FOLEY CATH 14FR (SET/KITS/TRAYS/PACK) IMPLANT
TUBE CONNECTING 12X1/4 (SUCTIONS) ×1 IMPLANT
WATER STERILE IRR 1000ML POUR (IV SOLUTION) ×6 IMPLANT

## 2011-11-26 NOTE — Care Management Note (Signed)
    Page 1 of 1   11/27/2011     2:18:17 PM   CARE MANAGEMENT NOTE 11/27/2011  Patient:  [name],[name]   Account Number:  [id number]  Date Initiated:  11/26/2011  Documentation initiated by:  Darlyne Russian  Subjective/Objective Assessment:   Patient admitted with left hip fracture s/p fall.     Action/Plan:   Progression of care and discharge planning   Anticipated DC Date:  11/30/2011   Anticipated DC Plan:        DC Planning Services  CM consult      Choice offered to / List presented to:             Status of service:  In process, will continue to follow Medicare Important Message given?   (If response is "NO", the following Medicare IM given date fields will be blank) Date Medicare IM given:   Date Additional Medicare IM given:    Discharge Disposition:    Per UR Regulation:  Reviewed for med. necessity/level of care/duration of stay  If discussed at Long Length of Stay Meetings, dates discussed:    Comments:  11/27/11 Onnie Boer, RN, BSN 1416 PT/OT EVAL SUGGEST CIR, ATTENDING TO PLACE ORDER TO CONSULT TODAY.  PT AND FAMILY VERY HOPEFUL FOR CIR PLACEMENT.  11/26/11 Onnie Boer, RN, BSN 1638 PT TO HAVE SURG TODAY TO REPAIR HIP.  FAMILY WANTS CIR.  PT IS FROM HOME WITH FAMILY CARE.  PT WILL HAVE 24 HR SUPPORT. WILL F/U WITH [facility] AFTER SURGERY FOR CIR CONSULT.  11/26/2011 [address] RN, [place]  [phone] Utilization review completed.

## 2011-11-26 NOTE — Anesthesia Procedure Notes (Signed)
Procedure Name: Intubation Date/Time: 11/26/2011 12:47 PM Performed by: Carmela Rima Pre-anesthesia Checklist: Patient identified, Timeout performed, Emergency Drugs available, Suction available and Patient being monitored Patient Re-evaluated:Patient Re-evaluated prior to inductionOxygen Delivery Method: Circle system utilized Preoxygenation: Pre-oxygenation with 100% oxygen Intubation Type: IV induction Ventilation: Mask ventilation without difficulty Laryngoscope Size: Mac and 4 Grade View: Grade I Tube type: Oral Tube size: 7.5 mm Number of attempts: 1 Placement Confirmation: ETT inserted through vocal cords under direct vision,  breath sounds checked- equal and bilateral and positive ETCO2 Secured at: 21 cm Tube secured with: Tape Dental Injury: Teeth and Oropharynx as per pre-operative assessment

## 2011-11-26 NOTE — Progress Notes (Signed)
Subjective: Patient seen and examined, complaining of left hip pain  Objective: Vital signs in last 24 hours: Temp:  [98.6 F (37 C)-99.6 F (37.6 C)] 98.8 F (37.1 C) (05/06 0500) Pulse Rate:  [45-72] 72  (05/06 0500) Resp:  [18-20] 18  (05/06 0500) BP: (90-138)/(48-78) 129/68 mmHg (05/06 0500) SpO2:  [95 %-97 %] 97 % (05/06 0500) Weight:  [60.7 kg (133 lb 13.1 oz)-62.6 kg (138 lb 0.1 oz)] 62.6 kg (138 lb 0.1 oz) (05/06 0300) Weight change:  Last BM Date: 11/24/11  Intake/Output from previous day: 05/05 0701 - 05/06 0700 In: -  Out: 1200 [Urine:1200] Total I/O In: -  Out: 200 [Urine:200]   Physical Exam: General: Alert, awake, oriented x3, in no acute distress. Heart: Regular rate and rhythm, without murmurs, rubs, gallops. Lungs: Clear to auscultation bilaterally. Abdomen: Soft, nontender, nondistended, positive bowel sounds. Extremities: No clubbing cyanosis or edema with positive pedal pulses. Neuro: Grossly intact, nonfocal. Deferred left lower extremity exam    Lab Results: Results for orders placed during the hospital encounter of 11/25/11 (from the past 24 hour(s))  HEMOGLOBIN A1C     Status: Abnormal   Collection Time   11/25/11  1:11 PM      Component Value Range   Hemoglobin A1C 6.0 (*) <5.7 (%)   Mean Plasma Glucose 126 (*) <117 (mg/dL)  TSH     Status: Normal   Collection Time   11/25/11  1:11 PM      Component Value Range   TSH 4.147  0.350 - 4.500 (uIU/mL)  PRO B NATRIURETIC PEPTIDE     Status: Abnormal   Collection Time   11/25/11  1:11 PM      Component Value Range   Pro B Natriuretic peptide (BNP) 5334.0 (*) 0 - 450 (pg/mL)  COMPREHENSIVE METABOLIC PANEL     Status: Abnormal   Collection Time   11/26/11  5:25 AM      Component Value Range   Sodium 139  135 - 145 (mEq/L)   Potassium 3.9  3.5 - 5.1 (mEq/L)   Chloride 102  96 - 112 (mEq/L)   CO2 30  19 - 32 (mEq/L)   Glucose, Bld 123 (*) 70 - 99 (mg/dL)   BUN 29 (*) 6 - 23 (mg/dL)   Creatinine,  Ser 1.19 (*) 0.50 - 1.10 (mg/dL)   Calcium 9.1  8.4 - 14.7 (mg/dL)   Total Protein 6.2  6.0 - 8.3 (g/dL)   Albumin 2.8 (*) 3.5 - 5.2 (g/dL)   AST 22  0 - 37 (U/L)   ALT 14  0 - 35 (U/L)   Alkaline Phosphatase 67  39 - 117 (U/L)   Total Bilirubin 0.3  0.3 - 1.2 (mg/dL)   GFR calc non Af Amer 37 (*) >90 (mL/min)   GFR calc Af Amer 43 (*) >90 (mL/min)  CBC     Status: Abnormal   Collection Time   11/26/11  5:25 AM      Component Value Range   WBC 14.6 (*) 4.0 - 10.5 (K/uL)   RBC 3.48 (*) 3.87 - 5.11 (MIL/uL)   Hemoglobin 11.1 (*) 12.0 - 15.0 (g/dL)   HCT 82.9 (*) 56.2 - 46.0 (%)   MCV 95.7  78.0 - 100.0 (fL)   MCH 31.9  26.0 - 34.0 (pg)   MCHC 33.3  30.0 - 36.0 (g/dL)   RDW 13.0  86.5 - 78.4 (%)   Platelets 117 (*) 150 - 400 (K/uL)  SURGICAL PCR  SCREEN     Status: Abnormal   Collection Time   11/26/11  6:02 AM      Component Value Range   MRSA, PCR NEGATIVE  NEGATIVE    Staphylococcus aureus POSITIVE (*) NEGATIVE     Studies/Results:   Medications:    . acetaminophen  650 mg Oral BID  .  ceFAZolin (ANCEF) IV  1 g Intravenous Once  . Chlorhexidine Gluconate Cloth  6 each Topical Daily  . docusate sodium  100 mg Oral BID  . folic acid  1 mg Oral Daily  . furosemide  40 mg Intravenous Once  . losartan  50 mg Oral Daily  . mirtazapine  15 mg Oral QHS  . mulitivitamin with minerals  1 tablet Oral Daily  . mupirocin ointment  1 application Nasal BID  . pantoprazole  40 mg Oral BID  . DISCONTD: sodium chloride   Intravenous STAT  . DISCONTD: furosemide  40 mg Intravenous Once  . DISCONTD: mirtazapine  15 mg Oral QHS  . DISCONTD: multivitamin  1 tablet Oral Daily    acetaminophen, acetaminophen, ALPRAZolam, HYDROcodone-acetaminophen, HYDROmorphone (DILAUDID) injection, ondansetron (ZOFRAN) IV, ondansetron, DISCONTD:  HYDROmorphone (DILAUDID) injection, DISCONTD: ondansetron (ZOFRAN) IV     . lactated ringers 50 mL/hr at 11/26/11 6213    Assessment/Plan:  Fall/Closed  left hip fracture: Scheduled for surgery today. Pain control, PT after surgery. Active Problems:  HYPERTENSION Well controlled, was on losartan, we switched to amlodipine given acute kidney injury. Acute on chronic kidney injury: Possibly secondary to diuresis, would hold losartan and Lasix and observe.  ?CHF : Elevated pro BNP and mild pulmonary edema on chest x-ray. Check echocardiogram  CORONARY ARTERY DISEASE: Stable   DVT prophylaxis: As per ortho service      LOS: 1 day   [name] 11/26/2011, 11:34 AM

## 2011-11-26 NOTE — Progress Notes (Signed)
Subjective:   Procedure(s) (LRB): ARTHROPLASTY BIPOLAR HIP (Left) planned for today Patient reports pain as mild.    Denies CP or SOB Objective: Vital signs in last 24 hours: Temp:  [98.6 F (37 C)-99.6 F (37.6 C)] 98.8 F (37.1 C) (05/06 0500) Pulse Rate:  [45-80] 72  (05/06 0500) Resp:  [15-20] 18  (05/06 0500) BP: (90-138)/(48-78) 129/68 mmHg (05/06 0500) SpO2:  [95 %-100 %] 97 % (05/06 0500) Weight:  [60.7 kg (133 lb 13.1 oz)-62.6 kg (138 lb 0.1 oz)] 62.6 kg (138 lb 0.1 oz) (05/06 0300)  Intake/Output from previous day: 05/05 0701 - 05/06 0700 In: -  Out: 1200 [Urine:1200] Intake/Output this shift:     Basename 11/26/11 0525 11/25/11 0812  HGB 11.1* 13.3    Basename 11/26/11 0525 11/25/11 0812  WBC 14.6* 19.6*  RBC 3.48* 4.21  HCT 33.3* 39.6  PLT 117* 167    Basename 11/26/11 0525 11/25/11 0812  NA 139 142  K 3.9 3.8  CL 102 102  CO2 30 28  BUN 29* 22  CREATININE 1.23* 0.97  GLUCOSE 123* 142*  CALCIUM 9.1 10.1    Basename 11/25/11 0812  LABPT --  INR 1.04    Neurologically intact Neurovascular intact Sensation intact distally Dorsiflexion/Plantar flexion intact Compartment soft  Assessment/Plan:   Procedure(s) (LRB): ARTHROPLASTY BIPOLAR HIP (Left) Plan to OR today at 1200 Discussed procedure with patient and daughter aware of risks and benefits Family requests inpatient rehab post op Will start gentle hydration since patient is NPO and a slight bump in renal function is noted  Cosima Prentiss R. 11/26/2011, 9:31 AM

## 2011-11-26 NOTE — Progress Notes (Signed)
Utilization review completed. Larissa Pegg RN, CCM  

## 2011-11-26 NOTE — Progress Notes (Signed)
Pt BP 123/37. Pt c/o dizziness. Rechecked manually and BP 123/42. Lung sounds clear. MD notified. MD d/c'ed amlodipine and ordered 250cc bolus if BP remained low after 30 minutes. Pt BP recheck 125/40. 250cc bolus given. BP rechecked manually 120/50. Pt still c/o dizziness. Lung sounds clear. Will continue to monitor.

## 2011-11-26 NOTE — Anesthesia Postprocedure Evaluation (Signed)
Anesthesia Post Note  Patient: [name]  Procedure(s) Performed: Procedure(s) (LRB): ARTHROPLASTY BIPOLAR HIP (Left)  Anesthesia type: General  Patient location: PACU  Post pain: Pain level controlled and Adequate analgesia  Post assessment: Post-op Vital signs reviewed, Patient's Cardiovascular Status Stable, Respiratory Function Stable, Patent Airway and Pain level controlled  Last Vitals:  Filed Vitals:   11/26/11 1500  BP: 127/42  Pulse: 88  Temp:   Resp: 19    Post vital signs: Reviewed and stable  Level of consciousness: awake, alert  and oriented  Complications: No apparent anesthesia complications

## 2011-11-26 NOTE — ED Provider Notes (Signed)
Medical screening examination/treatment/procedure(s) were conducted as a shared visit with non-physician practitioner(s) and myself.  I personally evaluated the patient during the encounter.  The patient is a 78 year old female who presents after a fall.  She is complaining of left hip pain.  On exam, vitals are stable and she is afebrile.  The left lower extremity is shortened and externally rotated.  The heart, lung, and abdominal exams are unremarkable.  Xrays reveal a femoral neck fracture.  Dr. Jillyn Hidden was consulted who wants patient admitted to [facility] with ortho consulting.  Patient will be admitted to [facility].  Geoffery Lyons, MD 11/26/11 [phone]

## 2011-11-26 NOTE — Preoperative (Signed)
Beta Blockers   Reason not to administer Beta Blockers:Not Applicable 

## 2011-11-26 NOTE — Progress Notes (Signed)
Pt arrived back to unit from OR after hip surgery. Pt placed back on tele. VS taken 125/63, T 99.5, 96% on 2L, and HR 89. Will continue to monitor

## 2011-11-26 NOTE — Anesthesia Preprocedure Evaluation (Addendum)
Anesthesia Evaluation  Patient identified by MRN, date of birth, ID band Patient awake    Reviewed: Allergy & Precautions, H&P , NPO status , Patient's Chart, lab work & pertinent test results  Airway Mallampati: I TM Distance: >3 FB Neck ROM: Full    Dental  (+) Edentulous Upper, Edentulous Lower and Dental Advidsory Given   Pulmonary          Cardiovascular hypertension, Pt. on medications + CAD     Neuro/Psych    GI/Hepatic PUD, GERD-  Controlled and Medicated,  Endo/Other    Renal/GU      Musculoskeletal   Abdominal   Peds  Hematology   Anesthesia Other Findings   Reproductive/Obstetrics                          Anesthesia Physical Anesthesia Plan  ASA: III  Anesthesia Plan: General   Post-op Pain Management:    Induction: Intravenous  Airway Management Planned: Oral ETT  Additional Equipment:   Intra-op Plan:   Post-operative Plan: Extubation in OR  Informed Consent: I have reviewed the patients History and Physical, chart, labs and discussed the procedure including the risks, benefits and alternatives for the proposed anesthesia with the patient or authorized representative who has indicated his/her understanding and acceptance.   Dental Advisory Given  Plan Discussed with: CRNA, Surgeon and Anesthesiologist  Anesthesia Plan Comments:        Anesthesia Quick Evaluation

## 2011-11-26 NOTE — Transfer of Care (Signed)
Immediate Anesthesia Transfer of Care Note  Patient: [name]  Procedure(s) Performed: Procedure(s) (LRB): ARTHROPLASTY BIPOLAR HIP (Left)  Patient Location: PACU  Anesthesia Type: General  Level of Consciousness: awake, alert  and oriented  Airway & Oxygen Therapy: Patient Spontanous Breathing and Patient connected to face mask oxygen  Post-op Assessment: Report given to PACU RN, Post -op Vital signs reviewed and stable, Patient moving all extremities and Patient moving all extremities X 4  Post vital signs: Reviewed and stable  Complications: No apparent anesthesia complications

## 2011-11-26 NOTE — Brief Op Note (Signed)
11/25/2011 - 11/26/2011  2:22 PM  PATIENT:  [name]  78 y.o. female  PRE-OPERATIVE DIAGNOSIS:  lt hip fx  POST-OPERATIVE DIAGNOSIS:  left hip fracture  PROCEDURE:  Procedure(s) (LRB): ARTHROPLASTY BIPOLAR HIP (Left)  SURGEON:  Surgeon(s) and Role:    * Javier Docker, MD - Primary  PHYSICIAN ASSISTANT:   ASSISTANTS: strader   ANESTHESIA:   general  EBL:  Total I/O In: 1100 [I.V.:1100] Out: 550 [Urine:350; Blood:200]  BLOOD ADMINISTERED:none  DRAINS: none   LOCAL MEDICATIONS USED:  NONE  SPECIMEN:  No Specimen  DISPOSITION OF SPECIMEN:  N/A  COUNTS:  YES  TOURNIQUET:  * No tourniquets in log *  DICTATION: .Other Dictation: Dictation Number [id number]  PLAN OF CARE: Admit for overnight observation  PATIENT DISPOSITION:  PACU - hemodynamically stable.   Delay start of Pharmacological VTE agent (>24hrs) due to surgical blood loss or risk of bleeding: no

## 2011-11-27 ENCOUNTER — Inpatient Hospital Stay (HOSPITAL_COMMUNITY): Payer: Medicare Other

## 2011-11-27 ENCOUNTER — Encounter (HOSPITAL_COMMUNITY): Payer: Self-pay | Admitting: Specialist

## 2011-11-27 DIAGNOSIS — W19XXXA Unspecified fall, initial encounter: Secondary | ICD-10-CM

## 2011-11-27 DIAGNOSIS — S72009A Fracture of unspecified part of neck of unspecified femur, initial encounter for closed fracture: Secondary | ICD-10-CM

## 2011-11-27 DIAGNOSIS — R509 Fever, unspecified: Secondary | ICD-10-CM

## 2011-11-27 DIAGNOSIS — W108XXA Fall (on) (from) other stairs and steps, initial encounter: Secondary | ICD-10-CM

## 2011-11-27 LAB — CBC
HCT: 25.5 % — ABNORMAL LOW (ref 36.0–46.0)
Hemoglobin: 8.6 g/dL — ABNORMAL LOW (ref 12.0–15.0)
MCH: 31.9 pg (ref 26.0–34.0)
MCHC: 33.7 g/dL (ref 30.0–36.0)
MCV: 94.4 fL (ref 78.0–100.0)
RDW: 15.1 % (ref 11.5–15.5)

## 2011-11-27 LAB — BASIC METABOLIC PANEL
BUN: 18 mg/dL (ref 6–23)
Creatinine, Ser: 0.9 mg/dL (ref 0.50–1.10)
GFR calc Af Amer: 63 mL/min — ABNORMAL LOW (ref >90)
GFR calc non Af Amer: 54 mL/min — ABNORMAL LOW (ref >90)
Glucose, Bld: 122 mg/dL — ABNORMAL HIGH (ref 70–99)
Potassium: 4.2 mEq/L (ref 3.5–5.1)

## 2011-11-27 LAB — DIFFERENTIAL
Basophils Absolute: 0 10*3/uL (ref 0.0–0.1)
Eosinophils Relative: 10 % — ABNORMAL HIGH (ref 0–5)
Lymphocytes Relative: 10 % — ABNORMAL LOW (ref 12–46)
Neutrophils Relative %: 68 % (ref 43–77)

## 2011-11-27 LAB — PROCALCITONIN: Procalcitonin: 1.28 ng/mL

## 2011-11-27 LAB — URINE MICROSCOPIC-ADD ON

## 2011-11-27 LAB — URINALYSIS, ROUTINE W REFLEX MICROSCOPIC
Ketones, ur: NEGATIVE mg/dL
Leukocytes, UA: NEGATIVE
Nitrite: NEGATIVE
Specific Gravity, Urine: 1.012 (ref 1.005–1.030)
pH: 6 (ref 5.0–8.0)

## 2011-11-27 LAB — URINE CULTURE: Colony Count: >=100000

## 2011-11-27 MED ORDER — ENOXAPARIN SODIUM 30 MG/0.3ML ~~LOC~~ SOLN
30.0000 mg | SUBCUTANEOUS | Status: DC
  Administered 2011-11-27 – 2011-12-04 (×8): 30 mg via SUBCUTANEOUS
  Filled 2011-11-27 (×8): qty 0.3

## 2011-11-27 MED ORDER — ALPRAZOLAM 0.25 MG PO TABS
0.2500 mg | ORAL_TABLET | Freq: Three times a day (TID) | ORAL | Status: DC | PRN
  Administered 2011-11-27 – 2011-12-04 (×7): 0.25 mg via ORAL
  Filled 2011-11-27 (×7): qty 1

## 2011-11-27 NOTE — Progress Notes (Signed)
I met with patient and then contacted her daughter by phone. We feel patient would benefit from an inpt rehab stay prior to d/c home. I will begin authorization with [employer]. We can admit pending insurance approval. [phone] with questions.

## 2011-11-27 NOTE — Evaluation (Signed)
Physical Therapy Evaluation Patient Details Name: [name] MRN: [id number] DOB: 26-Aug-1917 Today's Date: 11/27/2011 Time: 4098-1191 PT Time Calculation (min): 35 min  PT Assessment / Plan / Recommendation Clinical Impression  Pt s/p left hip hemiarthroplasty.  Mobility presently limited by pain weakness, lightheadedness and decreased activity tolerance.  Recommend CIR    PT Assessment  Patient needs continued PT services    Follow Up Recommendations  Inpatient Rehab    Equipment Recommendations  Defer to next venue    Frequency Min 3X/week    Precautions / Restrictions Precautions Precautions: Posterior Hip;Fall Required Braces or Orthoses: Knee Immobilizer - Left Knee Immobilizer - Left: Other (comment) (needs it on in bed when sleeping) Restrictions Weight Bearing Restrictions: Yes LLE Weight Bearing: Partial weight bearing LLE Partial Weight Bearing Percentage or Pounds: 50   Pertinent Vitals/Pain 4/10 hip pain on left       Mobility  Bed Mobility Bed Mobility: Supine to Sit;Sitting - Scoot to Edge of Bed Supine to Sit: 1: +2 Total assist;HOB elevated;Other (comment) (25 degrees) Supine to Sit: Patient Percentage: 10% Sitting - Scoot to Edge of Bed: 2: Max assist Details for Bed Mobility Assistance: vc's for hand placement; truncal assist to come forward Transfers Transfers: Sit to Stand;Stand to Sit;Stand Pivot Transfers Sit to Stand: 1: +2 Total assist;With upper extremity assist;From bed Sit to Stand: Patient Percentage: 30% Stand to Sit: 1: +2 Total assist;With upper extremity assist;To chair/3-in-1 Stand to Sit: Patient Percentage: 30% Stand Pivot Transfers: 1: +2 Total assist Stand Pivot Transfers: Patient Percentage: 50% Details for Transfer Assistance: vc's for hand placement;assist for maneuvering RW, helping w/shift and maintain PWB Ambulation/Gait Ambulation/Gait Assistance: Not tested (comment) Stairs: No    Exercises     PT Goals Acute  Rehab PT Goals PT Goal Formulation: With patient Time For Goal Achievement: 12/04/11 Potential to Achieve Goals: Good Pt will go Supine/Side to Sit: with mod assist PT Goal: Supine/Side to Sit - Progress: Goal set today Pt will go Sit to Stand: with mod assist PT Goal: Sit to Stand - Progress: Goal set today Pt will Transfer Bed to Chair/Chair to Bed: with mod assist PT Transfer Goal: Bed to Chair/Chair to Bed - Progress: Goal set today Pt will Ambulate: 16 - 50 feet;with min assist;with least restrictive assistive device PT Goal: Ambulate - Progress: Goal set today Pt will Perform Home Exercise Program: with supervision, verbal cues required/provided PT Goal: Perform Home Exercise Program - Progress: Goal set today  Visit Information  Last PT Received On: 11/27/11 Assistance Needed: +2    Subjective Data  Subjective: i usually don't use a RW, sometimes the cane Patient Stated Goal: Home, Independent and able to be alone   Prior Functioning  Home Living Lives With: Alone Available Help at Discharge: Family Type of Home: Mobile home Home Access: Ramped entrance;Stairs to enter Entrance Stairs-Number of Steps: 4 Entrance Stairs-Rails: Right;Left Home Layout: One level;Able to live on main level with bedroom/bathroom Bathroom Shower/Tub: Health visitor: Standard Home Adaptive Equipment: Shower chair without back;Straight cane;Walker - rolling Prior Function Level of Independence: Independent with assistive device(s) Able to Take Stairs?: Yes Driving: No Communication Communication: No difficulties    Cognition  Overall Cognitive Status: Appears within functional limits for tasks assessed/performed Arousal/Alertness: Awake/alert Orientation Level: Oriented X4 / Intact Behavior During Session: [facility] for tasks performed    Extremity/Trunk Assessment Right Lower Extremity Assessment RLE ROM/Strength/Tone: Within functional levels Left Lower Extremity  Assessment LLE ROM/Strength/Tone: Unable to fully assess;Due  to pain Trunk Assessment Trunk Assessment: Normal   Balance Balance Balance Assessed: Yes Static Sitting Balance Static Sitting - Balance Support: Bilateral upper extremity supported;Feet supported Static Sitting - Level of Assistance: 3: Mod assist Static Sitting - Comment/# of Minutes: 10  End of Session PT - End of Session Activity Tolerance: Patient tolerated treatment well;Other (comment) (limited by lightheadedness) Patient left: in chair;with call bell/phone within reach;with family/visitor present Nurse Communication: Mobility status;Weight bearing status;Precautions   Maryem Shuffler, Eliseo Gum 11/27/2011, 10:33 AM  11/27/2011  [place] Bing, PT [phone] (pager)

## 2011-11-27 NOTE — Consult Note (Signed)
Physical Medicine and Rehabilitation Consult Reason for Consult: Left femoral neck fracture Referring Phsyician: [facility] [name] is an 78 y.o. female.   HPI: 78 year old right-handed female with history of coronary artery disease, CVA. Admitted May 5 after mechanical fall without loss of consciousness when she tripped on some steps landing on her left side. X-rays revealed left femoral neck fracture. Underwent left hip hemiarthroplasty May 7 per Dr. Shelle Iron. Placed on Lovenox for DVT prophylaxis. Posterior hip precautions and partial weight-bearing. Postoperative anemia 8.6 and monitored. Physical therapy evaluation completed today May 7 recommendations for physical medicine rehabilitation consult to consider inpatient rehabilitation services.  Review of Systems  Gastrointestinal: Positive for heartburn and constipation.  Musculoskeletal: Positive for myalgias and joint pain.  All other systems reviewed and are negative.   Past Medical History  Diagnosis Date  . CAD (coronary artery disease)   . GERD (gastroesophageal reflux disease)   . Hypertension   . Diverticulosis   . Hyperlipidemia   . Gastric peptic ulcer   . Allergy   . Anxiety   . Arthritis   . Blood transfusion     when had my hip replaced  . Cataract     removed bil eyes  . Depression   . Osteoporosis   . Stroke     TIA  . Irregular heart beat    Past Surgical History  Procedure Date  . Stomach ulcer   . Replacement total hip w/  resurfacing implants     right hip  . Coronary stent placement   . Kidney stent   . Tonsillectomy   . Bil cataracts removed   . Colonoscopy   . Joint replacement    Family History  Problem Relation Age of Onset  . Uterine cancer Sister   . Heart disease Sister     and brother  . Diabetes Son   . Esophageal cancer Neg Hx   . Colon cancer Neg Hx   . Stomach cancer Neg Hx   . Hypertension Mother   . Hypertension Father    Social History:  reports that she has never smoked.  She has never used smokeless tobacco. She reports that she does not drink alcohol or use illicit drugs. Allergies: No Known Allergies Medications Prior to Admission  Medication Sig Dispense Refill  . acetaminophen (TYLENOL) 325 MG tablet Take 650 mg by mouth 2 (two) times daily.      Marland Kitchen ALPRAZolam (XANAX) 0.25 MG tablet Take 0.25 mg by mouth at bedtime as needed. For sleep      . calcium gluconate 500 MG tablet Take 500 mg by mouth daily.        Marland Kitchen docusate sodium (COLACE) 100 MG capsule Take 100 mg by mouth 2 (two) times daily.        Marland Kitchen estradiol (ESTRACE) 0.5 MG tablet Take 0.5 mg by mouth daily.        . fish oil-omega-3 fatty acids 1000 MG capsule Take 2 g by mouth daily.        . folic acid (FOLVITE) 1 MG tablet Take 1 mg by mouth daily.        Marland Kitchen losartan (COZAAR) 50 MG tablet Take 50 mg by mouth daily.        . mirtazapine (REMERON) 15 MG tablet Take 15 mg by mouth at bedtime.        . Multiple Vitamin (MULTIVITAMIN) tablet Take 1 tablet by mouth daily.        . pantoprazole (PROTONIX) 40  MG tablet Take 40 mg by mouth 2 (two) times daily.        . simethicone (MYLICON) 125 MG chewable tablet Chew 125 mg by mouth every 6 (six) hours as needed. For gas relieve      . temazepam (RESTORIL) 15 MG capsule Take 15 mg by mouth at bedtime as needed. For sleep      . triamterene-hydrochlorothiazide (DYAZIDE) 37.5-25 MG per capsule Take 1 capsule by mouth daily as needed. For blood pressure        Home: Home Living Lives With: Alone Available Help at Discharge: Family Type of Home: Mobile home Home Access: Ramped entrance;Stairs to enter Entrance Stairs-Number of Steps: 4 Entrance Stairs-Rails: Right;Left Home Layout: One level;Able to live on main level with bedroom/bathroom Bathroom Shower/Tub: Health visitor: Standard Home Adaptive Equipment: Shower chair without back;Straight cane;Walker - rolling  Functional History: Prior Function Able to Take Stairs?: Yes Driving:  No Functional Status:  Mobility: Bed Mobility Bed Mobility: Supine to Sit;Sitting - Scoot to Edge of Bed Supine to Sit: 1: +2 Total assist;HOB elevated;Other (comment) (25 degrees) Supine to Sit: Patient Percentage: 10% Sitting - Scoot to Edge of Bed: 2: Max assist Transfers Transfers: Sit to Stand;Stand to [employer] Transfers Sit to Stand: 1: +2 Total assist;With upper extremity assist;From bed Sit to Stand: Patient Percentage: 30% Stand to Sit: 1: +2 Total assist;With upper extremity assist;To chair/3-in-1 Stand to Sit: Patient Percentage: 30% Stand Pivot Transfers: 1: +2 Total assist Stand Pivot Transfers: Patient Percentage: 50% Ambulation/Gait Ambulation/Gait Assistance: Not tested (comment) Stairs: No    ADL: ADL Grooming: Simulated;Brushing hair;Set up Where Assessed - Grooming: Supported sitting Upper Body Bathing: Simulated;Minimal assistance Where Assessed - Upper Body Bathing: Sitting, chair Lower Body Bathing: Simulated;Maximal assistance Where Assessed - Lower Body Bathing: Sit to stand from bed Upper Body Dressing: Simulated;Minimal assistance Where Assessed - Upper Body Dressing: Sitting, chair;Supported Lower Body Dressing: Simulated;Maximal assistance Where Assessed - Lower Body Dressing: Sit to stand from bed Toilet Transfer: +2 Total assistance Toilet Transfer Method: Ambulating;Other (comment) (with walker. approx 4 steps) Toileting - Clothing Manipulation: Simulated;Maximal assistance Where Assessed - Glass blower/designer Manipulation: Standing Toileting - Hygiene: Simulated;Maximal assistance Where Assessed - Toileting Hygiene: Standing Tub/Shower Transfer: Not assessed  Cognition: Cognition Arousal/Alertness: Awake/alert Orientation Level: Oriented X4 Cognition Overall Cognitive Status: Appears within functional limits for tasks assessed/performed Arousal/Alertness: Awake/alert Orientation Level: Oriented X4 / Intact Behavior During Session:  [facility] for tasks performed  Blood pressure 109/43, pulse 80, temperature 99 F (37.2 C), temperature source Oral, resp. rate 18, height 5\' 3"  (1.6 m), weight 62 kg (136 lb 11 oz), SpO2 100.00%. Physical Exam  Vitals reviewed. Constitutional: She is oriented to person, place, and time.  HENT:  Head: Normocephalic.  Neck: Neck supple. No thyromegaly present.  Cardiovascular: Regular rhythm.   Pulmonary/Chest: Breath sounds normal. She has no wheezes.  Abdominal: She exhibits no distension. There is no tenderness.  Musculoskeletal: She exhibits no edema.       No limitations in ROM other than the left hip  Neurological: She is alert and oriented to person, place, and time. She has normal strength. No cranial nerve deficit or sensory deficit.       No focal motor loss. Moves UE at 4/5  RLE is 4/5 and LLE is 1/5 prox to 3 distally  Skin: Skin is warm.       Hip incision clean with appropriate drainage. Area appropriately tender.  Psychiatric: She has a normal mood  and affect. Her behavior is normal. Judgment and thought content normal.    Results for orders placed during the hospital encounter of 11/25/11 (from the past 24 hour(s))  HEMOGLOBIN AND HEMATOCRIT, BLOOD     Status: Abnormal   Collection Time   11/26/11  4:56 PM      Component Value Range   Hemoglobin 9.8 (*) 12.0 - 15.0 (g/dL)   HCT 16.1 (*) 09.6 - 46.0 (%)  BASIC METABOLIC PANEL     Status: Abnormal   Collection Time   11/26/11  9:39 PM      Component Value Range   Sodium 136  135 - 145 (mEq/L)   Potassium 4.0  3.5 - 5.1 (mEq/L)   Chloride 103  96 - 112 (mEq/L)   CO2 27  19 - 32 (mEq/L)   Glucose, Bld 144 (*) 70 - 99 (mg/dL)   BUN 23  6 - 23 (mg/dL)   Creatinine, Ser 0.45  0.50 - 1.10 (mg/dL)   Calcium 8.4  8.4 - 40.9 (mg/dL)   GFR calc non Af Amer 48 (*) >90 (mL/min)   GFR calc Af Amer 56 (*) >90 (mL/min)  CBC     Status: Abnormal   Collection Time   11/26/11  9:39 PM      Component Value Range   WBC 8.9  4.0 - 10.5  (K/uL)   RBC 2.78 (*) 3.87 - 5.11 (MIL/uL)   Hemoglobin 8.8 (*) 12.0 - 15.0 (g/dL)   HCT 81.1 (*) 91.4 - 46.0 (%)   MCV 95.0  78.0 - 100.0 (fL)   MCH 31.7  26.0 - 34.0 (pg)   MCHC 33.3  30.0 - 36.0 (g/dL)   RDW 78.2  95.6 - 21.3 (%)   Platelets 90 (*) 150 - 400 (K/uL)  CBC     Status: Abnormal   Collection Time   11/27/11  6:05 AM      Component Value Range   WBC 8.8  4.0 - 10.5 (K/uL)   RBC 2.70 (*) 3.87 - 5.11 (MIL/uL)   Hemoglobin 8.6 (*) 12.0 - 15.0 (g/dL)   HCT 08.6 (*) 57.8 - 46.0 (%)   MCV 94.4  78.0 - 100.0 (fL)   MCH 31.9  26.0 - 34.0 (pg)   MCHC 33.7  30.0 - 36.0 (g/dL)   RDW 46.9  62.9 - 52.8 (%)   Platelets 94 (*) 150 - 400 (K/uL)  BASIC METABOLIC PANEL     Status: Abnormal   Collection Time   11/27/11  6:05 AM      Component Value Range   Sodium 136  135 - 145 (mEq/L)   Potassium 4.2  3.5 - 5.1 (mEq/L)   Chloride 104  96 - 112 (mEq/L)   CO2 26  19 - 32 (mEq/L)   Glucose, Bld 122 (*) 70 - 99 (mg/dL)   BUN 18  6 - 23 (mg/dL)   Creatinine, Ser 4.13  0.50 - 1.10 (mg/dL)   Calcium 8.5  8.4 - 24.4 (mg/dL)   GFR calc non Af Amer 54 (*) >90 (mL/min)   GFR calc Af Amer 63 (*) >90 (mL/min)  DIFFERENTIAL     Status: Abnormal   Collection Time   11/27/11  6:05 AM      Component Value Range   Neutrophils Relative 68  43 - 77 (%)   Neutro Abs 6.0  1.7 - 7.7 (K/uL)   Lymphocytes Relative 10 (*) 12 - 46 (%)   Lymphs Abs 0.9  0.7 -  4.0 (K/uL)   Monocytes Relative 13 (*) 3 - 12 (%)   Monocytes Absolute 1.1 (*) 0.1 - 1.0 (K/uL)   Eosinophils Relative 10 (*) 0 - 5 (%)   Eosinophils Absolute 0.8 (*) 0.0 - 0.7 (K/uL)   Basophils Relative 0  0 - 1 (%)   Basophils Absolute 0.0  0.0 - 0.1 (K/uL)  PROCALCITONIN     Status: Normal   Collection Time   11/27/11  6:05 AM      Component Value Range   Procalcitonin 1.28    URINALYSIS, ROUTINE W REFLEX MICROSCOPIC     Status: Abnormal   Collection Time   11/27/11  6:35 AM      Component Value Range   Color, Urine YELLOW  YELLOW     APPearance CLEAR  CLEAR    Specific Gravity, Urine 1.012  1.005 - 1.030    pH 6.0  5.0 - 8.0    Glucose, UA NEGATIVE  NEGATIVE (mg/dL)   Hgb urine dipstick TRACE (*) NEGATIVE    Bilirubin Urine NEGATIVE  NEGATIVE    Ketones, ur NEGATIVE  NEGATIVE (mg/dL)   Protein, ur NEGATIVE  NEGATIVE (mg/dL)   Urobilinogen, UA 0.2  0.0 - 1.0 (mg/dL)   Nitrite NEGATIVE  NEGATIVE    Leukocytes, UA NEGATIVE  NEGATIVE   URINE MICROSCOPIC-ADD ON     Status: Normal   Collection Time   11/27/11  6:35 AM      Component Value Range   Squamous Epithelial / LPF RARE  RARE    WBC, UA 0-2  <3 (WBC/hpf)   RBC / HPF 0-2  <3 (RBC/hpf)   Urine-Other MUCOUS PRESENT     Dg Chest 2 View  11/26/2011  *RADIOLOGY REPORT*  Clinical Data: Shortness of breath and weakness.  Hip surgery.  CHEST - 2 VIEW  Comparison: 11/25/2011 and 03/11/2009.  Findings: Trachea is midline.  Heart size stable.  Mild interstitial prominence and indistinctness.  Small bilateral pleural effusions.  Osteopenia with thoracolumbar compression fractures, as before. Degenerative changes in the shoulders.  IMPRESSION: Mild edema with small bilateral pleural effusions.  Original Report Authenticated By: Reyes Ivan, M.D.   Dg Hip Portable 1 View Left  11/26/2011  *RADIOLOGY REPORT*  Clinical Data: Left hip replacement.  Postoperative radiograph.  PORTABLE LEFT HIP - 1 VIEW  Comparison: None.  Findings: Single frontal view of the left hip demonstrates a new left hip bipolar hemiarthroplasty.  Expected postoperative changes in the soft tissues.  No periprosthetic fracture is identified.  IMPRESSION: New left bipolar hip hemiarthroplasty.  Original Report Authenticated By: Andreas Newport, M.D.    Assessment/Plan: Diagnosis: left FNF s/p hip hemi 5/7 1. Does the need for close, 24 hr/day medical supervision in concert with the patient's rehab needs make it unreasonable for this patient to be served in a less intensive setting? Yes 2. Co-Morbidities  requiring supervision/potential complications: htn, CAD, remote CVA, PVD 3. Due to bladder management, bowel management, safety, skin/wound care, disease management, medication administration, pain management and patient education, does the patient require 24 hr/day rehab nursing? Yes 4. Does the patient require coordinated care of a physician, rehab nurse, PT (1-2 hrs/day, 5 days/week) and OT (1-2 hrs/day, 5 days/week) to address physical and functional deficits in the context of the above medical diagnosis(es)? Yes Addressing deficits in the following areas: balance, endurance, locomotion, strength, transferring, bowel/bladder control, bathing, dressing, feeding, grooming, toileting and psychosocial support 5. Can the patient actively participate in an intensive therapy program  of at least 3 hrs of therapy per day at least 5 days per week? Yes 6. The potential for patient to make measurable gains while on inpatient rehab is excellent 7. Anticipated functional outcomes upon discharge from inpatient rehab are mod I to supervision with PT, mod I to supervision with OT 8. Estimated rehab length of stay to reach the above functional goals is: 2 weeks 9. Does the patient have adequate social supports to accommodate these discharge functional goals? Yes 10. Anticipated D/C setting: Home 11. Anticipated post D/C treatments: HH therapy 12. Overall Rehab/Functional Prognosis: excellent  RECOMMENDATIONS: This patient's condition is appropriate for continued rehabilitative care in the following setting: CIR Patient has agreed to participate in recommended program. Yes Note that insurance prior authorization may be required for reimbursement for recommended care.  Comment: Rehab RN to follow up.   Ivory Broad, MD 11/27/2011

## 2011-11-27 NOTE — Progress Notes (Signed)
Nursing Note: Pt's blood pressure 121/82 and heart rate of 78. Pt stable on observation. Family at bedside. Will continue to monitor pt. Latishia Suitt Scientist, clinical (histocompatibility and immunogenetics).

## 2011-11-27 NOTE — Progress Notes (Signed)
Utilization review completed. Esabella Stockinger RN, CCM  

## 2011-11-27 NOTE — Progress Notes (Signed)
Subjective: 1 Day Post-Op Procedure(s) (LRB): ARTHROPLASTY BIPOLAR HIP (Left) Patient reports pain as mild.   Denies CP or SOB.  She states she has been using the IS.  She denies any nausea or dizziness at this time.  Objective: Vital signs in last 24 hours: Temp:  [98.7 F (37.1 C)-101.3 F (38.5 C)] 101.3 F (38.5 C) (05/07 0604) Pulse Rate:  [65-99] 91  (05/07 0604) Resp:  [15-24] 16  (05/07 0604) BP: (96-149)/(31-87) 127/49 mmHg (05/07 0604) SpO2:  [94 %-100 %] 94 % (05/07 0604) Weight:  [62 kg (136 lb 11 oz)] 62 kg (136 lb 11 oz) (05/07 0604)  Intake/Output from previous day: 05/06 0701 - 05/07 0700 In: 1528.8 [I.V.:1278.8; IV Piggyback:250] Out: 1650 [Urine:1450; Blood:200] Intake/Output this shift:     Basename 11/27/11 0605 11/26/11 2139 11/26/11 1656 11/26/11 0525 11/25/11 0812  HGB 8.6* 8.8* 9.8* 11.1* 13.3    Basename 11/27/11 0605 11/26/11 2139  WBC 8.8 8.9  RBC 2.70* 2.78*  HCT 25.5* 26.4*  PLT 94* 90*    Basename 11/27/11 0605 11/26/11 2139  NA 136 136  K 4.2 4.0  CL 104 103  CO2 26 27  BUN 18 23  CREATININE 0.90 0.99  GLUCOSE 122* 144*  CALCIUM 8.5 8.4    Basename 11/25/11 0812  LABPT --  INR 1.04    Neurologically intact Neurovascular intact Sensation intact distally Dorsiflexion/Plantar flexion intact Incision: dressing C/D/I Compartment soft  Assessment/Plan: 1 Day Post-Op Procedure(s) (LRB): ARTHROPLASTY BIPOLAR HIP (Left) Advance diet Up with therapy D/c planning for SNF pt requests rehab but not sure will qualify Dressing change tomorrow Lovenox low dose for DVT prophylaxis   Lanique Gonzalo R. 11/27/2011, 7:46 AM

## 2011-11-27 NOTE — Progress Notes (Signed)
Nursing Note: Pt complaining of feeling anxious. MD made aware Xanax given per MD 's order. Will continue to monitor pt. Koen Antilla Scientist, clinical (histocompatibility and immunogenetics).

## 2011-11-27 NOTE — Progress Notes (Signed)
Clinical Social Work-Covering CSW received referral, attempted to contact pt dtr. Per MD and all notes pt and family preference is CIR-CSW confirmed referral with Ottie Glazier admissions coordinator who will be meeting with pt today-CSW has initiated FL2 in the event that CIR not an option and has provided report for unit based SW who will continue to follow- Jodean Lima, [phone]

## 2011-11-27 NOTE — Progress Notes (Signed)
Occupational Therapy Evaluation Patient Details Name: [name] MRN: [id number] DOB: [birth year] Today's Date: 11/27/2011 Time: 4098-1191 OT Time Calculation (min): 30 min  OT Assessment / Plan / Recommendation Clinical Impression  Pt presents to OT s/p fall with L hip fracture. Pt with overall decreased I with ADL activity due to decreased strength and PWB status. Will benefit from continued OT to increase I with ADL activity and return to [facility]    OT Assessment  Patient needs continued OT Services    Follow Up Recommendations  Inpatient Rehab    Equipment Recommendations  Defer to next venue    Frequency Min 2X/week    Precautions / Restrictions Precautions Precautions: Posterior Hip;Fall Required Braces or Orthoses: Knee Immobilizer - Left Knee Immobilizer - Left: Other (comment) (needs it on in bed when sleeping) Restrictions Weight Bearing Restrictions: Yes LLE Weight Bearing: Partial weight bearing LLE Partial Weight Bearing Percentage or Pounds: 50       ADL  Grooming: Simulated;Brushing hair;Set up Where Assessed - Grooming: Supported sitting Upper Body Bathing: Simulated;Minimal assistance Where Assessed - Upper Body Bathing: Sitting, chair Lower Body Bathing: Simulated;Maximal assistance Where Assessed - Lower Body Bathing: Sit to stand from bed Upper Body Dressing: Simulated;Minimal assistance Where Assessed - Upper Body Dressing: Sitting, chair;Supported Lower Body Dressing: Simulated;Maximal assistance Where Assessed - Lower Body Dressing: Sit to stand from bed Toilet Transfer: +2 Total assistance Toilet Transfer Method: Ambulating;Other (comment) (with walker. approx 4 steps) Toileting - Clothing Manipulation: Simulated;Maximal assistance Where Assessed - Glass blower/designer Manipulation: Standing Toileting - Hygiene: Simulated;Maximal assistance Where Assessed - Toileting Hygiene: Standing Tub/Shower Transfer: Not assessed    OT Goals Acute Rehab OT  Goals OT Goal Formulation: With patient ADL Goals Pt Will Perform Grooming: with supervision;Unsupported;Sitting, edge of bed ADL Goal: Grooming - Progress: Goal set today Pt Will Transfer to Toilet: with mod assist;3-in-1;Maintaining weight bearing status ADL Goal: Toilet Transfer - Progress: Goal set today Pt Will Perform Toileting - Clothing Manipulation: Sitting on 3-in-1 or toilet;with mod assist;Standing ADL Goal: Toileting - Clothing Manipulation - Progress: Goal set today  Visit Information  Assistance Needed: +2 PT/OT Co-Evaluation/Treatment: Yes       Prior Functioning  Home Living Lives With: Alone Available Help at Discharge: Family Type of Home: Mobile home Home Access: Ramped entrance;Stairs to enter Entrance Stairs-Number of Steps: 4 Entrance Stairs-Rails: Right;Left Home Layout: One level;Able to live on main level with bedroom/bathroom Bathroom Shower/Tub: Health visitor: Standard Home Adaptive Equipment: Shower chair without back;Straight cane;Walker - rolling Prior Function Level of Independence: Independent with assistive device(s) Able to Take Stairs?: Yes Driving: No Communication Communication: No difficulties    Cognition  Overall Cognitive Status: Appears within functional limits for tasks assessed/performed Arousal/Alertness: Awake/alert Orientation Level: Oriented X4 / Intact Behavior During Session: [facility] for tasks performed    Extremity/Trunk Assessment Right Upper Extremity Assessment RUE ROM/Strength/Tone: [facility] for tasks assessed Left Upper Extremity Assessment LUE ROM/Strength/Tone: WFL for tasks assessed Right Lower Extremity Assessment RLE ROM/Strength/Tone: Within functional levels Left Lower Extremity Assessment LLE ROM/Strength/Tone: Unable to fully assess;Due to pain Trunk Assessment Trunk Assessment: Normal   Mobility Bed Mobility Bed Mobility: Supine to Sit;Sitting - Scoot to Edge of Bed Supine to Sit: 1: +2  Total assist;HOB elevated;Other (comment) (25 degrees) Supine to Sit: Patient Percentage: 10% Sitting - Scoot to Edge of Bed: 2: Max assist Details for Bed Mobility Assistance: vc's for hand placement; truncal assist to come forward Transfers Sit to Stand: 1: +2 Total assist;With  upper extremity assist;From bed Sit to Stand: Patient Percentage: 30% Stand to Sit: 1: +2 Total assist;With upper extremity assist;To chair/3-in-1 Stand to Sit: Patient Percentage: 30% Details for Transfer Assistance: vc's for hand placement;assist for maneuvering RW, helping w/shift and maintain PWB      Balance Balance Balance Assessed: Yes Static Sitting Balance Static Sitting - Balance Support: Bilateral upper extremity supported;Feet supported Static Sitting - Level of Assistance: 3: Mod assist Static Sitting - Comment/# of Minutes: 10  End of Session OT - End of Session Activity Tolerance: Patient tolerated treatment well Patient left: in chair;with call bell/phone within reach;with family/visitor present   Anarosa Kubisiak, Metro Kung 11/27/2011, 11:05 AM

## 2011-11-27 NOTE — Op Note (Signed)
[name], [name] NO.:  [id number]  MEDICAL RECORD NO.:  [id number]  LOCATION:  4712                         FACILITY:  [facility]  PHYSICIAN:  Jene Every, M.D.    DATE OF BIRTH:  1918/02/27  DATE OF PROCEDURE:  11/26/2011 DATE OF DISCHARGE:                              OPERATIVE REPORT   PREOPERATIVE DIAGNOSIS:  Femoral neck fracture, left.  POSTOPERATIVE DIAGNOSIS:  Femoral neck fracture, left.  PROCEDURES PERFORMED:  Left hip hemiarthroplasty.  COMPONENTS:  Summit #4 with a 48 bipolar assembly +0 head.  ANESTHESIA:  General.  ASSISTANT:  Roma Schanz, P.A.  HISTORY:  A 78 year old fell, displaced femoral neck.  Indicated for hemiarthroplasty.  Risks discussed including bleeding, infection, dislocation, DVT, PE, anesthetic complications, etc.  TECHNIQUE:  With the patient in supine position, after induction of adequate general anesthesia with 1 g Kefzol, placed in a right lateral decubitus position.  All bony prominences were well padded.  Pelvis was stabilized with a hip holder.  Left lower extremity was then prepped and draped.  Trochanteric region was prepped and draped in usual sterile fashion.  Posterolateral approach to the hip was then made based upon trochanter.  Subcutaneous tissue was dissected.  Electrocautery was utilized to achieve hemostasis.  Fascia lata identified by nylon skin incision.  Self-retaining retractors were placed.  Hypertrophic bursa was noted and excised.  Adductor tenotomy performed.  Piriformis identified with attenuated attached, detached reflected posteriorly after tagging protecting the sciatic nerve throughout the case.  T- shaped capsulotomy was performed.  Fracture hematoma was expressed.  The hip was dislocated.  Fracture appeared to be nonpathologic.  He has an oscillating saw to perform the osteotomy 1 fingerbreadth above the femoral neck.  I then entered the femoral canal with the initiator T- handle  reamer.  Then we used a reamer to a L4 on this summit, used up to a 4 broach.  Lateralizing during the case and we used a lateralizing reamer as well.  This was then copiously irrigated and removed the head, inspected the acetabulum.  No osteoarthritis.  The head was measured at 48.  Then used a permanent 4 femoral prosthesis the appropriate version, impacted it with excellent purchase.  No rotation noted.  I then impacted the 48 bipolar assembly +0 head onto the trunnion and reduced the hip and was stable throughout a full range of motion.  Good leg lengths were noted.  I then repaired the capsule with #1 Ethibond interrupted figure-of-eight sutures.  I repaired the adductor tenotomy with 1 Vicryl interrupted figure-of-eight sutures and the fascia lata with #1 Vicryl interrupted figure of 8 sutures and oversewn with the V- Loc running suture.  Subcu with 0 and 2-0 Vicryl simple sutures.  Skin was reapproximated with staples.  Wound was dressed sterilely.  Prior to closure, there was no active bleeding noted.  Although, she did have interosseous of bleeding throughout the case.  Wound was dressed sterilely.  She was placed supine on hospital bed.  Leg lengths were equivalent.  Pulses intact.  The knee immobilizer placed, extubated without difficulty, and transported to the recovery in satisfactory condition.  The patient tolerated the procedure well.  No complications.  Blood loss was 150 mL.     Jene Every, M.D.     Cordelia Pen  D:  11/26/2011  T:  11/27/2011  Job:  [id number]

## 2011-11-27 NOTE — Progress Notes (Addendum)
Subjective: Patient seen and examined, denies any complaints.  Objective: Vital signs in last 24 hours: Temp:  [99 F (37.2 C)-101.3 F (38.5 C)] 99 F (37.2 C) (05/07 1416) Pulse Rate:  [65-100] 80  (05/07 1416) Resp:  [15-20] 18  (05/07 1416) BP: (96-149)/(31-87) 109/43 mmHg (05/07 1416) SpO2:  [85 %-100 %] 100 % (05/07 1416) Weight:  [62 kg (136 lb 11 oz)] 62 kg (136 lb 11 oz) (05/07 0604) Weight change: 1.3 kg (2 lb 13.9 oz) Last BM Date: 11/24/11  Intake/Output from previous day: 05/06 0701 - 05/07 0700 In: 2428.8 [I.V.:2178.8; IV Piggyback:250] Out: 1650 [Urine:1450; Blood:200] Total I/O In: 1000 [P.O.:700; I.V.:300] Out: 1125 [Urine:1125]   Physical Exam: General: Alert, awake, oriented x3, in no acute distress. Heart: Regular rate and rhythm, without murmurs, rubs, gallops. Lungs: Clear to auscultation bilaterally. Abdomen: Soft, nontender, nondistended, positive bowel sounds. Extremities: No clubbing cyanosis or edema with positive pedal pulses. Neuro: Grossly intact, nonfocal.    Lab Results: Results for orders placed during the hospital encounter of 11/25/11 (from the past 24 hour(s))  HEMOGLOBIN AND HEMATOCRIT, BLOOD     Status: Abnormal   Collection Time   11/26/11  4:56 PM      Component Value Range   Hemoglobin 9.8 (*) 12.0 - 15.0 (g/dL)   HCT 21.3 (*) 08.6 - 46.0 (%)  BASIC METABOLIC PANEL     Status: Abnormal   Collection Time   11/26/11  9:39 PM      Component Value Range   Sodium 136  135 - 145 (mEq/L)   Potassium 4.0  3.5 - 5.1 (mEq/L)   Chloride 103  96 - 112 (mEq/L)   CO2 27  19 - 32 (mEq/L)   Glucose, Bld 144 (*) 70 - 99 (mg/dL)   BUN 23  6 - 23 (mg/dL)   Creatinine, Ser 5.78  0.50 - 1.10 (mg/dL)   Calcium 8.4  8.4 - 46.9 (mg/dL)   GFR calc non Af Amer 48 (*) >90 (mL/min)   GFR calc Af Amer 56 (*) >90 (mL/min)  CBC     Status: Abnormal   Collection Time   11/26/11  9:39 PM      Component Value Range   WBC 8.9  4.0 - 10.5 (K/uL)   RBC  2.78 (*) 3.87 - 5.11 (MIL/uL)   Hemoglobin 8.8 (*) 12.0 - 15.0 (g/dL)   HCT 62.9 (*) 52.8 - 46.0 (%)   MCV 95.0  78.0 - 100.0 (fL)   MCH 31.7  26.0 - 34.0 (pg)   MCHC 33.3  30.0 - 36.0 (g/dL)   RDW 41.3  24.4 - 01.0 (%)   Platelets 90 (*) 150 - 400 (K/uL)  CBC     Status: Abnormal   Collection Time   11/27/11  6:05 AM      Component Value Range   WBC 8.8  4.0 - 10.5 (K/uL)   RBC 2.70 (*) 3.87 - 5.11 (MIL/uL)   Hemoglobin 8.6 (*) 12.0 - 15.0 (g/dL)   HCT 27.2 (*) 53.6 - 46.0 (%)   MCV 94.4  78.0 - 100.0 (fL)   MCH 31.9  26.0 - 34.0 (pg)   MCHC 33.7  30.0 - 36.0 (g/dL)   RDW 64.4  03.4 - 74.2 (%)   Platelets 94 (*) 150 - 400 (K/uL)  BASIC METABOLIC PANEL     Status: Abnormal   Collection Time   11/27/11  6:05 AM      Component Value Range  Sodium 136  135 - 145 (mEq/L)   Potassium 4.2  3.5 - 5.1 (mEq/L)   Chloride 104  96 - 112 (mEq/L)   CO2 26  19 - 32 (mEq/L)   Glucose, Bld 122 (*) 70 - 99 (mg/dL)   BUN 18  6 - 23 (mg/dL)   Creatinine, Ser 4.09  0.50 - 1.10 (mg/dL)   Calcium 8.5  8.4 - 81.1 (mg/dL)   GFR calc non Af Amer 54 (*) >90 (mL/min)   GFR calc Af Amer 63 (*) >90 (mL/min)  DIFFERENTIAL     Status: Abnormal   Collection Time   11/27/11  6:05 AM      Component Value Range   Neutrophils Relative 68  43 - 77 (%)   Neutro Abs 6.0  1.7 - 7.7 (K/uL)   Lymphocytes Relative 10 (*) 12 - 46 (%)   Lymphs Abs 0.9  0.7 - 4.0 (K/uL)   Monocytes Relative 13 (*) 3 - 12 (%)   Monocytes Absolute 1.1 (*) 0.1 - 1.0 (K/uL)   Eosinophils Relative 10 (*) 0 - 5 (%)   Eosinophils Absolute 0.8 (*) 0.0 - 0.7 (K/uL)   Basophils Relative 0  0 - 1 (%)   Basophils Absolute 0.0  0.0 - 0.1 (K/uL)  PROCALCITONIN     Status: Normal   Collection Time   11/27/11  6:05 AM      Component Value Range   Procalcitonin 1.28    URINALYSIS, ROUTINE W REFLEX MICROSCOPIC     Status: Abnormal   Collection Time   11/27/11  6:35 AM      Component Value Range   Color, Urine YELLOW  YELLOW    APPearance CLEAR   CLEAR    Specific Gravity, Urine 1.012  1.005 - 1.030    pH 6.0  5.0 - 8.0    Glucose, UA NEGATIVE  NEGATIVE (mg/dL)   Hgb urine dipstick TRACE (*) NEGATIVE    Bilirubin Urine NEGATIVE  NEGATIVE    Ketones, ur NEGATIVE  NEGATIVE (mg/dL)   Protein, ur NEGATIVE  NEGATIVE (mg/dL)   Urobilinogen, UA 0.2  0.0 - 1.0 (mg/dL)   Nitrite NEGATIVE  NEGATIVE    Leukocytes, UA NEGATIVE  NEGATIVE   URINE MICROSCOPIC-ADD ON     Status: Normal   Collection Time   11/27/11  6:35 AM      Component Value Range   Squamous Epithelial / LPF RARE  RARE    WBC, UA 0-2  <3 (WBC/hpf)   RBC / HPF 0-2  <3 (RBC/hpf)   Urine-Other MUCOUS PRESENT      Studies/Results: Dg Chest 2 View  11/26/2011  *RADIOLOGY REPORT*  Clinical Data: Shortness of breath and weakness.  Hip surgery.  CHEST - 2 VIEW  Comparison: 11/25/2011 and 03/11/2009.  Findings: Trachea is midline.  Heart size stable.  Mild interstitial prominence and indistinctness.  Small bilateral pleural effusions.  Osteopenia with thoracolumbar compression fractures, as before. Degenerative changes in the shoulders.  IMPRESSION: Mild edema with small bilateral pleural effusions.  Original Report Authenticated By: Reyes Ivan, M.D.   Dg Hip Portable 1 View Left  11/26/2011  *RADIOLOGY REPORT*  Clinical Data: Left hip replacement.  Postoperative radiograph.  PORTABLE LEFT HIP - 1 VIEW  Comparison: None.  Findings: Single frontal view of the left hip demonstrates a new left hip bipolar hemiarthroplasty.  Expected postoperative changes in the soft tissues.  No periprosthetic fracture is identified.  IMPRESSION: New left bipolar hip hemiarthroplasty.  Original Report Authenticated By: Andreas Newport, M.D.    Medications:    . sodium chloride   Intravenous Once  . acetaminophen  650 mg Oral BID  .  ceFAZolin (ANCEF) IV  1 g Intravenous Q6H  . docusate sodium  100 mg Oral BID  . enoxaparin  30 mg Subcutaneous Q24H  . folic acid  1 mg Oral Daily  . mirtazapine   15 mg Oral QHS  . mulitivitamin with minerals  1 tablet Oral Daily  . mupirocin ointment  1 application Nasal BID  . pantoprazole  40 mg Oral BID  . sodium chloride  250 mL Intravenous Once  . DISCONTD: amLODipine  5 mg Oral Daily  . DISCONTD: Chlorhexidine Gluconate Cloth  6 each Topical Daily  . DISCONTD: enoxaparin  30 mg Subcutaneous Q24H    acetaminophen, acetaminophen, ALPRAZolam, bisacodyl, HYDROcodone-acetaminophen, HYDROmorphone (DILAUDID) injection, menthol-cetylpyridinium, methocarbamol (ROBAXIN) IV, methocarbamol, metoCLOPramide (REGLAN) injection, metoCLOPramide, ondansetron (ZOFRAN) IV, ondansetron, phenol, polyethylene glycol, DISCONTD: acetaminophen, DISCONTD: acetaminophen, DISCONTD: ALPRAZolam, DISCONTD: HYDROcodone-acetaminophen DISCONTD:  HYDROmorphone (DILAUDID) injection, DISCONTD:  morphine injection, DISCONTD: ondansetron (ZOFRAN) IV, DISCONTD: ondansetron (ZOFRAN) IV, DISCONTD: ondansetron     . sodium chloride 75 mL/hr at 11/27/11 0357  . DISCONTD: lactated ringers 50 mL/hr at 11/26/11 1610    Assessment/Plan:  Fall/Closed left hip fracture:  Status post left hip hemiarthroplasty yesterday. Continue Pain control, PT .  Active Problems:  Fever: She had one episode of temperature of 101 this morning, Unknown etiology, UA unremarkable, blood cultures x2 were sent, check chest x-ray . Will hold off antibiotics at this time, continue to observe. HYPERTENSION : Currently hypotensive Patient was on losartan as outpatient, then was switched to amlodipine given acute kidney injury. Medication on hold given episodes of hypotension yesterday, continue to monitor and resume amlodipine when appropriate.  Acute on chronic kidney injury: Possibly secondary to diuresis, losartan and Lasix was held and creatinine is currently at baseline.  ?CHF : Elevated pro BNP and mild pulmonary edema on chest x-ray. Check echocardiogram . Continue to observe closely while gently  hydrating. Acute and chronic anemia: Acute component most likely secondary to blood loss with surgery, continue to monitor and transfuse for hemoglobin less than 7 Thrombocytopenia: Acute, baseline platelet count was normal on admission, slowly trending down, continue to monitor and consider to discontinue Lovenox if worsens. CORONARY ARTERY DISEASE: Stable  DVT prophylaxis: Continue Lovenox subcutaneous Disposition: Inpatient rehabilitation consulted    LOS: 2 days   Ellyanna Holton 11/27/2011, 3:09 PM

## 2011-11-28 DIAGNOSIS — J159 Unspecified bacterial pneumonia: Secondary | ICD-10-CM

## 2011-11-28 DIAGNOSIS — S72009A Fracture of unspecified part of neck of unspecified femur, initial encounter for closed fracture: Secondary | ICD-10-CM

## 2011-11-28 DIAGNOSIS — R112 Nausea with vomiting, unspecified: Secondary | ICD-10-CM

## 2011-11-28 DIAGNOSIS — I10 Essential (primary) hypertension: Secondary | ICD-10-CM

## 2011-11-28 LAB — CBC
HCT: 23.5 % — ABNORMAL LOW (ref 36.0–46.0)
Hemoglobin: 7.9 g/dL — ABNORMAL LOW (ref 12.0–15.0)
MCH: 31.7 pg (ref 26.0–34.0)
MCHC: 33.6 g/dL (ref 30.0–36.0)

## 2011-11-28 LAB — BASIC METABOLIC PANEL
BUN: 18 mg/dL (ref 6–23)
Calcium: 8.6 mg/dL (ref 8.4–10.5)
GFR calc non Af Amer: 44 mL/min — ABNORMAL LOW (ref >90)
Glucose, Bld: 123 mg/dL — ABNORMAL HIGH (ref 70–99)
Sodium: 137 mEq/L (ref 135–145)

## 2011-11-28 LAB — ABO/RH: ABO/RH(D): O POS

## 2011-11-28 MED ORDER — FUROSEMIDE 10 MG/ML IJ SOLN
20.0000 mg | Freq: Once | INTRAMUSCULAR | Status: AC
  Administered 2011-11-28: 20 mg via INTRAVENOUS
  Filled 2011-11-28: qty 2

## 2011-11-28 MED ORDER — LEVOFLOXACIN IN D5W 500 MG/100ML IV SOLN: 500.0000 mg | INTRAVENOUS | Status: DC

## 2011-11-28 MED ORDER — LEVOFLOXACIN IN D5W 750 MG/150ML IV SOLN
750.0000 mg | INTRAVENOUS | Status: DC
  Administered 2011-11-28 – 2011-11-30 (×2): 750 mg via INTRAVENOUS
  Filled 2011-11-28 (×2): qty 150

## 2011-11-28 MED ORDER — DIPHENHYDRAMINE HCL 50 MG/ML IJ SOLN
12.5000 mg | Freq: Once | INTRAMUSCULAR | Status: AC
Start: 2011-11-28 — End: 2011-11-28
  Administered 2011-11-28: 12.5 mg via INTRAVENOUS
  Filled 2011-11-28: qty 0.25
  Filled 2011-11-28: qty 1
  Filled 2011-11-28: qty 0.25

## 2011-11-28 NOTE — Progress Notes (Signed)
11/28/2011 11:18 PM Recheck of temp was 98.5 oral. Will cont to monitor. Hively, Avie Echevaria, RN

## 2011-11-28 NOTE — Progress Notes (Signed)
Subjective: 2 Days Post-Op Procedure(s) (LRB): ARTHROPLASTY BIPOLAR HIP (Left) Patient reports pain as mild.   Denies CP or SOB.  Objective: Vital signs in last 24 hours: Temp:  [98.6 F (37 C)-101.5 F (38.6 C)] 98.6 F (37 C) (05/08 0443) Pulse Rate:  [80-83] 80  (05/08 0443) Resp:  [16-18] 16  (05/08 0443) BP: (109-132)/(38-58) 132/50 mmHg (05/08 0942) SpO2:  [98 %-100 %] 98 % (05/08 0443) Weight:  [64.3 kg (141 lb 12.1 oz)] 64.3 kg (141 lb 12.1 oz) (05/08 0443)  Intake/Output from previous day: 05/07 0701 - 05/08 0700 In: 1720 [P.O.:820; I.V.:900] Out: 2775 [Urine:2775] Intake/Output this shift: Total I/O In: 120 [P.O.:120] Out: 200 [Urine:200]   Basename 11/28/11 0517 11/27/11 0605 11/26/11 2139 11/26/11 1656 11/26/11 0525  HGB 7.9* 8.6* 8.8* 9.8* 11.1*    Basename 11/28/11 0517 11/27/11 0605  WBC 9.6 8.8  RBC 2.49* 2.70*  HCT 23.5* 25.5*  PLT 95* 94*    Basename 11/28/11 0517 11/27/11 0605  NA 137 136  K 4.3 4.2  CL 105 104  CO2 25 26  BUN 18 18  CREATININE 1.07 0.90  GLUCOSE 123* 122*  CALCIUM 8.6 8.5   No results found for this basename: LABPT:2,INR:2 in the last 72 hours  Neurologically intact Neurovascular intact Sensation intact distally Dorsiflexion/Plantar flexion intact Incision: no drainage No cellulitis present Compartment soft  Assessment/Plan: 2 Days Post-Op Procedure(s) (LRB): ARTHROPLASTY BIPOLAR HIP (Left) Advance diet Up with therapy Daily dressing change Cont to monitor H/H Disposition per medicine pt stable from ortho standpoint   Bryttany Tortorelli R. 11/28/2011, 11:06 AM

## 2011-11-28 NOTE — Progress Notes (Signed)
PT Progress Note  11/28/11 1625  PT Visit Information  Last PT Received On 11/28/11  Assistance Needed +2 (for gait)  PT Time Calculation  PT Start Time 1600  PT Stop Time 1620  PT Time Calculation (min) 20 min  Precautions  Precautions Posterior Hip;Fall  Precaution Comments pt needed cuing for the 90 degree precaution  Required Braces or Orthoses Knee Immobilizer - Left  Knee Immobilizer - Left Other (comment) (used in the bed only)  Restrictions  LLE Weight Bearing PWB  LLE Partial Weight Bearing Percentage or Pounds 50  Cognition  Overall Cognitive Status Appears within functional limits for tasks assessed/performed  Arousal/Alertness Awake/alert  Orientation Level Oriented X4 / Intact  Behavior During Session [facility] for tasks performed  Exercises  Exercises Total Joint  Total Joint Exercises  Ankle Circles/Pumps AROM;Both;20 reps;Supine  Quad Sets AROM;10 reps;Both;Supine  Short Arc Quad Strengthening;Left;10 reps;Supine  Heel Slides AAROM;Left;10 reps;Supine  Hip ABduction/ADduction AAROM;Left;15 reps;Supine  Straight Leg Raises AROM;Right;10 reps;Supine  PT - End of Session  Activity Tolerance Patient tolerated treatment well  Patient left in bed;with call bell/phone within reach  Nurse Communication Mobility status  PT - Assessment/Plan  Comments on Treatment Session Exercises only; pt soon to be able to complete actively  PT Plan Discharge plan remains appropriate  PT Frequency Min 3X/week  Recommendations for Other Services Rehab consult  Follow Up Recommendations Inpatient Rehab  Equipment Recommended Defer to next venue  Acute Rehab PT Goals  PT Goal Formulation With patient  Time For Goal Achievement 12/04/11  Potential to Achieve Goals Good  PT Goal: Perform Home Exercise Program - Progress Progressing toward goal   11/28/2011  [place] Bing, PT [phone] (pager)

## 2011-11-28 NOTE — Plan of Care (Signed)
Problem: Phase I Progression Outcomes Goal: Voiding-avoid urinary catheter unless indicated Outcome: Not Progressing Foley catheter replaced today d/t acute urinary retention

## 2011-11-28 NOTE — Progress Notes (Signed)
Physical Therapy Treatment Patient Details Name: Rafaela Dinius MRN: [id number] DOB: 1917-09-23 Today's Date: 11/28/2011 Time: 1135-1150 PT Time Calculation (min): 15 min  PT Assessment / Plan / Recommendation Comments on Treatment Session  limited session due to pt needed cleaning then needed to return to bed for a bladder scan and in/out cath    Follow Up Recommendations  Inpatient Rehab    Equipment Recommendations  Defer to next venue    Frequency Min 3X/week   Plan Discharge plan remains appropriate    Precautions / Restrictions Precautions Precautions: Posterior Hip;Fall Required Braces or Orthoses: Knee Immobilizer - Left Restrictions Weight Bearing Restrictions: Yes LLE Weight Bearing: Partial weight bearing LLE Partial Weight Bearing Percentage or Pounds: 50   Pertinent Vitals/Pain     Mobility  Bed Mobility Bed Mobility: Sit to Supine Supine to Sit: 2: Max assist Supine to Sit: Patient Percentage: 20% Details for Bed Mobility Assistance: vc's for technique and LE assist to get back into bed Transfers Transfers: Sit to Stand;Stand to Sit Sit to Stand: 2: Max assist;With upper extremity assist;From chair/3-in-1 (times 3) Stand to Sit: 2: Max assist;With upper extremity assist;To chair/3-in-1;To bed Stand Pivot Transfers: 2: Max assist Details for Transfer Assistance: vc's for hand placement and sequencing; assist for maneuvering RW and stability while pivoting and backing up to the bed Ambulation/Gait Ambulation/Gait Assistance: Not tested (comment) (see transfers) Stairs: No Wheelchair Mobility Wheelchair Mobility: No    Exercises     PT Goals Acute Rehab PT Goals PT Goal Formulation: With patient Time For Goal Achievement: 12/04/11 Potential to Achieve Goals: Good PT Goal: Sit to Stand - Progress: Progressing toward goal PT Transfer Goal: Bed to Chair/Chair to Bed - Progress: Progressing toward goal PT Goal: Ambulate - Progress: Progressing toward  goal  Visit Information  Last PT Received On: 11/28/11 Assistance Needed: +2    Subjective Data  Patient Stated Goal: Home, Independent and able to be alone   Cognition  Overall Cognitive Status: Appears within functional limits for tasks assessed/performed Arousal/Alertness: Awake/alert Orientation Level: Oriented X4 / Intact Behavior During Session: [facility] for tasks performed    Balance  Balance Balance Assessed:  (steady standing statically for pericare with min A)  End of Session PT - End of Session Activity Tolerance: Patient tolerated treatment well;Other (comment) Patient left: with call bell/phone within reach;with family/visitor present;in bed Nurse Communication: Mobility status;Weight bearing status;Precautions    Hodan Wurtz, Eliseo Gum 11/28/2011, 12:31 PM  11/28/2011  [place] Bing, PT [phone] (pager)

## 2011-11-28 NOTE — Progress Notes (Signed)
I met with patient and daughter at bedside. Patient with complaints of urinary retention and constipation today. Typically has difficulty with constipation at home, and takes suppositories a couple of times per week. Also states she has recently completed 3 months of iron pills due to low blood which have also caused constipation. I contacted Dr. Venetia Constable by phone to discuss her HGB 7.9 today. He will address when he sees pt today. I have contacted [employer] and am hopeful for approval to admit her to [facility] 5/9 when medically ready. [phone]. RN aware of plan.

## 2011-11-28 NOTE — Plan of Care (Signed)
Problem: Phase I Progression Outcomes Goal: OOB as tolerated unless otherwise ordered Outcome: Completed/Met Date Met:  11/28/11 OOB to chair

## 2011-11-28 NOTE — Progress Notes (Signed)
[name] is a pleasant 78 y.o. female  Who lived alone up to this point, who came in after a fall, resulting in left femoral fracture, now s/p bipolar hemiarthroplasty. I have reviewed her chart, seen and examined her at bed side. Discussed with [name] from CIR. Appreciate ortho/cir. Patient mentions she has been on iron pills for anemia, but stopped before coming to the hospital. She has had some fever. Her CXR suggests some infiltrates- fluid versus consolidation- would favor infection.  SUBJECTIVE Feels ok. C/o dry cough.   1. Hip fracture   2. Hypertension   3. CAD (coronary artery disease)     Past Medical History  Diagnosis Date  . CAD (coronary artery disease)   . GERD (gastroesophageal reflux disease)   . Hypertension   . Diverticulosis   . Hyperlipidemia   . Gastric peptic ulcer   . Allergy   . Anxiety   . Arthritis   . Blood transfusion     when had my hip replaced  . Cataract     removed bil eyes  . Depression   . Osteoporosis   . Stroke     TIA  . Irregular heart beat    Current Facility-Administered Medications  Medication Dose Route Frequency Provider Last Rate Last Dose  . acetaminophen (TYLENOL) tablet 650 mg  650 mg Oral Q6H PRN Clydia Llano, MD       Or  . acetaminophen (TYLENOL) suppository 650 mg  650 mg Rectal Q6H PRN Clydia Llano, MD      . acetaminophen (TYLENOL) tablet 650 mg  650 mg Oral BID Clydia Llano, MD   650 mg at 11/28/11 0939  . ALPRAZolam Prudy Feeler) tablet 0.25 mg  0.25 mg Oral TID PRN Antonieta Pert, MD   0.25 mg at 11/27/11 0918  . bisacodyl (DULCOLAX) suppository 10 mg  10 mg Rectal Daily PRN Liam Graham, PA   10 mg at 11/28/11 0940  . docusate sodium (COLACE) capsule 100 mg  100 mg Oral BID Clydia Llano, MD   100 mg at 11/28/11 0939  . enoxaparin (LOVENOX) injection 30 mg  30 mg Subcutaneous Q24H Antonieta Pert, MD   30 mg at 11/28/11 1232  . folic acid (FOLVITE) tablet 1 mg  1 mg Oral Daily Clydia Llano, MD   1 mg at  11/28/11 0939  . furosemide (LASIX) injection 20 mg  20 mg Intravenous Once Dehaven Sine, MD      . HYDROcodone-acetaminophen (NORCO) 5-325 MG per tablet 1-2 tablet  1-2 tablet Oral Q4H PRN Clydia Llano, MD   1 tablet at 11/28/11 1206  . HYDROmorphone (DILAUDID) injection 1 mg  1 mg Intravenous Q4H PRN Clydia Llano, MD   1 mg at 11/27/11 0941  . levofloxacin (LEVAQUIN) IVPB 750 mg  750 mg Intravenous Q24H Toy Samarin, MD      . menthol-cetylpyridinium (CEPACOL) lozenge 3 mg  1 lozenge Oral PRN Liam Graham, PA       Or  . phenol (CHLORASEPTIC) mouth spray 1 spray  1 spray Mouth/Throat PRN Liam Graham, PA      . methocarbamol (ROBAXIN) tablet 500 mg  500 mg Oral Q6H PRN Liam Graham, PA       Or  . methocarbamol (ROBAXIN) 500 mg in dextrose 5 % 50 mL IVPB  500 mg Intravenous Q6H PRN Liam Graham, PA      . metoCLOPramide (REGLAN) tablet 5-10 mg  5-10 mg Oral Q8H PRN Christine R.  Strader, PA       Or  . metoCLOPramide (REGLAN) injection 5-10 mg  5-10 mg Intravenous Q8H PRN Liam Graham, PA      . mirtazapine (REMERON) tablet 15 mg  15 mg Oral QHS Clydia Llano, MD   15 mg at 11/27/11 2221  . mulitivitamin with minerals tablet 1 tablet  1 tablet Oral Daily Clydia Llano, MD   1 tablet at 11/28/11 0939  . mupirocin ointment (BACTROBAN) 2 % 1 application  1 application Nasal BID Antonieta Pert, MD   1 application at 11/28/11 [phone]  . ondansetron (ZOFRAN) tablet 4 mg  4 mg Oral Q6H PRN Clydia Llano, MD       Or  . ondansetron (ZOFRAN) injection 4 mg  4 mg Intravenous Q6H PRN Clydia Llano, MD   4 mg at 11/25/11 1600  . pantoprazole (PROTONIX) EC tablet 40 mg  40 mg Oral BID Clydia Llano, MD   40 mg at 11/28/11 0939  . polyethylene glycol (MIRALAX / GLYCOLAX) packet 17 g  17 g Oral Daily PRN Liam Graham, PA      . DISCONTD: 0.45 % sodium chloride infusion   Intravenous Continuous Liam Graham, PA 75 mL/hr at 11/27/11 1901 75 mL/hr at 11/27/11  1901  . DISCONTD: levofloxacin (LEVAQUIN) IVPB 500 mg  500 mg Intravenous Q24H Yoali Conry, MD       No Known Allergies Active Problems:  HYPERTENSION  CORONARY ARTERY DISEASE  Closed left hip fracture  Fall   Vital signs in last 24 hours: Temp:  [98.6 F (37 C)-101.5 F (38.6 C)] 98.6 F (37 C) (05/08 0443) Pulse Rate:  [80-83] 80  (05/08 0443) Resp:  [16-18] 16  (05/08 0443) BP: (109-132)/(38-58) 132/50 mmHg (05/08 0942) SpO2:  [98 %-100 %] 98 % (05/08 0443) Weight:  [64.3 kg (141 lb 12.1 oz)] 64.3 kg (141 lb 12.1 oz) (05/08 0443) Weight change: 2.3 kg (5 lb 1.1 oz) Last BM Date: 11/24/11  Intake/Output from previous day: 05/07 0701 - 05/08 0700 In: 1720 [P.O.:820; I.V.:900] Out: 2775 [Urine:2775] Intake/Output this shift: Total I/O In: 546.3 [P.O.:120; I.V.:426.3] Out: 852 [Urine:850; Stool:2]  Lab Results:  [facility] 11/28/11 0517 11/27/11 0605  WBC 9.6 8.8  HGB 7.9* 8.6*  HCT 23.5* 25.5*  PLT 95* 94*   BMET  Basename 11/28/11 0517 11/27/11 0605  NA 137 136  K 4.3 4.2  CL 105 104  CO2 25 26  GLUCOSE 123* 122*  BUN 18 18  CREATININE 1.07 0.90  CALCIUM 8.6 8.5    Studies/Results: Dg Chest Port 1 View  11/27/2011  *RADIOLOGY REPORT*  Clinical Data: Fever, cough, congestion  PORTABLE CHEST - 1 VIEW  Comparison: Portable exam 1547 hours compared to 11/26/2011  Findings: Upper normal heart size. Calcified tortuous aorta. Pulmonary vascularity normal. Emphysematous and bronchitic changes with decreased perihilar interstitial edema. Calcified granuloma lower right chest. No gross pleural effusion or pneumothorax. Osseous demineralization with thoracic scoliosis and bilateral glenohumeral degenerative changes.  IMPRESSION: Slightly improved pulmonary edema.  Original Report Authenticated By: Lollie Marrow, M.D.   Dg Hip Portable 1 View Left  11/26/2011  *RADIOLOGY REPORT*  Clinical Data: Left hip replacement.  Postoperative radiograph.  PORTABLE LEFT HIP - 1 VIEW   Comparison: None.  Findings: Single frontal view of the left hip demonstrates a new left hip bipolar hemiarthroplasty.  Expected postoperative changes in the soft tissues.  No periprosthetic fracture is identified.  IMPRESSION: New left bipolar hip hemiarthroplasty.  Original Report Authenticated By: Andreas Newport, M.D.    Medications: I have reviewed the patient's current medications.   Physical exam GENERAL- alert HEAD- normal atraumatic, no neck masses, normal thyroid, no jvd RESPIRATORY- appears well, vitals normal, no respiratory distress, acyanotic, normal RR, ear and throat exam is normal, neck free of mass or lymphadenopathy, chest clear, no wheezing, crepitations, rhonchi, normal symmetric air entry CVS- regular rate and rhythm, S1, S2 normal, no murmur, click, rub or gallop ABDOMEN- abdomen is soft without significant tenderness, masses, organomegaly or guarding NEURO- Grossly normal EXTREMITIES- extremities normal, atraumatic, no cyanosis or edema. No bleeding from surgical site.  Plan  * PNA- will start levaquin adjusted for crcl, rto complete 7 days. * Acute on chronic blood loss anemia post op- baseline Hb(?11g/dl). Will transfuse 2 units prbc, with lasix. * HYPERTENSION- generally controlled. * CORONARY ARTERY DISEASE- stable. * Closed left hip fracture/ Fall- s/p hemiarthroplasty. For CIR, hopefully tomorrow    Makyla Bye 11/28/2011 1:43 PM Pager: [phone].

## 2011-11-28 NOTE — Progress Notes (Signed)
11/28/2011 2230 Recheck of pt temp is 100.2 after PO tylenol dose and IV benadryl.  MD on call made aware.  Orders to restarted blood and check temp in 30 minutes.  If temp increases will stop blood transfusion and d/c.  If temp decreases or remains the same, will continue with the remainder of blood transfusion.  Will cont to monitor. Hively, Avie Echevaria, RN

## 2011-11-28 NOTE — Progress Notes (Signed)
11/28/2011 2030  Pt receiving second unit PRBC and was noted to have a temp of 100.5 oral.  Pt had a temp last night as high as 101.5 and is receiving scheduled tylenol PO.  MD on call made aware. Orders to give the scheduled tylenol and a dose of IV benadryl and hold blood for one hour. Will recheck temp and call back. Hively, Avie Echevaria, RN

## 2011-11-28 NOTE — Progress Notes (Signed)
Occupational Therapy Treatment Patient Details Name: [name] MRN: [id number] DOB: [birth year] Today's Date: 11/28/2011 Time: 7829-5621 OT Time Calculation (min): 24 min  OT Assessment / Plan / Recommendation Comments on Treatment Session Pt progressing with OT. Will benefit from continued therapy    Follow Up Recommendations  Inpatient Rehab    Equipment Recommendations  Defer to next venue    Frequency     Plan Discharge plan remains appropriate    Precautions / Restrictions Precautions Precautions: Posterior Hip;Fall Required Braces or Orthoses: Knee Immobilizer - Left (when in bed) Restrictions Weight Bearing Restrictions: Yes LLE Weight Bearing: Partial weight bearing LLE Partial Weight Bearing Percentage or Pounds: 50   Pertinent Vitals/Pain Pt c/o LLE pain. RN aware and medicated pt    ADL  Toilet Transfer: Performed;Maximal assistance Toilet Transfer Method: Stand pivot Toileting - Hygiene: Performed;+1 Total assistance Where Assessed - Toileting Hygiene: Standing Equipment Used: Rolling walker Ambulation Related to ADLs: Max A with stand-pivot transfer from EOB to 3n1 and back. Assist to advance bilateral (but especially LLE) and Max VC to increase WB through bilateral UE so as to dec WB on LLE.     OT Goals ADL Goals ADL Goal: Toilet Transfer - Progress: Progressing toward goals  Visit Information  Last OT Received On: 11/28/11 Assistance Needed: +2    Subjective Data      Prior Functioning       Cognition  Overall Cognitive Status: Appears within functional limits for tasks assessed/performed Arousal/Alertness: Awake/alert Orientation Level: Oriented X4 / Intact Behavior During Session: [facility] for tasks performed    Mobility Bed Mobility Bed Mobility: Sit to Supine Supine to Sit: 1: +2 Total assist Supine to Sit: Patient Percentage: 50% Sit to Supine: 1: +2 Total assist Sit to Supine: Patient Percentage: 60% Details for Bed Mobility  Assistance: Assist to bring bilateral LE into bed and control descent of trunk Transfers Sit to Stand: 2: Max assist;From bed;With upper extremity assist;From chair/3-in-1 Stand to Sit: 2: Max assist;With upper extremity assist;To chair/3-in-1;To bed Details for Transfer Assistance: vc's for hand placement    Exercises    Balance Balance Balance Assessed:  (steady standing statically for pericare with min A)  End of Session OT - End of Session Equipment Utilized During Treatment: Gait belt;Left knee immobilizer (KI in bed) Activity Tolerance: Patient tolerated treatment well Patient left: in chair;with call bell/phone within reach;with family/visitor present Nurse Communication: Mobility status   Teria Khachatryan 11/28/2011, 3:32 PM

## 2011-11-28 NOTE — Progress Notes (Signed)
  Echocardiogram 2D Echocardiogram has been performed.  Cathie Beams Deneen 11/28/2011, 3:19 PM

## 2011-11-28 NOTE — Progress Notes (Signed)
Foley cath d/c'ed at 0820. Pt states she needs to urinate but it unable to. In and out cath done. -650cc. Will continue to monitor.

## 2011-11-29 DIAGNOSIS — J159 Unspecified bacterial pneumonia: Secondary | ICD-10-CM

## 2011-11-29 DIAGNOSIS — S72009A Fracture of unspecified part of neck of unspecified femur, initial encounter for closed fracture: Secondary | ICD-10-CM

## 2011-11-29 DIAGNOSIS — R112 Nausea with vomiting, unspecified: Secondary | ICD-10-CM

## 2011-11-29 DIAGNOSIS — I10 Essential (primary) hypertension: Secondary | ICD-10-CM

## 2011-11-29 LAB — CBC
HCT: 30.9 % — ABNORMAL LOW (ref 36.0–46.0)
Hemoglobin: 10.4 g/dL — ABNORMAL LOW (ref 12.0–15.0)
RDW: 15.2 % (ref 11.5–15.5)
WBC: 9.4 10*3/uL (ref 4.0–10.5)

## 2011-11-29 LAB — BASIC METABOLIC PANEL
BUN: 24 mg/dL — ABNORMAL HIGH (ref 6–23)
Chloride: 106 mEq/L (ref 96–112)
GFR calc Af Amer: 55 mL/min — ABNORMAL LOW (ref >90)
Glucose, Bld: 120 mg/dL — ABNORMAL HIGH (ref 70–99)
Potassium: 3.8 mEq/L (ref 3.5–5.1)

## 2011-11-29 LAB — URINE CULTURE
Colony Count: NO GROWTH
Culture  Setup Time: 201305081937

## 2011-11-29 LAB — TYPE AND SCREEN: Unit division: 0

## 2011-11-29 NOTE — Progress Notes (Signed)
[employer] has denied approval for inpt rehab at this time. Undisclosed reason at this time, awaiting faxed denial letter. I have updated Dr. Venetia Constable and RN CM. Daughter is aware and wishes to pursue appeal process if possible. Daughter and pt do not want SNF. Would prefer home with home health if patient progresses well enough with therapy and medical issues over the next few days.I will follow up once denial letter received by acute RN CM. [phone]

## 2011-11-29 NOTE — Progress Notes (Signed)
Small open areas x 4 behind knee on right side, placed meteplex on area.  Small broken area on right hip and below dressing on hip skin cream applied.  All areas blanche.  Client encouraged to move around in bed and turned frequently.  Report called to 5000 and patient transferred.  Thanks, [employer]

## 2011-11-29 NOTE — Progress Notes (Signed)
SUBJECTIVE Feels ok. Fever has subsided. Less cough.   1. Hip fracture   2. Hypertension   3. CAD (coronary artery disease)     Past Medical History  Diagnosis Date  . CAD (coronary artery disease)   . GERD (gastroesophageal reflux disease)   . Hypertension   . Diverticulosis   . Hyperlipidemia   . Gastric peptic ulcer   . Allergy   . Anxiety   . Arthritis   . Blood transfusion     when had my hip replaced  . Cataract     removed bil eyes  . Depression   . Osteoporosis   . Stroke     TIA  . Irregular heart beat    Current Facility-Administered Medications  Medication Dose Route Frequency Provider Last Rate Last Dose  . acetaminophen (TYLENOL) tablet 650 mg  650 mg Oral Q6H PRN Clydia Llano, MD       Or  . acetaminophen (TYLENOL) suppository 650 mg  650 mg Rectal Q6H PRN Clydia Llano, MD      . acetaminophen (TYLENOL) tablet 650 mg  650 mg Oral BID Clydia Llano, MD   650 mg at 11/29/11 1021  . ALPRAZolam Prudy Feeler) tablet 0.25 mg  0.25 mg Oral TID PRN Antonieta Pert, MD   0.25 mg at 11/28/11 2108  . bisacodyl (DULCOLAX) suppository 10 mg  10 mg Rectal Daily PRN Liam Graham, PA   10 mg at 11/28/11 0940  . diphenhydrAMINE (BENADRYL) injection 12.5 mg  12.5 mg Intravenous Once Caroline More, NP   12.5 mg at 11/28/11 2124  . docusate sodium (COLACE) capsule 100 mg  100 mg Oral BID Clydia Llano, MD   100 mg at 11/29/11 1021  . enoxaparin (LOVENOX) injection 30 mg  30 mg Subcutaneous Q24H Antonieta Pert, MD   30 mg at 11/28/11 1232  . folic acid (FOLVITE) tablet 1 mg  1 mg Oral Daily Clydia Llano, MD   1 mg at 11/29/11 1021  . furosemide (LASIX) injection 20 mg  20 mg Intravenous Once Lucella Pommier, MD   20 mg at 11/28/11 1857  . HYDROcodone-acetaminophen (NORCO) 5-325 MG per tablet 1-2 tablet  1-2 tablet Oral Q4H PRN Clydia Llano, MD   1 tablet at 11/28/11 1206  . HYDROmorphone (DILAUDID) injection 1 mg  1 mg Intravenous Q4H PRN Clydia Llano, MD   1 mg at 11/27/11 0941   . levofloxacin (LEVAQUIN) IVPB 750 mg  750 mg Intravenous Q48H Charli Halle, MD   750 mg at 11/28/11 1448  . menthol-cetylpyridinium (CEPACOL) lozenge 3 mg  1 lozenge Oral PRN Liam Graham, PA       Or  . phenol (CHLORASEPTIC) mouth spray 1 spray  1 spray Mouth/Throat PRN Liam Graham, PA      . methocarbamol (ROBAXIN) tablet 500 mg  500 mg Oral Q6H PRN Liam Graham, PA       Or  . methocarbamol (ROBAXIN) 500 mg in dextrose 5 % 50 mL IVPB  500 mg Intravenous Q6H PRN Liam Graham, PA      . metoCLOPramide (REGLAN) tablet 5-10 mg  5-10 mg Oral Q8H PRN Liam Graham, PA       Or  . metoCLOPramide (REGLAN) injection 5-10 mg  5-10 mg Intravenous Q8H PRN Liam Graham, PA      . mirtazapine (REMERON) tablet 15 mg  15 mg Oral QHS Clydia Llano, MD   15 mg at 11/28/11 2107  .  mulitivitamin with minerals tablet 1 tablet  1 tablet Oral Daily Clydia Llano, MD   1 tablet at 11/29/11 1021  . mupirocin ointment (BACTROBAN) 2 % 1 application  1 application Nasal BID Antonieta Pert, MD   1 application at 11/29/11 1022  . ondansetron (ZOFRAN) tablet 4 mg  4 mg Oral Q6H PRN Clydia Llano, MD       Or  . ondansetron (ZOFRAN) injection 4 mg  4 mg Intravenous Q6H PRN Clydia Llano, MD   4 mg at 11/25/11 1600  . pantoprazole (PROTONIX) EC tablet 40 mg  40 mg Oral BID Clydia Llano, MD   40 mg at 11/29/11 1021  . polyethylene glycol (MIRALAX / GLYCOLAX) packet 17 g  17 g Oral Daily PRN Liam Graham, PA      . DISCONTD: 0.45 % sodium chloride infusion   Intravenous Continuous Liam Graham, PA   75 mL/hr at 11/27/11 1901  . DISCONTD: levofloxacin (LEVAQUIN) IVPB 500 mg  500 mg Intravenous Q24H Hollyn Stucky, MD       No Known Allergies Active Problems:  HYPERTENSION  CORONARY ARTERY DISEASE  Closed left hip fracture  Fall   Vital signs in last 24 hours: Temp:  [97.7 F (36.5 C)-100.5 F (38.1 C)] 98.5 F (36.9 C) (05/09 0555) Pulse Rate:   [78-97] 82  (05/09 0555) Resp:  [18-20] 18  (05/09 0555) BP: (132-162)/(38-57) 160/52 mmHg (05/09 0555) SpO2:  [94 %-100 %] 94 % (05/09 0555) Weight:  [63.8 kg (140 lb 10.5 oz)] 63.8 kg (140 lb 10.5 oz) (05/09 0555) Weight change: -0.5 kg (-1 lb 1.6 oz) Last BM Date: 11/28/11  Intake/Output from previous day: 05/08 0701 - 05/09 0700 In: 2900 [P.O.:720; I.V.:1025; Blood:1005; IV Piggyback:150] Out: 3827 [Urine:3825; Stool:2] Intake/Output this shift: Total I/O In: 120 [P.O.:120] Out: 500 [Urine:500]  Lab Results:  [facility] 11/29/11 0645 11/28/11 0517  WBC 9.4 9.6  HGB 10.4* 7.9*  HCT 30.9* 23.5*  PLT 98* 95*   BMET  Basename 11/29/11 0645 11/28/11 0517  NA 140 137  K 3.8 4.3  CL 106 105  CO2 25 25  GLUCOSE 120* 123*  BUN 24* 18  CREATININE 1.01 1.07  CALCIUM 8.8 8.6    Studies/Results: Dg Chest Port 1 View  11/27/2011  *RADIOLOGY REPORT*  Clinical Data: Fever, cough, congestion  PORTABLE CHEST - 1 VIEW  Comparison: Portable exam 1547 hours compared to 11/26/2011  Findings: Upper normal heart size. Calcified tortuous aorta. Pulmonary vascularity normal. Emphysematous and bronchitic changes with decreased perihilar interstitial edema. Calcified granuloma lower right chest. No gross pleural effusion or pneumothorax. Osseous demineralization with thoracic scoliosis and bilateral glenohumeral degenerative changes.  IMPRESSION: Slightly improved pulmonary edema.  Original Report Authenticated By: Lollie Marrow, M.D.    Medications: I have reviewed the patient's current medications.   Physical exam GENERAL- alert HEAD- normal atraumatic, no neck masses, normal thyroid, no jvd RESPIRATORY- appears well, vitals normal, no respiratory distress, acyanotic, normal RR, ear and throat exam is normal, neck free of mass or lymphadenopathy, chest clear, no wheezing, crepitations, rhonchi, normal symmetric air entry CVS- regular rate and rhythm, S1, S2 normal, no murmur, click, rub or  gallop ABDOMEN- abdomen is soft without significant tenderness, masses, organomegaly or guarding NEURO- Grossly normal EXTREMITIES- extremities normal, atraumatic, no cyanosis or edema  Plan  * PNA- Tolerating levaquin, defervescing. Day 2, to complete 7 days.  * Acute on chronic blood loss anemia post op- Appropriately responded to prbc transfusion.  *  HYPERTENSION- generally controlled.  * CORONARY ARTERY DISEASE- stable.  * Closed left hip fracture/ Fall- s/p hemiarthroplasty. For CIR once approved by insurance, Discussed with [name]. If no approval for CIR, then snf vs home.       Ramere Downs 11/29/2011 10:37 AM Pager: [phone].

## 2011-11-29 NOTE — Progress Notes (Signed)
Subjective: 3 Days Post-Op Procedure(s) (LRB): ARTHROPLASTY BIPOLAR HIP (Left) Patient reports pain as mild.   Denies CP or SOB.  Still has foley in place. Positive flatus.  OOB at time of rounds Objective: Vital signs in last 24 hours: Temp:  [97.7 F (36.5 C)-100.5 F (38.1 C)] 98.5 F (36.9 C) (05/09 0555) Pulse Rate:  [78-97] 82  (05/09 0555) Resp:  [18-20] 18  (05/09 1200) BP: (132-162)/(38-57) 160/52 mmHg (05/09 0555) SpO2:  [94 %-100 %] 95 % (05/09 1200) Weight:  [63.8 kg (140 lb 10.5 oz)] 63.8 kg (140 lb 10.5 oz) (05/09 0555)  Intake/Output from previous day: 05/08 0701 - 05/09 0700 In: 2900 [P.O.:720; I.V.:1025; Blood:1005; IV Piggyback:150] Out: 3827 [Urine:3825; Stool:2] Intake/Output this shift: Total I/O In: 240 [P.O.:240] Out: 802 [Urine:800; Stool:2]   Basename 11/29/11 0645 11/28/11 0517 11/27/11 0605 11/26/11 2139 11/26/11 1656  HGB 10.4* 7.9* 8.6* 8.8* 9.8*    Basename 11/29/11 0645 11/28/11 0517  WBC 9.4 9.6  RBC 3.39* 2.49*  HCT 30.9* 23.5*  PLT 98* 95*    Basename 11/29/11 0645 11/28/11 0517  NA 140 137  K 3.8 4.3  CL 106 105  CO2 25 25  BUN 24* 18  CREATININE 1.01 1.07  GLUCOSE 120* 123*  CALCIUM 8.8 8.6   No results found for this basename: LABPT:2,INR:2 in the last 72 hours  Neurologically intact Neurovascular intact Sensation intact distally Dorsiflexion/Plantar flexion intact Incision: dressing C/D/I Compartment soft mild skin breakdown at posterior thigh from immoblizer  Assessment/Plan: 3 Days Post-Op Procedure(s) (LRB): ARTHROPLASTY BIPOLAR HIP (Left) Can have immobilizer off and just pillow between legs if causing skin issues SNF vs HH, inpt rehab has been denied Cont daily dressing changes and skin checks Low dose Lovenox Will sign off at this time but if any acute ortho needs arise please call Follow up with Dr Shelle Iron in 2 weeks PWB 25-50%, THA precautions  Blessen Kimbrough R. 11/29/2011, 1:56 PM

## 2011-11-30 DIAGNOSIS — I10 Essential (primary) hypertension: Secondary | ICD-10-CM

## 2011-11-30 DIAGNOSIS — J159 Unspecified bacterial pneumonia: Secondary | ICD-10-CM

## 2011-11-30 DIAGNOSIS — S72009A Fracture of unspecified part of neck of unspecified femur, initial encounter for closed fracture: Secondary | ICD-10-CM

## 2011-11-30 DIAGNOSIS — R112 Nausea with vomiting, unspecified: Secondary | ICD-10-CM

## 2011-11-30 LAB — CBC
MCHC: 33.8 g/dL (ref 30.0–36.0)
MCV: 93 fL (ref 78.0–100.0)
Platelets: 124 10*3/uL — ABNORMAL LOW (ref 150–400)
RDW: 15 % (ref 11.5–15.5)
WBC: 9.1 10*3/uL (ref 4.0–10.5)

## 2011-11-30 LAB — BASIC METABOLIC PANEL
Calcium: 9 mg/dL (ref 8.4–10.5)
Chloride: 106 mEq/L (ref 96–112)
Creatinine, Ser: 0.86 mg/dL (ref 0.50–1.10)
GFR calc Af Amer: 66 mL/min — ABNORMAL LOW (ref >90)
GFR calc non Af Amer: 57 mL/min — ABNORMAL LOW (ref >90)

## 2011-11-30 NOTE — Clinical Social Work Placement (Addendum)
    Clinical Social Work Department CLINICAL SOCIAL WORK PLACEMENT NOTE 11/30/2011  Patient:  [name],[name]  Account Number:  [id number] Admit date:  11/25/2011  Clinical Social Worker:  Lupita Leash Joangel Vanosdol, BSW  Date/time:  11/30/2011 04:30 PM  Clinical Social Work is seeking post-discharge placement for this patient at the following level of care:   SKILLED NURSING   (*CSW will update this form in Epic as items are completed)   11/30/2011  Patient/family provided with [facility] Department of Clinical Social Work's list of facilities offering this level of care within the geographic area requested by the patient (or if unable, by the patient's family).  11/30/2011  Patient/family informed of their freedom to choose among providers that offer the needed level of care, that participate in Medicare, Medicaid or managed care program needed by the patient, have an available bed and are willing to accept the patient.  11/30/2011  Patient/family informed of [facility]' ownership interest in [facility], as well as of the fact that they are under no obligation to receive care at this facility.  PASARR submitted to [employer] on 11/30/2011 PASARR number received from [employer] on 12/03/11  FL2 transmitted to all facilities in geographic area requested by pt/family on  11/30/2011 FL2 transmitted to all facilities within larger geographic area on   Patient informed that his/her managed care company has contracts with or will negotiate with  certain facilities, including the following:   [facility]     Patient/family informed of bed offers received:  12/03/11 Patient chooses bed at [facility] Physician recommends and patient chooses bed at  SNF   Patient to be transferred to  on  12/04/11 Patient to be transferred to facility by [facility]  The following physician request were entered in Epic:   Additional Comments: Patient prefers [place] IF she has to be placed. Notified [facility] and  [employer]. Lorri Frederick. Trinette Vera,BSW [phone]

## 2011-11-30 NOTE — Progress Notes (Signed)
Daughter contacted me this morning. She is requesting an appeal to [facility] Medicare's decision to deny inpt rehab admission. I have contacted RN CM to request attending MD to appeal decision per daughter's request. I will assist with the appeal process as needed. [phone].

## 2011-11-30 NOTE — Progress Notes (Signed)
PT Progress Note:    11/30/11 0900  PT Visit Information  Last PT Received On 11/30/11  Assistance Needed +2  PT Time Calculation  PT Start Time 0940  PT Stop Time 0956  PT Time Calculation (min) 16 min  Precautions  Precautions Posterior Hip;Fall  Precaution Comments Pt able to verbalize 2/3 precautions.  Cues for 90 degree precaution  Required Braces or Orthoses Knee Immobilizer - Left  Knee Immobilizer - Left Other (comment) (used in bed only)  Restrictions  LLE Weight Bearing PWB  LLE Partial Weight Bearing Percentage or Pounds 50  Cognition  Overall Cognitive Status Appears within functional limits for tasks assessed/performed  Arousal/Alertness Awake/alert  Orientation Level Oriented X4 / Intact  Behavior During Session [name] for tasks performed  Bed Mobility  Bed Mobility Not assessed  Transfers  Transfers Sit to Stand;Stand to Sit  Sit to Stand 3: Mod assist;With upper extremity assist;From chair/3-in-1  Stand to Sit 3: Mod assist;With upper extremity assist;To chair/3-in-1;With armrests  Details for Transfer Assistance Cues for hand placement & technique.  (A) to achieve standing, balance, anterior weight shifting of trunk over BOS, stabilize RW, controlled descent, & safety.    Ambulation/Gait  Ambulation/Gait Assistance 1: +2 Total assist;Other (comment) (+2 to follow with recliner)  Ambulation/Gait: Patient Percentage 80%  Assistive device Rolling walker  Ambulation/Gait Assistance Details Cues for sequencing, advancement of RW, use of UE"s to offload weight of LLE to ensure PWB 50%, upright posture.    Gait Pattern Step-to pattern;Decreased step length - right;Decreased stance time - left  Stairs No  Wheelchair Mobility  Wheelchair Mobility No  PT - End of Session  Equipment Utilized During Treatment Gait belt  Activity Tolerance Patient tolerated treatment well  Patient left in chair;with call bell/phone within reach  Nurse Communication Mobility status  PT  - Assessment/Plan  Comments on Treatment Session Pt progressing with PT goals.  Able to ambulate in today's session.  Very motivated.  Continue to feel pt would be a good CIR candidate.    PT Plan Discharge plan remains appropriate  PT Frequency Min 3X/week  Recommendations for Other Services Rehab consult  Follow Up Recommendations Inpatient Rehab  Equipment Recommended Defer to next venue  Acute Rehab PT Goals  PT Goal: Sit to Stand - Progress Progressing toward goal  PT Goal: Ambulate - Progress Progressing toward goal      [name], [place] [phone] 11/30/2011

## 2011-11-30 NOTE — Progress Notes (Signed)
Ms Miranda continues to do well. She is awaiting appeal decision concerning CIR placement. She is hemodynamically stable, with no acute findings. She should complete 7 days of levaquin for pna/uti. Please see pended d/c summary. Will continue current mx, pending final disposition.  Wyn Nettle,MD pager#[phone].

## 2011-11-30 NOTE — Progress Notes (Signed)
Patient referred to SW for possible SNF placement should CIR be unable to accept patient due to denial of [employer] auth.  Met with patient and also spoke to her daughterJoyce.  Patient really wants to return home if CIR cannot accept but patient; however she and her daughter agreed to SNF search for short term. Prefer [place] area if possible.  May still elect to take patient home with Home Health. Bed search in [place] initiated and referral will be completed to [facility]- for short term SNF.    Lorri Frederick. Jaycub Noorani,BSW  [phone]

## 2011-11-30 NOTE — Progress Notes (Signed)
Pt being followed for progression of care and if she would meet criteria for long term disease management services with the [facility] Care Management program. Post d/c treatment plan being discussed at this time. Call made to [facility] RN CM regarding pt's needs and initial denial of CIR. Will follow up as needed and collaborate with inpt RN CM and RN rehab coordinator to facilitate d/c. Brooke Bonito C. Roena Malady, RN, CCM, [facility] Liaison, # [phone].

## 2011-11-30 NOTE — Clinical Social Work Psychosocial (Signed)
     Clinical Social Work Department BRIEF PSYCHOSOCIAL ASSESSMENT 11/30/2011  Patient:  [name],[name]     Account Number:  [id number]     Admit date:  11/25/2011  Clinical Social Worker:  Burnard Hawthorne  Date/Time:  11/30/2011 04:22 PM  Referred by:  RN  Date Referred:  11/30/2011 Referred for  Other - See comment   Other Referral:   CIR vs SNF   Interview type:  Patient Other interview type:   Also spoke to daughter [name]    PSYCHOSOCIAL DATA Living Status:  ALONE Admitted from facility:   Level of care:   Primary support name:  [name]- daughter [phone] Primary support relationship to patient:  CHILD, ADULT Degree of support available:   Very invovled family. Daughter lives across street from patient.    CURRENT CONCERNS Current Concerns  Other - See comment   Other Concerns:   Wants CIR as first choice- if unable to be admitted- will probably go home with HH/DME. Also agreed to SNF search.    SOCIAL WORK ASSESSMENT / PLAN Patient is alert, oriented and very pleasant. Lives alone but daughter lives next door. She had hoped to return home at d/c; daughter is hopeful that patient can be admitted to CIR.  Patient has [facility] and at this time they have not approved admit. Patient agrees to short term SNF search but may return home if no CIR.   Assessment/plan status:  Psychosocial Support/Ongoing Assessment of Needs Other assessment/ plan:   Information/referral to community resources:   SNF bed list given    PATIENTS/FAMILYS RESPONSE TO PLAN OF CARE: Patient is very sweet and pleasant.  She is very independent and wants to go home if possible.  Daughter and other family are veyr supportive.

## 2011-11-30 NOTE — Discharge Summary (Addendum)
DISCHARGE SUMMARY  [name]  MR#: [id number]  DOB:[birth year]  Date of Admission: 11/25/2011 Date of Discharge: 11/30/2011  Attending Physician:Jaymeson Mengel  Patient's WUJ:WJXBJY, Chrissie Noa, MD, MD  Consults:Treatment Team:  Javier Docker, MD  Discharge Diagnoses: Present on Admission:  .Closed left hip fracture .CORONARY ARTERY DISEASE .Fall .HYPERTENSION   Hospital Course: [name] was admitted on 11/25/11, after she fell at home, breaking her left femoral neck. She has since had athroplasty Bipolar left hip by Dr Shelle Iron. She is awaiting CIR placement, which has been denied by insurance for now, and appeal is in process. If no CIR, then she is open to STR, which CSW is addressing. She required transfusion of 2 units prbc, after a 23 gram drop in hemoglobin, from her baseline of 11, and responded appropriately to the transfusion. She is also being treated for possible pna/uti. She will need to complete 7 days of levaquin. She remains in high spirits. If no CIR, then likely d/c early next week to snf.   D/c meds- to be determined at d/c.   Day of Discharge BP 152/62  Pulse 79  Temp(Src) 99.9 F (37.7 C) (Oral)  Resp 16  Ht 5\' 3"  (1.6 m)  Wt 63.8 kg (140 lb 10.5 oz)  BMI 24.92 kg/m2  SpO2 97%  Physical Exam: Comfortable at rest. Not in distress.  Results for orders placed during the hospital encounter of 11/25/11 (from the past 24 hour(s))  CBC     Status: Abnormal   Collection Time   11/30/11  5:30 AM      Component Value Range   WBC 9.1  4.0 - 10.5 (K/uL)   RBC 3.41 (*) 3.87 - 5.11 (MIL/uL)   Hemoglobin 10.7 (*) 12.0 - 15.0 (g/dL)   HCT 78.2 (*) 95.6 - 46.0 (%)   MCV 93.0  78.0 - 100.0 (fL)   MCH 31.4  26.0 - 34.0 (pg)   MCHC 33.8  30.0 - 36.0 (g/dL)   RDW 21.3  08.6 - 57.8 (%)   Platelets 124 (*) 150 - 400 (K/uL)  BASIC METABOLIC PANEL     Status: Abnormal   Collection Time   11/30/11  5:30 AM      Component Value Range   Sodium 139  135 - 145 (mEq/L)     Potassium 3.9  3.5 - 5.1 (mEq/L)   Chloride 106  96 - 112 (mEq/L)   CO2 24  19 - 32 (mEq/L)   Glucose, Bld 116 (*) 70 - 99 (mg/dL)   BUN 26 (*) 6 - 23 (mg/dL)   Creatinine, Ser 4.69  0.50 - 1.10 (mg/dL)   Calcium 9.0  8.4 - 62.9 (mg/dL)   GFR calc non Af Amer 57 (*) >90 (mL/min)   GFR calc Af Amer 66 (*) >90 (mL/min)    Disposition: to CIR vs STR.   Follow-up Appts: Ortho    Tests Needing Follow-up: cbc  Time spent in discharge (includes decision making & examination of pt): 25 minutes  Signed: Maysun Meditz 11/30/2011, 3:57 PM

## 2011-12-01 DIAGNOSIS — I10 Essential (primary) hypertension: Secondary | ICD-10-CM

## 2011-12-01 DIAGNOSIS — S72009A Fracture of unspecified part of neck of unspecified femur, initial encounter for closed fracture: Secondary | ICD-10-CM

## 2011-12-01 DIAGNOSIS — R112 Nausea with vomiting, unspecified: Secondary | ICD-10-CM

## 2011-12-01 DIAGNOSIS — J159 Unspecified bacterial pneumonia: Secondary | ICD-10-CM

## 2011-12-01 LAB — CBC
HCT: 31.3 % — ABNORMAL LOW (ref 36.0–46.0)
Hemoglobin: 10.4 g/dL — ABNORMAL LOW (ref 12.0–15.0)
MCHC: 33.2 g/dL (ref 30.0–36.0)

## 2011-12-01 LAB — BASIC METABOLIC PANEL
BUN: 24 mg/dL — ABNORMAL HIGH (ref 6–23)
Chloride: 105 mEq/L (ref 96–112)
GFR calc non Af Amer: 60 mL/min — ABNORMAL LOW (ref >90)
Glucose, Bld: 116 mg/dL — ABNORMAL HIGH (ref 70–99)
Potassium: 3.7 mEq/L (ref 3.5–5.1)

## 2011-12-01 MED ORDER — HYDROCODONE-ACETAMINOPHEN 5-325 MG PO TABS
1.0000 | ORAL_TABLET | ORAL | Status: DC | PRN
Start: 2011-12-01 — End: 2011-12-01

## 2011-12-01 MED ORDER — ADULT MULTIVITAMIN W/MINERALS CH: 1.0000 | ORAL_TABLET | Freq: Every day | ORAL | Status: DC

## 2011-12-01 MED ORDER — LEVOFLOXACIN IN D5W 500 MG/100ML IV SOLN
500.0000 mg | Freq: Once | INTRAVENOUS | Status: DC
  Filled 2011-12-01: qty 100

## 2011-12-01 MED ORDER — ACETAMINOPHEN 325 MG PO TABS: 325.0000 mg | ORAL_TABLET | ORAL | Status: DC | PRN

## 2011-12-01 MED ORDER — ONDANSETRON HCL 4 MG PO TABS: 4.0000 mg | ORAL_TABLET | Freq: Four times a day (QID) | ORAL | Status: DC | PRN

## 2011-12-01 MED ORDER — ONDANSETRON HCL 4 MG/2ML IJ SOLN: 4.0000 mg | Freq: Four times a day (QID) | INTRAMUSCULAR | Status: DC | PRN

## 2011-12-01 MED ORDER — FOLIC ACID 1 MG PO TABS
1.0000 mg | ORAL_TABLET | Freq: Every day | ORAL | Status: DC
  Filled 2011-12-01: qty 1

## 2011-12-01 MED ORDER — MIRTAZAPINE 15 MG PO TABS: 15.0000 mg | ORAL_TABLET | Freq: Every day | ORAL | Status: DC

## 2011-12-01 MED ORDER — LEVOFLOXACIN IN D5W 750 MG/150ML IV SOLN
750.0000 mg | INTRAVENOUS | Status: DC
  Administered 2011-12-02: 750 mg via INTRAVENOUS
  Filled 2011-12-01 (×2): qty 150

## 2011-12-01 MED ORDER — METHOCARBAMOL 100 MG/ML IJ SOLN: 500.0000 mg | Freq: Four times a day (QID) | INTRAMUSCULAR | Status: DC | PRN

## 2011-12-01 MED ORDER — ENOXAPARIN SODIUM 30 MG/0.3ML ~~LOC~~ SOLN: 30.0000 mg | SUBCUTANEOUS | Status: DC

## 2011-12-01 MED ORDER — PANTOPRAZOLE SODIUM 40 MG PO TBEC: 40.0000 mg | DELAYED_RELEASE_TABLET | Freq: Two times a day (BID) | ORAL | Status: DC

## 2011-12-01 MED ORDER — LEVOFLOXACIN IN D5W 250 MG/50ML IV SOLN: 250.0000 mg | INTRAVENOUS | Status: DC

## 2011-12-01 MED ORDER — LEVOFLOXACIN IN D5W 750 MG/150ML IV SOLN: 750.0000 mg | INTRAVENOUS | Status: DC

## 2011-12-01 MED ORDER — POLYETHYLENE GLYCOL 3350 17 G PO PACK: 17.0000 g | PACK | Freq: Every day | ORAL | Status: DC | PRN

## 2011-12-01 MED ORDER — METHOCARBAMOL 500 MG PO TABS: 500.0000 mg | ORAL_TABLET | Freq: Four times a day (QID) | ORAL | Status: DC | PRN

## 2011-12-01 MED ORDER — ALPRAZOLAM 0.25 MG PO TABS: 0.2500 mg | ORAL_TABLET | Freq: Three times a day (TID) | ORAL | Status: DC | PRN

## 2011-12-01 MED ORDER — SORBITOL 70 % SOLN: 30.0000 mL | Freq: Every day | Status: DC | PRN

## 2011-12-01 NOTE — Progress Notes (Signed)
Patient with Foley d/c'd at 1800 on 11/30/11.  Patient without void post cath removal, bladder scanned patient, of urine inside.  Patient I+O cathed at 0039 on 12/01/11, expressed.  Patient with spontaneous void post I+O cath.

## 2011-12-01 NOTE — Progress Notes (Signed)
Occupational Therapy Treatment Patient Details Name: [name] MRN: [id number] DOB: 01/31/1918 Today's Date: 12/01/2011 Time: 4098-1191 OT Time Calculation (min): 32 min  OT Assessment / Plan / Recommendation Comments on Treatment Session Pt continues to progress in mobility for ADL.  Will expand goals to address LE ADL.  Pt with many complaints about KI even after repositioned.    Follow Up Recommendations       Barriers to Discharge       Equipment Recommendations  Defer to next venue    Recommendations for Other Services    Frequency Min 2X/week   Plan Discharge plan remains appropriate    Precautions / Restrictions Precautions Precautions: Posterior Hip;Fall Precaution Comments: Pt verbalized 2/3 precautions, but needed cues to generalized 90 degrees with sit to stand. Required Braces or Orthoses: Knee Immobilizer - Left Restrictions Weight Bearing Restrictions: Yes LLE Weight Bearing: Partial weight bearing LLE Partial Weight Bearing Percentage or Pounds: 50   Pertinent Vitals/Pain     ADL  Grooming: Performed;Wash/dry hands;Minimal assistance (min guard assist) Where Assessed - Grooming: Standing at sink Toilet Transfer: Performed;Minimal assistance Toilet Transfer Method: Proofreader: Raised toilet seat with arms (or 3-in-1 over toilet) Toileting - Clothing Manipulation: Performed;+1 Total assistance Where Assessed - Glass blower/designer Manipulation: Standing Toileting - Hygiene: Performed;Minimal assistance (min guard) Where Assessed - Toileting Hygiene: Standing Equipment Used: Gait belt;Rolling walker;Knee Immobilizer Ambulation Related to ADLs: Min guard assist with RW. Sequenced correctly. ADL Comments: Pt with concerns about urinary retention.  Also asked that OT find out if KI could be d/c.    OT Diagnosis:    OT Problem List:   OT Treatment Interventions:     OT Goals Acute Rehab OT Goals OT Goal Formulation: With  patient Time For Goal Achievement: 12/15/11 Potential to Achieve Goals: Good ADL Goals Pt Will Perform Grooming: with supervision;Unsupported;Sitting, edge of bed ADL Goal: Grooming - Progress: Met Pt Will Perform Lower Body Bathing: with mod assist;Sitting, edge of bed;Sit to stand from bed;with adaptive equipment ADL Goal: Lower Body Bathing - Progress: Goal set today Pt Will Perform Lower Body Dressing: with min assist;with adaptive equipment;Sitting, bed;Sit to stand from bed ADL Goal: Lower Body Dressing - Progress: Goal set today Pt Will Transfer to Toilet: with mod assist;3-in-1;Maintaining weight bearing status ADL Goal: Toilet Transfer - Progress: Progressing toward goals Pt Will Perform Toileting - Clothing Manipulation: Sitting on 3-in-1 or toilet;with mod assist;Standing ADL Goal: Toileting - Clothing Manipulation - Progress: Progressing toward goals  Visit Information  Last OT Received On: 12/01/11 Assistance Needed: +1    Subjective Data      Prior Functioning       Cognition       Mobility Bed Mobility Bed Mobility: Sit to Supine Sit to Supine: 3: Mod assist Details for Bed Mobility Assistance: Assist to bring bilateral LE into bed and control descent of trunk Transfers Sit to Stand: 3: Mod assist;With upper extremity assist;From chair/3-in-1 Stand to Sit: 4: Min assist;To chair/3-in-1;To bed;With upper extremity assist (to control descent)   Exercises    Balance    End of Session OT - End of Session Activity Tolerance: Patient tolerated treatment well Patient left: in bed;with nursing in room (nursing asking that pt return to bed for bladder scan) Nurse Communication: Other (comment) (bladder discomfort)   Evern Bio 12/01/2011, 9:01 AM [phone]

## 2011-12-01 NOTE — Progress Notes (Signed)
SUBJECTIVE States still unable to void spontaneously and had to have I/O cath today. O/w denies any c/o. Daughter at bedside   1. Hip fracture   2. Hypertension   3. CAD (coronary artery disease)     Past Medical History  Diagnosis Date  . CAD (coronary artery disease)   . GERD (gastroesophageal reflux disease)   . Hypertension   . Diverticulosis   . Hyperlipidemia   . Gastric peptic ulcer   . Allergy   . Anxiety   . Arthritis   . Blood transfusion     when had my hip replaced  . Cataract     removed bil eyes  . Depression   . Osteoporosis   . Stroke     TIA  . Irregular heart beat    Current Facility-Administered Medications  Medication Dose Route Frequency Provider Last Rate Last Dose  . acetaminophen (TYLENOL) tablet 650 mg  650 mg Oral Q6H PRN Clydia Llano, MD   650 mg at 11/29/11 2000   Or  . acetaminophen (TYLENOL) suppository 650 mg  650 mg Rectal Q6H PRN Clydia Llano, MD      . acetaminophen (TYLENOL) tablet 650 mg  650 mg Oral BID Clydia Llano, MD   650 mg at 12/01/11 1032  . ALPRAZolam Prudy Feeler) tablet 0.25 mg  0.25 mg Oral TID PRN Antonieta Pert, MD   0.25 mg at 12/01/11 0355  . bisacodyl (DULCOLAX) suppository 10 mg  10 mg Rectal Daily PRN Liam Graham, PA   10 mg at 11/30/11 1521  . docusate sodium (COLACE) capsule 100 mg  100 mg Oral BID Clydia Llano, MD   100 mg at 12/01/11 1025  . enoxaparin (LOVENOX) injection 30 mg  30 mg Subcutaneous Q24H Antonieta Pert, MD   30 mg at 12/01/11 1026  . folic acid (FOLVITE) tablet 1 mg  1 mg Oral Daily Clydia Llano, MD   1 mg at 12/01/11 1025  . HYDROcodone-acetaminophen (NORCO) 5-325 MG per tablet 1-2 tablet  1-2 tablet Oral Q4H PRN Clydia Llano, MD   1 tablet at 12/01/11 1043  . HYDROmorphone (DILAUDID) injection 1 mg  1 mg Intravenous Q4H PRN Clydia Llano, MD   1 mg at 11/27/11 0941  . levofloxacin (LEVAQUIN) IVPB 750 mg  750 mg Intravenous Q48H Simbiso Ranga, MD      . menthol-cetylpyridinium (CEPACOL)  lozenge 3 mg  1 lozenge Oral PRN Liam Graham, PA       Or  . phenol (CHLORASEPTIC) mouth spray 1 spray  1 spray Mouth/Throat PRN Liam Graham, PA      . methocarbamol (ROBAXIN) tablet 500 mg  500 mg Oral Q6H PRN Liam Graham, PA   500 mg at 12/01/11 0407   Or  . methocarbamol (ROBAXIN) 500 mg in dextrose 5 % 50 mL IVPB  500 mg Intravenous Q6H PRN Liam Graham, PA      . metoCLOPramide (REGLAN) tablet 5-10 mg  5-10 mg Oral Q8H PRN Liam Graham, PA       Or  . metoCLOPramide (REGLAN) injection 5-10 mg  5-10 mg Intravenous Q8H PRN Liam Graham, PA      . mirtazapine (REMERON) tablet 15 mg  15 mg Oral QHS Clydia Llano, MD   15 mg at 11/30/11 2120  . mulitivitamin with minerals tablet 1 tablet  1 tablet Oral Daily Clydia Llano, MD   1 tablet at 12/01/11 1025  . mupirocin ointment (BACTROBAN) 2 %  1 application  1 application Nasal BID Antonieta Pert, MD   1 application at 11/30/11 2222  . ondansetron (ZOFRAN) tablet 4 mg  4 mg Oral Q6H PRN Clydia Llano, MD       Or  . ondansetron (ZOFRAN) injection 4 mg  4 mg Intravenous Q6H PRN Clydia Llano, MD   4 mg at 11/25/11 1600  . pantoprazole (PROTONIX) EC tablet 40 mg  40 mg Oral BID Clydia Llano, MD   40 mg at 12/01/11 1032  . polyethylene glycol (MIRALAX / GLYCOLAX) packet 17 g  17 g Oral Daily PRN Liam Graham, PA      . DISCONTD: acetaminophen (TYLENOL) tablet 325-650 mg  325-650 mg Oral Q4H PRN Charlton Amor, PA      . DISCONTD: ALPRAZolam Prudy Feeler) tablet 0.25 mg  0.25 mg Oral TID PRN Charlton Amor, PA      . DISCONTD: enoxaparin (LOVENOX) injection 30 mg  30 mg Subcutaneous Q24H Mcarthur Rossetti Angiulli, PA      . DISCONTD: folic acid (FOLVITE) tablet 1 mg  1 mg Oral Daily Mcarthur Rossetti Angiulli, PA      . DISCONTD: HYDROcodone-acetaminophen (NORCO) 5-325 MG per tablet 1-2 tablet  1-2 tablet Oral Q4H PRN Charlton Amor, PA      . DISCONTD: Levofloxacin (LEVAQUIN) IVPB 250 mg  250 mg Intravenous  Q24H Meera Concha Se, PHARMD      . DISCONTD: levofloxacin (LEVAQUIN) IVPB 500 mg  500 mg Intravenous Once Christella Hartigan, PHARMD      . DISCONTD: levofloxacin (LEVAQUIN) IVPB 750 mg  750 mg Intravenous Q48H Simbiso Ranga, MD   750 mg at 11/30/11 1517  . DISCONTD: levofloxacin (LEVAQUIN) IVPB 750 mg  750 mg Intravenous Q48H Mcarthur Rossetti Angiulli, PA      . DISCONTD: methocarbamol (ROBAXIN) 500 mg in dextrose 5 % 50 mL IVPB  500 mg Intravenous Q6H PRN Mcarthur Rossetti Angiulli, PA      . DISCONTD: methocarbamol (ROBAXIN) tablet 500 mg  500 mg Oral Q6H PRN Mcarthur Rossetti Angiulli, PA      . DISCONTD: mirtazapine (REMERON) tablet 15 mg  15 mg Oral QHS Mcarthur Rossetti Angiulli, PA      . DISCONTD: mulitivitamin with minerals tablet 1 tablet  1 tablet Oral Daily Mcarthur Rossetti Angiulli, PA      . DISCONTD: ondansetron (ZOFRAN) injection 4 mg  4 mg Intravenous Q6H PRN Mcarthur Rossetti Angiulli, PA      . DISCONTD: ondansetron (ZOFRAN) tablet 4 mg  4 mg Oral Q6H PRN Mcarthur Rossetti Angiulli, PA      . DISCONTD: pantoprazole (PROTONIX) EC tablet 40 mg  40 mg Oral BID Mcarthur Rossetti Angiulli, PA      . DISCONTD: polyethylene glycol (MIRALAX / GLYCOLAX) packet 17 g  17 g Oral Daily PRN Mcarthur Rossetti Angiulli, PA      . DISCONTD: sorbitol 70 % solution 30 mL  30 mL Oral Daily PRN Mcarthur Rossetti Angiulli, PA       No Known Allergies Active Problems:  HYPERTENSION  CORONARY ARTERY DISEASE  Closed left hip fracture  Fall   Vital signs in last 24 hours: Temp:  [99.1 F (37.3 C)-100.3 F (37.9 C)] 99.9 F (37.7 C) (05/11 2148) Pulse Rate:  [74-77] 74  (05/11 2148) Resp:  [18] 18  (05/11 2148) BP: (144-185)/(54-74) 185/74 mmHg (05/11 2148) SpO2:  [96 %-99 %] 97 % (05/11 2148) Weight change:  Last BM Date: 11/29/11  Intake/Output from  previous day: 05/10 0701 - 05/11 0700 In: 240 [P.O.:240] Out: 1700 [Urine:1700] Intake/Output this shift:    Lab Results:  Basename 12/01/11 0540 11/30/11 0530  WBC 10.2 9.1  HGB 10.4* 10.7*  HCT 31.3* 31.7*  PLT 156 124*    BMET  Basename 12/01/11 0540 11/30/11 0530  NA 140 139  K 3.7 3.9  CL 105 106  CO2 26 24  GLUCOSE 116* 116*  BUN 24* 26*  CREATININE 0.83 0.86  CALCIUM 9.6 9.0    Studies/Results: No results found.  Medications: I have reviewed the patient's current medications.   Physical exam GENERAL- alert HEAD- normal atraumatic, no neck masses, normal thyroid, no jvd RESPIRATORY-  chest clear, no wheezing, crepitations, rhonchi, normal symmetric air entry CVS- regular rate and rhythm, S1, S2 normal, no murmur, click, rub or gallop ABDOMEN- abdomen is soft without significant tenderness, masses, organomegaly or guarding NEURO- Grossly normal EXTREMITIES- extremities normal, no cyanosis or edema  Plan  * PNA- Tolerating levaquin, defervescing. Day 2, to complete 7 days.  * Acute on chronic blood loss anemia post op- Appropriately responded to prbc transfusion. hgb stable * HYPERTENSION- generally controlled.  * CORONARY ARTERY DISEASE- stable.  * Closed left hip fracture/ Fall- s/p hemiarthroplasty. For CIR once approved by insurance, Discussed with [name] per Dr Venetia Constable. If no approval for CIR, then snf vs home. *Urine retention - if still continues to rquire I/O cath will need to have foley replaced and left in for longer prior to attempting voiding trials    Denene Alamillo C 12/01/2011 9:56 PM Pager: [phone].

## 2011-12-02 DIAGNOSIS — J159 Unspecified bacterial pneumonia: Secondary | ICD-10-CM

## 2011-12-02 DIAGNOSIS — R112 Nausea with vomiting, unspecified: Secondary | ICD-10-CM

## 2011-12-02 DIAGNOSIS — S72009A Fracture of unspecified part of neck of unspecified femur, initial encounter for closed fracture: Secondary | ICD-10-CM

## 2011-12-02 DIAGNOSIS — I10 Essential (primary) hypertension: Secondary | ICD-10-CM

## 2011-12-02 LAB — BASIC METABOLIC PANEL
BUN: 23 mg/dL (ref 6–23)
Calcium: 9.7 mg/dL (ref 8.4–10.5)
Creatinine, Ser: 0.92 mg/dL (ref 0.50–1.10)
GFR calc non Af Amer: 53 mL/min — ABNORMAL LOW (ref >90)
Glucose, Bld: 109 mg/dL — ABNORMAL HIGH (ref 70–99)

## 2011-12-02 LAB — CBC
HCT: 30.3 % — ABNORMAL LOW (ref 36.0–46.0)
Hemoglobin: 10.1 g/dL — ABNORMAL LOW (ref 12.0–15.0)
MCH: 30.8 pg (ref 26.0–34.0)
MCHC: 33.3 g/dL (ref 30.0–36.0)

## 2011-12-02 NOTE — Progress Notes (Signed)
Around 2100 patient stated that she feels like she needs to urinate but can not, I encouraged her to allow me to help her to the bathroom.  She ambulated to the bathroom with walker and my stand by assist. Patient voided successfully and was relieved from the bladder pressure.  Approximately 500 ml.  Will continue to monitor.

## 2011-12-02 NOTE — Progress Notes (Signed)
SUBJECTIVE Voided spontaneously x2 last pm, but only 100cc out earlier today and required I/O cath x1 so far today, no other c/o. She really would rather not have foley re-inserted  1. Hip fracture   2. Hypertension   3. CAD (coronary artery disease)     Past Medical History  Diagnosis Date  . CAD (coronary artery disease)   . GERD (gastroesophageal reflux disease)   . Hypertension   . Diverticulosis   . Hyperlipidemia   . Gastric peptic ulcer   . Allergy   . Anxiety   . Arthritis   . Blood transfusion     when had my hip replaced  . Cataract     removed bil eyes  . Depression   . Osteoporosis   . Stroke     TIA  . Irregular heart beat    Current Facility-Administered Medications  Medication Dose Route Frequency Provider Last Rate Last Dose  . acetaminophen (TYLENOL) tablet 650 mg  650 mg Oral Q6H PRN Clydia Llano, MD   650 mg at 12/01/11 2259   Or  . acetaminophen (TYLENOL) suppository 650 mg  650 mg Rectal Q6H PRN Clydia Llano, MD      . acetaminophen (TYLENOL) tablet 650 mg  650 mg Oral BID Clydia Llano, MD   650 mg at 12/02/11 0952  . ALPRAZolam Prudy Feeler) tablet 0.25 mg  0.25 mg Oral TID PRN Antonieta Pert, MD   0.25 mg at 12/01/11 2302  . bisacodyl (DULCOLAX) suppository 10 mg  10 mg Rectal Daily PRN Liam Graham, PA   10 mg at 11/30/11 1521  . docusate sodium (COLACE) capsule 100 mg  100 mg Oral BID Clydia Llano, MD   100 mg at 12/02/11 0953  . enoxaparin (LOVENOX) injection 30 mg  30 mg Subcutaneous Q24H Antonieta Pert, MD   30 mg at 12/01/11 1026  . folic acid (FOLVITE) tablet 1 mg  1 mg Oral Daily Clydia Llano, MD   1 mg at 12/02/11 0953  . HYDROcodone-acetaminophen (NORCO) 5-325 MG per tablet 1-2 tablet  1-2 tablet Oral Q4H PRN Clydia Llano, MD   1 tablet at 12/01/11 1043  . HYDROmorphone (DILAUDID) injection 1 mg  1 mg Intravenous Q4H PRN Clydia Llano, MD   1 mg at 11/27/11 0941  . levofloxacin (LEVAQUIN) IVPB 750 mg  750 mg Intravenous Q48H Simbiso  Ranga, MD      . menthol-cetylpyridinium (CEPACOL) lozenge 3 mg  1 lozenge Oral PRN Liam Graham, PA       Or  . phenol (CHLORASEPTIC) mouth spray 1 spray  1 spray Mouth/Throat PRN Liam Graham, PA      . methocarbamol (ROBAXIN) tablet 500 mg  500 mg Oral Q6H PRN Liam Graham, PA   500 mg at 12/01/11 0407   Or  . methocarbamol (ROBAXIN) 500 mg in dextrose 5 % 50 mL IVPB  500 mg Intravenous Q6H PRN Liam Graham, PA      . metoCLOPramide (REGLAN) tablet 5-10 mg  5-10 mg Oral Q8H PRN Liam Graham, PA       Or  . metoCLOPramide (REGLAN) injection 5-10 mg  5-10 mg Intravenous Q8H PRN Liam Graham, PA      . mirtazapine (REMERON) tablet 15 mg  15 mg Oral QHS Clydia Llano, MD   15 mg at 12/01/11 2259  . mulitivitamin with minerals tablet 1 tablet  1 tablet Oral Daily Clydia Llano, MD   1 tablet at  12/02/11 0953  . ondansetron (ZOFRAN) tablet 4 mg  4 mg Oral Q6H PRN Clydia Llano, MD       Or  . ondansetron (ZOFRAN) injection 4 mg  4 mg Intravenous Q6H PRN Clydia Llano, MD   4 mg at 11/25/11 1600  . pantoprazole (PROTONIX) EC tablet 40 mg  40 mg Oral BID Clydia Llano, MD   40 mg at 12/02/11 0955  . polyethylene glycol (MIRALAX / GLYCOLAX) packet 17 g  17 g Oral Daily PRN Liam Graham, PA      . DISCONTD: acetaminophen (TYLENOL) tablet 325-650 mg  325-650 mg Oral Q4H PRN Charlton Amor, PA      . DISCONTD: ALPRAZolam Prudy Feeler) tablet 0.25 mg  0.25 mg Oral TID PRN Charlton Amor, PA      . DISCONTD: enoxaparin (LOVENOX) injection 30 mg  30 mg Subcutaneous Q24H Mcarthur Rossetti Angiulli, PA      . DISCONTD: folic acid (FOLVITE) tablet 1 mg  1 mg Oral Daily Mcarthur Rossetti Angiulli, PA      . DISCONTD: HYDROcodone-acetaminophen (NORCO) 5-325 MG per tablet 1-2 tablet  1-2 tablet Oral Q4H PRN Charlton Amor, PA      . DISCONTD: Levofloxacin (LEVAQUIN) IVPB 250 mg  250 mg Intravenous Q24H Meera Concha Se, PHARMD      . DISCONTD: levofloxacin (LEVAQUIN) IVPB 500 mg   500 mg Intravenous Once Christella Hartigan, PHARMD      . DISCONTD: levofloxacin (LEVAQUIN) IVPB 750 mg  750 mg Intravenous Q48H Simbiso Ranga, MD   750 mg at 11/30/11 1517  . DISCONTD: levofloxacin (LEVAQUIN) IVPB 750 mg  750 mg Intravenous Q48H Mcarthur Rossetti Angiulli, PA      . DISCONTD: methocarbamol (ROBAXIN) 500 mg in dextrose 5 % 50 mL IVPB  500 mg Intravenous Q6H PRN Mcarthur Rossetti Angiulli, PA      . DISCONTD: methocarbamol (ROBAXIN) tablet 500 mg  500 mg Oral Q6H PRN Mcarthur Rossetti Angiulli, PA      . DISCONTD: mirtazapine (REMERON) tablet 15 mg  15 mg Oral QHS Mcarthur Rossetti Angiulli, PA      . DISCONTD: mulitivitamin with minerals tablet 1 tablet  1 tablet Oral Daily Mcarthur Rossetti Angiulli, PA      . DISCONTD: ondansetron (ZOFRAN) injection 4 mg  4 mg Intravenous Q6H PRN Mcarthur Rossetti Angiulli, PA      . DISCONTD: ondansetron (ZOFRAN) tablet 4 mg  4 mg Oral Q6H PRN Mcarthur Rossetti Angiulli, PA      . DISCONTD: pantoprazole (PROTONIX) EC tablet 40 mg  40 mg Oral BID Mcarthur Rossetti Angiulli, PA      . DISCONTD: polyethylene glycol (MIRALAX / GLYCOLAX) packet 17 g  17 g Oral Daily PRN Mcarthur Rossetti Angiulli, PA      . DISCONTD: sorbitol 70 % solution 30 mL  30 mL Oral Daily PRN Mcarthur Rossetti Angiulli, PA       No Known Allergies Active Problems:  HYPERTENSION  CORONARY ARTERY DISEASE  Closed left hip fracture  Fall   Vital signs in last 24 hours: Temp:  [97.5 F (36.4 C)-99.9 F (37.7 C)] 97.5 F (36.4 C) (05/12 0617) Pulse Rate:  [65-79] 65  (05/12 0617) Resp:  [16-18] 16  (05/12 0617) BP: (151-185)/(64-84) 151/64 mmHg (05/12 0617) SpO2:  [97 %] 97 % (05/12 0617) Weight change:  Last BM Date: 11/30/11  Intake/Output from previous day: 05/11 0701 - 05/12 0700 In: 510 [P.O.:510] Out: 2800 [Urine:2800] Intake/Output this shift:  Lab Results:  Basename 12/02/11 0517 12/01/11 0540  WBC 9.6 10.2  HGB 10.1* 10.4*  HCT 30.3* 31.3*  PLT 189 156   BMET  Basename 12/02/11 0517 12/01/11 0540  NA 142 140  K 3.9 3.7  CL 108  105  CO2 27 26  GLUCOSE 109* 116*  BUN 23 24*  CREATININE 0.92 0.83  CALCIUM 9.7 9.6    Studies/Results: No results found.  Medications: I have reviewed the patient's current medications.   Physical exam GENERAL- alert, in NAD HEENT- normal, atraumatic, no neck masses, normal thyroid, no jvd RESPIRATORY-  chest clear, no wheezing, crepitations, rhonchi, normal symmetric air entry CVS- regular rate and rhythm, S1, S2 normal, no murmur, click, rub or gallop ABDOMEN- abdomen is soft, no tenderness, masses, organomegaly or guarding NEURO- Grossly normal EXTREMITIES- extremities normal, no cyanosis or edema  Plan  * PNA- Tolerating levaquin, plan is to complete total of 7 days,  remaining afebrile,  * Acute on chronic blood loss anemia post op- Appropriately responded to prbc transfusion. hgb remains stable * HYPERTENSION-.  * CORONARY ARTERY DISEASE- stable.  * Closed left hip fracture/ Fall- s/p hemiarthroplasty. For CIR once approved by insurance, Discussed with [name] per Dr Venetia Constable. If no approval for CIR, then snf vs home. *Urine retention - some improvement, I/O x1 as discussed above, but she would rather not have foley re-inserted- monitor closely and further manage as appropriate  Orlyn Odonoghue C 12/02/2011 3:06 PM Pager: [phone].

## 2011-12-02 NOTE — Progress Notes (Signed)
Orthopedics Progress Note  Subjective: Pt resting with minimal to no pain to left hip today States she is doing well  Objective:  Filed Vitals:   12/02/11 0617  BP: 151/64  Pulse: 65  Temp: 97.5 F (36.4 C)  Resp: 16    General: Awake and alert  Musculoskeletal: left hip with minimal soreness with log roll, nv intact distally Neurovascularly intact  Lab Results  Component Value Date   WBC 9.6 12/02/2011   HGB 10.1* 12/02/2011   HCT 30.3* 12/02/2011   MCV 92.4 12/02/2011   PLT 189 12/02/2011       Component Value Date/Time   NA 142 12/02/2011 0517   K 3.9 12/02/2011 0517   CL 108 12/02/2011 0517   CO2 27 12/02/2011 0517   GLUCOSE 109* 12/02/2011 0517   BUN 23 12/02/2011 0517   CREATININE 0.92 12/02/2011 0517   CALCIUM 9.7 12/02/2011 0517   GFRNONAA 53* 12/02/2011 0517   GFRAA 61* 12/02/2011 0517    Lab Results  Component Value Date   INR 1.04 11/25/2011   INR 1.03 05/09/2011   INR 1.0 01/17/2009    Assessment/Plan: POD  s/p Procedure(s): left ARTHROPLASTY BIPOLAR HIP  D/c planning PT/OT  Pt doing well from ortho standpoint  Viviann Spare R. Ranell Patrick, MD 12/02/2011 7:13 AM

## 2011-12-02 NOTE — Progress Notes (Signed)
Orthopedic Tech Progress Note Patient Details:  [name] Sep 30, 1917 [id number] Trapeze bar patient helper;rn could find no orders for cpm,therefore one was not provided     [name], [name] 12/02/2011, 3:03 PM

## 2011-12-03 DIAGNOSIS — S72009A Fracture of unspecified part of neck of unspecified femur, initial encounter for closed fracture: Secondary | ICD-10-CM

## 2011-12-03 DIAGNOSIS — J159 Unspecified bacterial pneumonia: Secondary | ICD-10-CM

## 2011-12-03 DIAGNOSIS — R112 Nausea with vomiting, unspecified: Secondary | ICD-10-CM

## 2011-12-03 DIAGNOSIS — I10 Essential (primary) hypertension: Secondary | ICD-10-CM

## 2011-12-03 LAB — CBC
MCH: 31 pg (ref 26.0–34.0)
MCV: 92.8 fL (ref 78.0–100.0)
Platelets: 216 10*3/uL (ref 150–400)
RDW: 14.4 % (ref 11.5–15.5)
WBC: 9 10*3/uL (ref 4.0–10.5)

## 2011-12-03 LAB — COMPREHENSIVE METABOLIC PANEL
ALT: 9 U/L (ref 0–35)
AST: 13 U/L (ref 0–37)
Albumin: 2 g/dL — ABNORMAL LOW (ref 3.5–5.2)
Calcium: 9.3 mg/dL (ref 8.4–10.5)
Chloride: 106 mEq/L (ref 96–112)
Creatinine, Ser: 0.83 mg/dL (ref 0.50–1.10)
Sodium: 140 mEq/L (ref 135–145)

## 2011-12-03 LAB — DIFFERENTIAL
Lymphocytes Relative: 13 % (ref 12–46)
Lymphs Abs: 1.2 10*3/uL (ref 0.7–4.0)
Monocytes Absolute: 1.1 10*3/uL — ABNORMAL HIGH (ref 0.1–1.0)
Monocytes Relative: 12 % (ref 3–12)
Neutro Abs: 6.1 10*3/uL (ref 1.7–7.7)

## 2011-12-03 LAB — CULTURE, BLOOD (ROUTINE X 2): Culture: NO GROWTH

## 2011-12-03 MED ORDER — TAMSULOSIN HCL 0.4 MG PO CAPS
0.4000 mg | ORAL_CAPSULE | Freq: Once | ORAL | Status: AC
  Administered 2011-12-03: 0.4 mg via ORAL
  Filled 2011-12-03: qty 1

## 2011-12-03 NOTE — Progress Notes (Signed)
  CARE MANAGEMENT NOTE 12/03/2011  Comments:  12/03/11 1100 Vance Peper, RN BSN [phone] Per Ottie Glazier patient is not meeting critieria for INPT rephab-functioning too high a level. Spoke with patient. She feels SNF is needed for shortterm rehab. Social Worker to speak with patient and daughter. CM provided patient with [facility] brochure and discussed the benefits of [facility].

## 2011-12-03 NOTE — Progress Notes (Signed)
Patient has gone to the bathroom with assistance several times to see if she could void with no success. At, 2230 we did a bladder scan and patient had 738 ml urine in bladder, we did an I/O cath and 900 ml of urine was removed.  Patient will be due to void around 7 am.  Patient stated that she does not want a foley in place.  Will continue to monitor.

## 2011-12-03 NOTE — Progress Notes (Signed)
Patient has worked with therapy this morning. Ambulating well, but urinary retention issues remain. I contacted her daughter, Alona Bene, by phone and she is aware. She has concerns with taking Mom home with voiding problems. She may request SNF unless issue is resolved. I have alerted RN CM to discuss with daughter. Daughter is also aware that patient is requesting to go home with home health. Please call me at [phone] with questions.

## 2011-12-03 NOTE — Progress Notes (Signed)
SUBJECTIVE "Iam ok, if only I can void".   1. Hip fracture   2. Hypertension   3. CAD (coronary artery disease)     Past Medical History  Diagnosis Date  . CAD (coronary artery disease)   . GERD (gastroesophageal reflux disease)   . Hypertension   . Diverticulosis   . Hyperlipidemia   . Gastric peptic ulcer   . Allergy   . Anxiety   . Arthritis   . Blood transfusion     when had my hip replaced  . Cataract     removed bil eyes  . Depression   . Osteoporosis   . Stroke     TIA  . Irregular heart beat    Current Facility-Administered Medications  Medication Dose Route Frequency Provider Last Rate Last Dose  . acetaminophen (TYLENOL) tablet 650 mg  650 mg Oral Q6H PRN Clydia Llano, MD   650 mg at 12/01/11 2259   Or  . acetaminophen (TYLENOL) suppository 650 mg  650 mg Rectal Q6H PRN Clydia Llano, MD      . acetaminophen (TYLENOL) tablet 650 mg  650 mg Oral BID Clydia Llano, MD   650 mg at 12/03/11 1011  . ALPRAZolam Prudy Feeler) tablet 0.25 mg  0.25 mg Oral TID PRN Antonieta Pert, MD   0.25 mg at 12/02/11 2341  . bisacodyl (DULCOLAX) suppository 10 mg  10 mg Rectal Daily PRN Liam Graham, PA   10 mg at 11/30/11 1521  . docusate sodium (COLACE) capsule 100 mg  100 mg Oral BID Clydia Llano, MD   100 mg at 12/03/11 1011  . enoxaparin (LOVENOX) injection 30 mg  30 mg Subcutaneous Q24H Antonieta Pert, MD   30 mg at 12/03/11 1011  . folic acid (FOLVITE) tablet 1 mg  1 mg Oral Daily Clydia Llano, MD   1 mg at 12/03/11 1011  . HYDROcodone-acetaminophen (NORCO) 5-325 MG per tablet 1-2 tablet  1-2 tablet Oral Q4H PRN Clydia Llano, MD   2 tablet at 12/02/11 1537  . HYDROmorphone (DILAUDID) injection 1 mg  1 mg Intravenous Q4H PRN Clydia Llano, MD   1 mg at 11/27/11 0941  . levofloxacin (LEVAQUIN) IVPB 750 mg  750 mg Intravenous Q48H Maekayla Giorgio, MD   750 mg at 12/02/11 1537  . menthol-cetylpyridinium (CEPACOL) lozenge 3 mg  1 lozenge Oral PRN Liam Graham, PA       Or   . phenol (CHLORASEPTIC) mouth spray 1 spray  1 spray Mouth/Throat PRN Liam Graham, PA      . methocarbamol (ROBAXIN) tablet 500 mg  500 mg Oral Q6H PRN Liam Graham, PA   500 mg at 12/01/11 0407   Or  . methocarbamol (ROBAXIN) 500 mg in dextrose 5 % 50 mL IVPB  500 mg Intravenous Q6H PRN Liam Graham, PA      . metoCLOPramide (REGLAN) tablet 5-10 mg  5-10 mg Oral Q8H PRN Liam Graham, PA       Or  . metoCLOPramide (REGLAN) injection 5-10 mg  5-10 mg Intravenous Q8H PRN Liam Graham, PA      . mirtazapine (REMERON) tablet 15 mg  15 mg Oral QHS Clydia Llano, MD   15 mg at 12/02/11 2341  . mulitivitamin with minerals tablet 1 tablet  1 tablet Oral Daily Clydia Llano, MD   1 tablet at 12/03/11 1011  . ondansetron (ZOFRAN) tablet 4 mg  4 mg Oral Q6H PRN Clydia Llano, MD  Or  . ondansetron (ZOFRAN) injection 4 mg  4 mg Intravenous Q6H PRN Clydia Llano, MD   4 mg at 11/25/11 1600  . pantoprazole (PROTONIX) EC tablet 40 mg  40 mg Oral BID Clydia Llano, MD   40 mg at 12/03/11 1011  . polyethylene glycol (MIRALAX / GLYCOLAX) packet 17 g  17 g Oral Daily PRN Liam Graham, PA      . Tamsulosin HCl (FLOMAX) capsule 0.4 mg  0.4 mg Oral Once Makynzi Eastland, MD       No Known Allergies Active Problems:  HYPERTENSION  CORONARY ARTERY DISEASE  Closed left hip fracture  Fall   Vital signs in last 24 hours: Temp:  [99 F (37.2 C)-99.4 F (37.4 C)] 99.4 F (37.4 C) (05/13 0528) Pulse Rate:  [70-77] 70  (05/13 0528) Resp:  [16-18] 18  (05/13 0528) BP: (149-178)/(45-69) 149/68 mmHg (05/13 0528) SpO2:  [96 %-98 %] 96 % (05/13 0528) Weight change:  Last BM Date: 12/02/11  Intake/Output from previous day: 05/12 0701 - 05/13 0700 In: 240 [P.O.:240] Out: 2302 [Urine:2300; Stool:2] Intake/Output this shift:    Lab Results:  Basename 12/03/11 0540 12/02/11 0517  WBC 9.0 9.6  HGB 9.9* 10.1*  HCT 29.6* 30.3*  PLT 216 189   BMET  Basename  12/03/11 0540 12/02/11 0517  NA 140 142  K 3.7 3.9  CL 106 108  CO2 26 27  GLUCOSE 119* 109*  BUN 21 23  CREATININE 0.83 0.92  CALCIUM 9.3 9.7    Studies/Results: No results found.  Medications: I have reviewed the patient's current medications.   Physical exam GENERAL- alert HEAD- normal atraumatic, no neck masses, normal thyroid, no jvd RESPIRATORY- appears well, vitals normal, no respiratory distress, acyanotic, normal RR, ear and throat exam is normal, neck free of mass or lymphadenopathy, chest clear, no wheezing, crepitations, rhonchi, normal symmetric air entry CVS- regular rate and rhythm, S1, S2 normal, no murmur, click, rub or gallop ABDOMEN- abdomen is soft without significant tenderness, masses, organomegaly or guarding NEURO- Grossly normal EXTREMITIES- extremities normal, atraumatic, no cyanosis or edema  Plan  * PNA- Tolerating levaquin, plan is to complete total of 7 days,  remaining afebrile,  * Acute on chronic blood loss anemia post op- Appropriately responded to prbc transfusion. hgb remains stable  * HYPERTENSION-.  * CORONARY ARTERY DISEASE- stable.  * Closed left hip fracture/ Fall- s/p hemiarthroplasty. For SNF once bed available. *Urine retention - continues to have problems. Added flomax trial Will ask urology to evaluate if no response.   Taishaun Levels 12/03/2011 1:09 PM Pager: [phone].

## 2011-12-03 NOTE — Progress Notes (Signed)
Physical Therapy Treatment Patient Details Name: [name]: [id number] DOB: 10/16/1917 Today's Date: 12/03/2011 Time: 3086-5784 PT Time Calculation (min): 22 min  PT Assessment / Plan / Recommendation Comments on Treatment Session  Pt continues to make progress with PT goals.  Very motivated.  Pt reports being discouraged due to fact that she is unable to void at this time & having a lot of pressure.  Spoke with [name], CIR admission coordinator, who states pt & pt's daughter would like for pt to return home when medically cleared by MD if pt mobilizing well enough.  [name] states pt will have 24 hr (A) at home per daughter.  Pt will need to practice steps with daughter if plan is to go directly home.  Pt will need HHPT & RW with 5" wheels if goes home.      Follow Up Recommendations  Inpatient Rehab    Barriers to Discharge        Equipment Recommendations  Defer to next venue    Recommendations for Other Services    Frequency Min 3X/week   Plan Discharge plan remains appropriate    Precautions / Restrictions Precautions Precautions: Posterior Hip;Fall Precaution Comments: Pt required min cueing to verbalize "No bending more than 90 degree" + "No internal Rotation" Required Braces or Orthoses: Knee Immobilizer - Left Knee Immobilizer - Left:  (needs it on in bed when sleeping) Restrictions LLE Weight Bearing: Partial weight bearing LLE Partial Weight Bearing Percentage or Pounds: 50       Mobility  Bed Mobility Bed Mobility: Sit to Supine Sit to Supine: 3: Mod assist;HOB flat Details for Bed Mobility Assistance: (A) for LE's & to correctly position body when supine in bed.  Cues for technique.  Transfers Transfers: Sit to Stand;Stand to Sit Sit to Stand: 3: Mod assist;4: Min assist;With armrests;With upper extremity assist;From chair/3-in-1 Stand to Sit: 4: Min assist;With armrests;With upper extremity assist;To chair/3-in-1;To bed Details for Transfer  Assistance: Cues for hand placement, LLE positioning, & technique.  Mod (A) to stand from recliner (lower surface) & Min (A) to stand from 3-in-1.   Ambulation/Gait Ambulation/Gait Assistance: 4: Min guard Ambulation Distance (Feet): 120 Feet Assistive device: Rolling walker Ambulation/Gait Assistance Details: Cues for sequencing with initial few steps, reinforcement of PWB'ing LLE & use of UE"s to offload weight on LLE, cues for incresaed upright posture.   Gait Pattern: Step-to pattern;Step-through pattern;Decreased stance time - left;Decreased step length - right Stairs: No Wheelchair Mobility Wheelchair Mobility: No    Exercises Total Joint Exercises Ankle Circles/Pumps: AROM;Both;20 reps;Supine Gluteal Sets: AROM;Strengthening;15 reps;Supine Heel Slides: AROM;10 reps;Supine Hip ABduction/ADduction: AAROM;Strengthening;Left;Supine;15 reps Long Arc Quad: AROM;Strengthening;Left;15 reps;Seated Marching in Standing: Strengthening;15 reps;AAROM;Seated   PT Diagnosis:    PT Problem List:   PT Treatment Interventions:     PT Goals Acute Rehab PT Goals PT Goal: Sit to Stand - Progress: Met PT Goal: Ambulate - Progress: Met PT Goal: Perform Home Exercise Program - Progress: Progressing toward goal  Visit Information  Last PT Received On: 12/03/11 Assistance Needed: +1    Subjective Data      Cognition  Overall Cognitive Status: Appears within functional limits for tasks assessed/performed Arousal/Alertness: Awake/alert Orientation Level: Oriented X4 / Intact Behavior During Session: [facility] for tasks performed    Balance     End of Session PT - End of Session Equipment Utilized During Treatment: Gait belt Activity Tolerance: Patient tolerated treatment well Patient left: in bed;with call bell/phone within reach    [place],  [name] 12/03/2011, 8:37 AM  Verdell Face, PTA [phone] 12/03/2011

## 2011-12-03 NOTE — Progress Notes (Signed)
Utilization review completed. Vibhav Waddill, RN, BSN. 12/03/11  

## 2011-12-04 DIAGNOSIS — S72009A Fracture of unspecified part of neck of unspecified femur, initial encounter for closed fracture: Secondary | ICD-10-CM

## 2011-12-04 DIAGNOSIS — J159 Unspecified bacterial pneumonia: Secondary | ICD-10-CM

## 2011-12-04 DIAGNOSIS — I10 Essential (primary) hypertension: Secondary | ICD-10-CM

## 2011-12-04 DIAGNOSIS — R112 Nausea with vomiting, unspecified: Secondary | ICD-10-CM

## 2011-12-04 LAB — BASIC METABOLIC PANEL
BUN: 21 mg/dL (ref 6–23)
Calcium: 9.1 mg/dL (ref 8.4–10.5)
Creatinine, Ser: 0.83 mg/dL (ref 0.50–1.10)
GFR calc non Af Amer: 60 mL/min — ABNORMAL LOW (ref >90)
Glucose, Bld: 119 mg/dL — ABNORMAL HIGH (ref 70–99)
Potassium: 4.1 mEq/L (ref 3.5–5.1)

## 2011-12-04 LAB — CBC
HCT: 30.3 % — ABNORMAL LOW (ref 36.0–46.0)
Hemoglobin: 10 g/dL — ABNORMAL LOW (ref 12.0–15.0)
MCH: 30.7 pg (ref 26.0–34.0)
MCHC: 33 g/dL (ref 30.0–36.0)
MCV: 92.9 fL (ref 78.0–100.0)

## 2011-12-04 MED ORDER — TAMSULOSIN HCL 0.4 MG PO CAPS: 0.4000 mg | ORAL_CAPSULE | Freq: Once | ORAL | Status: DC

## 2011-12-04 MED ORDER — ALPRAZOLAM 0.25 MG PO TABS: 0.2500 mg | ORAL_TABLET | Freq: Every evening | ORAL | Status: DC | PRN

## 2011-12-04 MED ORDER — LEVOFLOXACIN 500 MG PO TABS: 500.0000 mg | ORAL_TABLET | Freq: Every day | ORAL | Status: AC

## 2011-12-04 MED ORDER — ENOXAPARIN SODIUM 30 MG/0.3ML ~~LOC~~ SOLN: 30.0000 mg | SUBCUTANEOUS | Status: DC

## 2011-12-04 MED ORDER — METHOCARBAMOL 500 MG PO TABS: 500.0000 mg | ORAL_TABLET | Freq: Four times a day (QID) | ORAL | Status: AC | PRN

## 2011-12-04 MED ORDER — HYDROCODONE-ACETAMINOPHEN 5-325 MG PO TABS: 1.0000 | ORAL_TABLET | ORAL | Status: AC | PRN

## 2011-12-04 MED ORDER — TAMSULOSIN HCL 0.4 MG PO CAPS
0.4000 mg | ORAL_CAPSULE | Freq: Once | ORAL | Status: AC
  Administered 2011-12-04: 0.4 mg via ORAL
  Filled 2011-12-04 (×2): qty 1

## 2011-12-04 NOTE — Progress Notes (Signed)
Pt to transfer to [facility] today via [facility]. [facility] admissions coordinator to meet in pt's room with dtr to complete ppw. Blue Medicare Auth received. No other CSW needs reported or noted. CSW signing off.  Dellie Burns, MSW, [place] [phone] (ortho coverage)

## 2011-12-04 NOTE — Discharge Summary (Signed)
DISCHARGE SUMMARY  [name]  MR#: [id number]  DOB:1918-02-11  Date of Admission: 11/25/2011 Date of Discharge: 12/04/2011  Attending Physician:[name]  Patient'Odom NWG:NFAOZH, Diane Noa, MD, MD  Consults:Treatment Team:  Diane Docker, MD [name]  Discharge Diagnoses: Present on Admission:  .Closed left hip fracture .CORONARY ARTERY DISEASE .Fall .HYPERTENSION UTI    Hospital Course: [name] was admitted on 11/25/11 after she fell and broke her left femoral neck. She had Arthroplasty Bipolar hip(left) done by [name] on 11/26/11. She required transfusion of 2 units PRBC for acute on chronic anemia related to blood loss perioperatively. She was also found to have a uti, and possible early pna, and should complete a few more days of levaquin. Over the weekend, she developed urinary retention, and required in and out catheterization. Yesterday, she was started on flomax, and she has voided spontaneously since this morning. The urinary retention seems transient(meds related, versus uti versus surgery). Patient'Odom daughter was concerned about her being discharged without Odom consult, which I did by calling [name] who recommended follow up with [name] in the office. Patient'Odom daughter preferred [name]. Family will call for the appointment. I have left [name] on a few days of flomax as this seems to be helping. I doubt that she will need this for the long term. She is otherwise in good condition and will d/c to snf today. If problems voiding, may need foley placement till seen by Odom(she has not been enthusiastic about foley placement). i have kept her on 5 more days of low dose lovenox, mindful of anemia.   Medication List  As of 12/04/2011  1:37 PM   STOP taking these medications         temazepam 15 MG capsule         TAKE these medications         acetaminophen 325 MG tablet   Commonly known as: TYLENOL   Take 650 mg by mouth 2  (two) times daily.      ALPRAZolam 0.25 MG tablet   Commonly known as: XANAX   Take 1 tablet (0.25 mg total) by mouth at bedtime as needed. For sleep      calcium gluconate 500 MG tablet   Take 500 mg by mouth daily.      docusate sodium 100 MG capsule   Commonly known as: COLACE   Take 100 mg by mouth 2 (two) times daily.      enoxaparin 30 MG/0.3ML injection   Commonly known as: LOVENOX   Inject 0.3 mLs (30 mg total) into the skin daily.      estradiol 0.5 MG tablet   Commonly known as: ESTRACE   Take 0.5 mg by mouth daily.      fish oil-omega-3 fatty acids 1000 MG capsule   Take 2 g by mouth daily.      folic acid 1 MG tablet   Commonly known as: FOLVITE   Take 1 mg by mouth daily.      HYDROcodone-acetaminophen 5-325 MG per tablet   Commonly known as: NORCO   Take 1-2 tablets by mouth Odom 4 (four) hours as needed.      levofloxacin 500 MG tablet   Commonly known as: LEVAQUIN   Take 1 tablet (500 mg total) by mouth daily.      losartan 50 MG tablet   Commonly known as: COZAAR   Take 50 mg by mouth daily.      methocarbamol 500 MG  tablet   Commonly known as: ROBAXIN   Take 1 tablet (500 mg total) by mouth Odom 6 (six) hours as needed.      mirtazapine 15 MG tablet   Commonly known as: REMERON   Take 15 mg by mouth at bedtime.      multivitamin tablet   Take 1 tablet by mouth daily.      pantoprazole 40 MG tablet   Commonly known as: PROTONIX   Take 40 mg by mouth 2 (two) times daily.      simethicone 125 MG chewable tablet   Commonly known as: MYLICON   Chew 125 mg by mouth Odom 6 (six) hours as needed. For gas relieve      Tamsulosin HCl 0.4 MG Caps   Commonly known as: FLOMAX   Take 1 capsule (0.4 mg total) by mouth once.      triamterene-hydrochlorothiazide 37.5-25 MG per capsule   Commonly known as: DYAZIDE   Take 1 capsule by mouth daily as needed. For blood pressure             Day of Discharge BP 146/73  Pulse 69  Temp(Src) 98.4  F (36.9 C) (Oral)  Resp 18  Ht 5\' 3"  (1.6 m)  Wt 63.8 kg (140 lb 10.5 oz)  BMI 24.92 kg/m2  SpO2 97%  Physical Exam: Not in distress.  Results for orders placed during the hospital encounter of 11/25/11 (from the past 24 hour(Odom))  CBC     Status: Abnormal   Collection Time   12/04/11  7:13 AM      Component Value Range   WBC 9.8  4.0 - 10.5 (K/uL)   RBC 3.26 (*) 3.87 - 5.11 (MIL/uL)   Hemoglobin 10.0 (*) 12.0 - 15.0 (g/dL)   HCT 09.8 (*) 11.9 - 46.0 (%)   MCV 92.9  78.0 - 100.0 (fL)   MCH 30.7  26.0 - 34.0 (pg)   MCHC 33.0  30.0 - 36.0 (g/dL)   RDW 14.7  82.9 - 56.2 (%)   Platelets 262  150 - 400 (K/uL)  BASIC METABOLIC PANEL     Status: Abnormal   Collection Time   12/04/11  7:13 AM      Component Value Range   Sodium 142  135 - 145 (mEq/L)   Potassium 4.1  3.5 - 5.1 (mEq/L)   Chloride 108  96 - 112 (mEq/L)   CO2 27  19 - 32 (mEq/L)   Glucose, Bld 119 (*) 70 - 99 (mg/dL)   BUN 21  6 - 23 (mg/dL)   Creatinine, Ser 1.30  0.50 - 1.10 (mg/dL)   Calcium 9.1  8.4 - 86.5 (mg/dL)   GFR calc non Af Amer 60 (*) >90 (mL/min)   GFR calc Af Amer 69 (*) >90 (mL/min)    Disposition: to snf today.   Follow-up Appts: Discharge Orders    Future Orders Please Complete By Expires   Diet - low sodium heart healthy      Increase activity slowly         Follow-up Information    Follow up with [facility], MD. Call in 1 week.   Contact information:   [address] [name] [place] [address] [phone]       Follow up with Diane Docker, MD. Call in 2 weeks.   Contact information:   [facility] [address], [address] [place] [address] [phone]  Tests Needing Follow-up: Per Odom/ortho.  Time spent in discharge (includes decision making & examination of pt): 40 minutes  [name] 12/04/2011, 1:37 PM

## 2011-12-04 NOTE — Progress Notes (Signed)
Physical Therapy Treatment Patient Details Name: [name] MRN: [id number] DOB: [birth year] Today's Date: 12/04/2011 Time: 4098-1191 PT Time Calculation (min): 17 min  PT Assessment / Plan / Recommendation Comments on Treatment Session  Pt moving well.  Goals have been met & updated at this time.  Initiated stair training this session incase pt goes directly home.  Pt did really well with stairs.  Per Progress notes, pt is progressing well & mobilizing at a level to high for CIR so now they are discussing home vs SNF.    D/C plans updated.      Follow Up Recommendations  Home health PT;Supervision/Assistance - 24 hour;Skilled nursing facility    Barriers to Discharge        Equipment Recommendations  Rolling walker with 5" wheels;3 in 1 bedside comode    Recommendations for Other Services    Frequency Min 3X/week   Plan Discharge plan needs to be updated    Precautions / Restrictions Precautions Precautions: Posterior Hip;Fall Precaution Comments: Pt required min cueing to verbalize all 3 hip precautions at beginning of session but then she was able to recall them independently at end of session Required Braces or Orthoses: Knee Immobilizer - Left Knee Immobilizer - Left: Other (comment) (needs it on in bed when sleeping) Restrictions Weight Bearing Restrictions: Yes LLE Weight Bearing: Partial weight bearing LLE Partial Weight Bearing Percentage or Pounds: 50       Mobility  Bed Mobility Bed Mobility: Not assessed Transfers Transfers: Sit to Stand;Stand to Sit Sit to Stand: 4: Min assist;4: Min guard;With armrests;With upper extremity assist;From chair/3-in-1;Other (comment) (from ortho gym mat table) Stand to Sit: 4: Min assist;To chair/3-in-1;With armrests;With upper extremity assist;Other (comment) (ortho gym mat table) Details for Transfer Assistance: (A) to initiate standing but pt able to complete standing without (A).  (A) to control descent.  Cues for LLE  positioning to ensure adherence to hip precautions Ambulation/Gait Ambulation/Gait Assistance: 5: Supervision Ambulation Distance (Feet): 160 Feet Assistive device: Rolling walker Ambulation/Gait Assistance Details: Cues for increased upright posture & to look up.   Gait Pattern: Step-through pattern;Decreased step length - right;Decreased stance time - left Stairs: Yes Stairs Assistance: 4: Min guard Stair Management Technique: Two rails;Step to pattern;Forwards Number of Stairs: 4  Wheelchair Mobility Wheelchair Mobility: No    Exercises     PT Diagnosis:    PT Problem List:   PT Treatment Interventions:     PT Goals Acute Rehab PT Goals Pt will go Sit to Stand: with modified independence PT Goal: Sit to Stand - Progress: Updated due to goal met Pt will Transfer Bed to Chair/Chair to Bed: with modified independence PT Transfer Goal: Bed to Chair/Chair to Bed - Progress: Updated due to goal met Pt will Ambulate: 51 - 150 feet;with modified independence;with least restrictive assistive device PT Goal: Ambulate - Progress: Updated due to goal met Pt will Go Up / Down Stairs: 3-5 stairs;with supervision;with least restrictive assistive device;with rail(s) PT Goal: Up/Down Stairs - Progress: Goal set today  Visit Information  Last PT Received On: 12/04/11 Assistance Needed: +1    Subjective Data      Cognition  Overall Cognitive Status: Appears within functional limits for tasks assessed/performed Arousal/Alertness: Awake/alert Orientation Level: Oriented X4 / Intact Behavior During Session: [facility] for tasks performed    Balance     End of Session PT - End of Session Equipment Utilized During Treatment: Gait belt Activity Tolerance: Patient tolerated treatment well Patient left: in chair;with call  bell/phone within reach Nurse Communication: Mobility status    Lara Mulch 12/04/2011, 8:22 AM  Verdell Face, PTA [phone] 12/04/2011

## 2011-12-04 NOTE — Progress Notes (Signed)
Agree with updated d/c plan.  12/04/2011 Cephus Shelling, PT, DPT [phone]

## 2011-12-04 NOTE — Progress Notes (Signed)
Pt able to void  350cc this AM. Bladder scanned performed post void. Scan showed 134cc urine. Will continue to monitor output.

## 2011-12-04 NOTE — Progress Notes (Signed)
Pt and pt's dtr have accepted bed at [facility]. Awaiting call from [facility] CM with auth #. Anticipate d/c today. FL2 on pt's shadow chart in need of MD signature.  Dellie Burns, MSW, LCSWA [phone] (ortho coverage)

## 2011-12-04 NOTE — Progress Notes (Signed)
Occupational Therapy Treatment Patient Details Name: [name] MRN: [id number] DOB: Jan 28, 1918 Today's Date: 12/04/2011 Time: 4098-1191 OT Time Calculation (min): 11 min  OT Assessment / Plan / Recommendation Comments on Treatment Session Pt. progressing very well this session.    Follow Up Recommendations  Skilled nursing facility;Supervision - Intermittent;Home health OT       Equipment Recommendations  Rolling walker with 5" wheels;3 in 1 bedside comode       Frequency Min 2X/week   Plan Discharge plan remains appropriate    Precautions / Restrictions Precautions Precautions: Posterior Hip;Fall Precaution Comments: Pt. able to recall 3/3 hip precautions Required Braces or Orthoses: Knee Immobilizer - Left Knee Immobilizer - Left: Other (comment) (While in bed sleeping) Restrictions Weight Bearing Restrictions: Yes LLE Weight Bearing: Partial weight bearing LLE Partial Weight Bearing Percentage or Pounds: 50   Pertinent Vitals/Pain 3/10 left LE    ADL  Lower Body Bathing: Simulated;Minimal assistance Where Assessed - Lower Body Bathing: Sitting, chair (with long handled sponge) Lower Body Dressing: Performed;Minimal assistance Where Assessed - Lower Body Dressing: Sitting, chair (with reacher and sock aid) Toilet Transfer: Simulated;Minimal assistance Toilet Transfer Method: Stand pivot Toilet Transfer Equipment: Other (comment) (recliner) Ambulation Related to ADLs: NA ADL Comments: Pt. provided with education on LB ADLs with use of AE to complete. Pt. also provided with demonstration and education on technique for completing shower transfer with use of RW and sequencing for increased safety and maintaining PWB on left LE.      OT Goals Acute Rehab OT Goals OT Goal Formulation: With patient Time For Goal Achievement: 12/15/11 Potential to Achieve Goals: Good ADL Goals Pt Will Perform Lower Body Bathing: with mod assist;Sitting, edge of bed;Sit to stand from  bed;with adaptive equipment ADL Goal: Lower Body Bathing - Progress: Met Pt Will Perform Lower Body Dressing: with min assist;with adaptive equipment;Sitting, bed;Sit to stand from bed ADL Goal: Lower Body Dressing - Progress: Met Pt Will Transfer to Toilet: with mod assist;3-in-1;Maintaining weight bearing status ADL Goal: Toilet Transfer - Progress: Met  Visit Information  Last OT Received On: 12/04/11 Assistance Needed: +1          Cognition  Overall Cognitive Status: Appears within functional limits for tasks assessed/performed Arousal/Alertness: Awake/alert Orientation Level: Oriented X4 / Intact Behavior During Session: [facility] for tasks performed    Mobility Bed Mobility Bed Mobility: Not assessed Transfers Sit to Stand: 4: Min guard;With upper extremity assist;With armrests;From chair/3-in-1 Sit to Stand: Patient Percentage: 30% Stand to Sit: 4: Min assist;To chair/3-in-1;With armrests;With upper extremity assist;Other (comment) Stand to Sit: Patient Percentage: 30% Details for Transfer Assistance: (A) to initiate standing but pt able to complete standing without (A).  (A) to control descent.  Cues for LLE positioning to ensure adherence to hip precautions         End of Session OT - End of Session Equipment Utilized During Treatment: Gait belt;Left knee immobilizer Activity Tolerance: Patient tolerated treatment well Patient left: in chair;with call bell/phone within reach   Challis Crill, OTR/L Pager [phone] 12/04/2011, 10:41 AM

## 2011-12-18 ENCOUNTER — Emergency Department (HOSPITAL_COMMUNITY)
Admission: EM | Admit: 2011-12-18 | Discharge: 2011-12-19 | Disposition: A | Discharge status: Home / Self Care | Payer: Medicare Other | Attending: Emergency Medicine | Admitting: Emergency Medicine

## 2011-12-18 ENCOUNTER — Encounter (HOSPITAL_COMMUNITY): Payer: Self-pay

## 2011-12-18 DIAGNOSIS — Z79899 Other long term (current) drug therapy: Secondary | ICD-10-CM | POA: Insufficient documentation

## 2011-12-18 DIAGNOSIS — R319 Hematuria, unspecified: Secondary | ICD-10-CM | POA: Insufficient documentation

## 2011-12-18 DIAGNOSIS — E871 Hypo-osmolality and hyponatremia: Secondary | ICD-10-CM

## 2011-12-18 DIAGNOSIS — Z8673 Personal history of transient ischemic attack (TIA), and cerebral infarction without residual deficits: Secondary | ICD-10-CM | POA: Insufficient documentation

## 2011-12-18 DIAGNOSIS — I2510 Atherosclerotic heart disease of native coronary artery without angina pectoris: Secondary | ICD-10-CM | POA: Insufficient documentation

## 2011-12-18 DIAGNOSIS — M810 Age-related osteoporosis without current pathological fracture: Secondary | ICD-10-CM | POA: Insufficient documentation

## 2011-12-18 LAB — URINALYSIS, ROUTINE W REFLEX MICROSCOPIC
Glucose, UA: NEGATIVE mg/dL
Ketones, ur: NEGATIVE mg/dL
Protein, ur: 100 mg/dL — AB

## 2011-12-18 LAB — POCT I-STAT, CHEM 8
HCT: 37 % (ref 36.0–46.0)
Hemoglobin: 12.6 g/dL (ref 12.0–15.0)
Potassium: 4.4 mEq/L (ref 3.5–5.1)
Sodium: 128 mEq/L — ABNORMAL LOW (ref 135–145)

## 2011-12-18 LAB — CBC
Hemoglobin: 11.8 g/dL — ABNORMAL LOW (ref 12.0–15.0)
MCH: 30.7 pg (ref 26.0–34.0)
MCHC: 33.4 g/dL (ref 30.0–36.0)

## 2011-12-18 LAB — URINE MICROSCOPIC-ADD ON

## 2011-12-18 MED ORDER — SODIUM CHLORIDE 0.9 % IV BOLUS (SEPSIS)
500.0000 mL | Freq: Once | INTRAVENOUS | Status: AC
  Administered 2011-12-18: 500 mL via INTRAVENOUS

## 2011-12-18 NOTE — ED Notes (Signed)
Pt had hip surgery 3 weeks ago, pt then in rehab and foley placed, today the bag in full of blood in her foley, pt complains of no pain

## 2011-12-18 NOTE — ED Provider Notes (Signed)
History     CSN: [id number]  Arrival date & time 12/18/11  4098   First MD Initiated Contact with Patient 12/18/11 2050      Chief Complaint  Patient presents with  . Hematuria    HPI Pt had a foley catheter placed two weeks ago for urinary retention.  She was in a nursing home and was recently released.  Pt still has the catheter in place and is scheduled to see the urologist tomorrow.  Family noticed blood in the urine today so she was sent to the ED.   No fever, no vomiting.    Past Medical History  Diagnosis Date  . CAD (coronary artery disease)   . GERD (gastroesophageal reflux disease)   . Hypertension   . Diverticulosis   . Hyperlipidemia   . Gastric peptic ulcer   . Allergy   . Anxiety   . Arthritis   . Blood transfusion     when had my hip replaced  . Cataract     removed bil eyes  . Depression   . Osteoporosis   . Stroke     TIA  . Irregular heart beat     Past Surgical History  Procedure Date  . Stomach ulcer   . Replacement total hip w/  resurfacing implants     right hip  . Coronary stent placement   . Kidney stent   . Tonsillectomy   . Bil cataracts removed   . Colonoscopy   . Joint replacement   . Hip arthroplasty 11/26/2011    Procedure: ARTHROPLASTY BIPOLAR HIP;  Surgeon: Javier Docker, MD;  Location: [facility] OR;  Service: Orthopedics;  Laterality: Left;    Family History  Problem Relation Age of Onset  . Uterine cancer Sister   . Heart disease Sister     and brother  . Diabetes Son   . Esophageal cancer Neg Hx   . Colon cancer Neg Hx   . Stomach cancer Neg Hx   . Hypertension Mother   . Hypertension Father     History  Substance Use Topics  . Smoking status: Never Smoker   . Smokeless tobacco: Never Used  . Alcohol Use: No    OB History    Grav Para Term Preterm Abortions TAB SAB Ect Mult Living                  Review of Systems  All other systems reviewed and are negative.    Allergies  Review of patient's allergies  indicates no known allergies.  Home Medications   Current Outpatient Rx  Name Route Sig Dispense Refill  . ACETAMINOPHEN 325 MG PO TABS Oral Take 650 mg by mouth 2 (two) times daily.    Marland Kitchen ALPRAZOLAM 0.25 MG PO TABS Oral Take 1 tablet (0.25 mg total) by mouth at bedtime as needed. For sleep 20 tablet 0  . CALCIUM GLUCONATE 500 MG PO TABS Oral Take 500 mg by mouth daily.      Marland Kitchen DOCUSATE SODIUM 100 MG PO CAPS Oral Take 100 mg by mouth 2 (two) times daily.      Marland Kitchen ENOXAPARIN SODIUM 30 MG/0.3ML [place] SOLN Subcutaneous Inject 0.3 mLs (30 mg total) into the skin daily. 5 Syringe 0  . ESTRADIOL 0.5 MG PO TABS Oral Take 0.5 mg by mouth daily.      . OMEGA-3 FATTY ACIDS 1000 MG PO CAPS Oral Take 2 g by mouth daily.      Marland Kitchen FOLIC  ACID 1 MG PO TABS Oral Take 1 mg by mouth daily.      Marland Kitchen LOSARTAN POTASSIUM 50 MG PO TABS Oral Take 50 mg by mouth daily.      Marland Kitchen MIRTAZAPINE 15 MG PO TABS Oral Take 15 mg by mouth at bedtime.      Marland Kitchen ONE-DAILY MULTI VITAMINS PO TABS Oral Take 1 tablet by mouth daily.      Marland Kitchen PANTOPRAZOLE SODIUM 40 MG PO TBEC Oral Take 40 mg by mouth 2 (two) times daily.      Marland Kitchen SIMETHICONE 125 MG PO CHEW Oral Chew 125 mg by mouth every 6 (six) hours as needed. For gas relieve    . TAMSULOSIN HCL 0.4 MG PO CAPS Oral Take 1 capsule (0.4 mg total) by mouth once. 15 capsule 0  . TRIAMTERENE-HCTZ 37.5-25 MG PO CAPS Oral Take 1 capsule by mouth daily as needed. For blood pressure      BP 158/54  Pulse 90  Temp(Src) 98.7 F (37.1 C) (Oral)  Resp 20  SpO2 97%  Physical Exam  Nursing note and vitals reviewed. Constitutional: She appears well-developed and well-nourished. No distress.  HENT:  Head: Normocephalic and atraumatic.  Right Ear: External ear normal.  Left Ear: External ear normal.  Eyes: Conjunctivae are normal. Right eye exhibits no discharge. Left eye exhibits no discharge. No scleral icterus.  Neck: Neck supple. No tracheal deviation present.  Cardiovascular: Normal rate, regular  rhythm and intact distal pulses.   Pulmonary/Chest: Effort normal and breath sounds normal. No stridor. No respiratory distress. She has no wheezes. She has no rales.  Abdominal: Soft. Bowel sounds are normal. She exhibits no distension. There is no tenderness. There is no rebound and no guarding.  Musculoskeletal: She exhibits no edema and no tenderness.  Neurological: She is alert. She has normal strength. No sensory deficit. Cranial nerve deficit:  no gross defecits noted. She exhibits normal muscle tone. She displays no seizure activity. Coordination normal.  Skin: Skin is warm and dry. No rash noted.  Psychiatric: She has a normal mood and affect.    ED Course  Procedures (including critical care time)  Labs Reviewed  CBC - Abnormal; Notable for the following:    RBC 3.84 (*)    Hemoglobin 11.8 (*)    HCT 35.3 (*)    All other components within normal limits  URINALYSIS, ROUTINE W REFLEX MICROSCOPIC - Abnormal; Notable for the following:    Hgb urine dipstick LARGE (*)    Protein, ur 100 (*)    Leukocytes, UA MODERATE (*)    All other components within normal limits  POCT I-STAT, CHEM 8 - Abnormal; Notable for the following:    Sodium 128 (*)    BUN 30 (*)    Creatinine, Ser 1.50 (*)    Glucose, Bld 121 (*)    Calcium, Ion 0.93 (*)    All other components within normal limits  URINE MICROSCOPIC-ADD ON  URINE CULTURE   No results found.   1. Hematuria   2. Hyponatremia       MDM  Patient has hematuria but no evidence of urinary tract infection. I will send off a urine culture however. Patient does have hyponatremia most likely related to her blood pressure medications. Do not feel that she is symptomatic at this time. She has been given IV fluids. This time I do not feel there is evidence of an acute emergency medical condition. Patient be discharged home. Sure he has followup  with urologist scheduled        Celene Kras, MD 12/18/11 2316

## 2011-12-18 NOTE — Discharge Instructions (Signed)
Hematuria, Adult Hematuria (blood in your urine) can be caused by a bladder infection (cystitis), kidney infection (pyelonephritis), prostate infection (prostatitis), or kidney stone. Infections will usually respond to antibiotics (medications which kill germs), and a kidney stone will usually pass through your urine without further treatment. If you were put on antibiotics, take all the medicine until gone. You may feel better in a few days, but take all of your medicine or the infection may not respond and become more difficult to treat. If antibiotics were not given, an infection did not cause the blood in the urine. A further work up to find out the reason may be needed. HOME CARE INSTRUCTIONS   Drink lots of fluid, 3 to 4 quarts a day. If you have been diagnosed with an infection, cranberry juice is especially recommended, in addition to large amounts of water.   Avoid caffeine, tea, and carbonated beverages, because they tend to irritate the bladder.   Avoid alcohol as it may irritate the prostate.   Only take over-the-counter or prescription medicines for pain, discomfort, or fever as directed by your caregiver.   If you have been diagnosed with a kidney stone follow your caregivers instructions regarding straining your urine to catch the stone.  TO PREVENT FURTHER INFECTIONS:  Empty the bladder often. Avoid holding urine for long periods of time.   After a bowel movement, women should cleanse front to back. Use each tissue only once.   Empty the bladder before and after sexual intercourse if you are a female.   Return to your caregiver if you develop back pain, fever, nausea (feeling sick to your stomach), vomiting, or your symptoms (problems) are not better in 3 days. Return sooner if you are getting worse.  If you have been requested to return for further testing make sure to keep your appointments. If an infection is not the cause of blood in your urine, X-rays may be required. Your  caregiver will discuss this with you. SEEK IMMEDIATE MEDICAL CARE IF:   You have a persistent fever over 102 F (38.9 C).   You develop severe vomiting and are unable to keep the medication down.   You develop severe back or abdominal pain despite taking your medications.   You begin passing a large amount of blood or clots in your urine.   You feel extremely weak or faint, or pass out.  MAKE SURE YOU:   Understand these instructions.   Will watch your condition.   Will get help right away if you are not doing well or get worse.  Document Released: 07/09/2005 Document Revised: 06/28/2011 Document Reviewed: 02/26/2008 [facility] Patient Information 2012 [place], [place].Hyponatremia  Hyponatremia is when the amount of salt (sodium) in your blood is too low. When sodium levels are low, your cells will absorb extra water and swell. The swelling happens throughout the body, but it mostly affects the brain. Severe brain swelling (cerebral edema), seizures, or coma can happen.  CAUSES   Heart, kidney, or liver problems.   Thyroid problems.   Adrenal gland problems.   Severe vomiting and diarrhea.   Certain medicines or illegal drugs.   Dehydration.   Drinking too much water.   Low-sodium diet.  SYMPTOMS   Nausea and vomiting.   Confusion.   Lethargy.   Agitation.   Headache.   Twitching or shaking (seizures).   Unconsciousness.   Appetite loss.   Muscle weakness and cramping.  DIAGNOSIS  Hyponatremia is identified by a simple blood test.  Your caregiver will perform a history and physical exam to try to find the cause and type of hyponatremia. Other tests may be needed to measure the amount of sodium in your blood and urine. TREATMENT  Treatment will depend on the cause.   Fluids may be given through the vein (IV).   Medicines may be used to correct the sodium imbalance. If medicines are causing the problem, they will need to be adjusted.   Water or fluid  intake may be restricted to restore proper balance.  The speed of correcting the sodium problem is very important. If the problem is corrected too fast, nerve damage (sometimes unchangeable) can happen. HOME CARE INSTRUCTIONS   Only take medicines as directed by your caregiver. Many medicines can make hyponatremia worse. Discuss all your medicines with your caregiver.   Carefully follow any recommended diet, including any fluid restrictions.   You may be asked to repeat lab tests. Follow these directions.   Avoid alcohol and recreational drugs.  SEEK MEDICAL CARE IF:   You develop worsening nausea, fatigue, headache, confusion, or weakness.   Your original hyponatremia symptoms return.   You have problems following the recommended diet.  SEEK IMMEDIATE MEDICAL CARE IF:   You have a seizure.   You faint.   You have ongoing diarrhea or vomiting.  MAKE SURE YOU:   Understand these instructions.   Will watch your condition.   Will get help right away if you are not doing well or get worse.  Document Released: 06/29/2002 Document Revised: 06/28/2011 Document Reviewed: 12/24/2010 [facility] Patient Information 2012 [place], [place].

## 2011-12-18 NOTE — ED Notes (Signed)
Bladder irrigated with good urine and irrigate return very small clots noted out of foley. Pt tolerated well.

## 2011-12-19 NOTE — ED Notes (Signed)
Nadn.  Pt wheeled to private vehicle.  Pt daughter at bedside.  Pt/daughter verbal;izes understanding

## 2011-12-23 LAB — URINE CULTURE
Colony Count: >=100000
Culture  Setup Time: 201305290229

## 2011-12-24 NOTE — ED Notes (Signed)
+   Urine Chart sent to EDP office for review. 

## 2011-12-24 NOTE — ED Notes (Signed)
Patient is inpatient at [facility].

## 2013-04-21 ENCOUNTER — Encounter: Payer: Self-pay | Admitting: Cardiovascular Disease

## 2013-10-21 DIAGNOSIS — I480 Paroxysmal atrial fibrillation: Secondary | ICD-10-CM

## 2013-10-21 HISTORY — DX: Paroxysmal atrial fibrillation: I48.0

## 2013-11-09 ENCOUNTER — Telehealth: Payer: Self-pay | Admitting: *Deleted

## 2013-11-09 NOTE — Telephone Encounter (Signed)
Will call on Wednesday.

## 2013-11-09 NOTE — Telephone Encounter (Signed)
[name] called from Dr. Holley Boucheandall Odom in regards to this patient. Dr. Tiburcio Odom would like to speak to you about this patient being in Afib. Pt saw RAW last and is seeing you on April 29th. Dr. Tiburcio Odom would like to review her case with you prior to her appointment. [phone].

## 2013-11-10 ENCOUNTER — Encounter: Payer: Self-pay | Admitting: *Deleted

## 2013-11-11 ENCOUNTER — Encounter: Payer: Self-pay | Admitting: Cardiology

## 2013-11-11 NOTE — Telephone Encounter (Signed)
Contacted by the patient's PCP who lives wanted to let me know the patient had presented with mild dyspnea and was found to be in rate-controlled A. fib in his office. She does not appear to be overly symptomatic. Based on her plans, we will likely consider rate control, and probably would not do additional anticoagulation beyond Plavix, as she has had a history of falls.  Marykay Lexavid W Harding, MD

## 2013-11-18 ENCOUNTER — Encounter: Payer: Self-pay | Admitting: Cardiology

## 2013-11-18 ENCOUNTER — Ambulatory Visit (INDEPENDENT_AMBULATORY_CARE_PROVIDER_SITE_OTHER): Payer: Medicare HMO | Admitting: Cardiology

## 2013-11-18 VITALS — BP 124/62 | HR 62 | Ht 62.0 in | Wt 137.0 lb

## 2013-11-18 DIAGNOSIS — I2510 Atherosclerotic heart disease of native coronary artery without angina pectoris: Secondary | ICD-10-CM

## 2013-11-18 DIAGNOSIS — R0609 Other forms of dyspnea: Secondary | ICD-10-CM

## 2013-11-18 DIAGNOSIS — I4891 Unspecified atrial fibrillation: Secondary | ICD-10-CM

## 2013-11-18 DIAGNOSIS — I701 Atherosclerosis of renal artery: Secondary | ICD-10-CM

## 2013-11-18 DIAGNOSIS — I10 Essential (primary) hypertension: Secondary | ICD-10-CM

## 2013-11-18 DIAGNOSIS — E785 Hyperlipidemia, unspecified: Secondary | ICD-10-CM

## 2013-11-18 DIAGNOSIS — Z9861 Coronary angioplasty status: Secondary | ICD-10-CM

## 2013-11-18 DIAGNOSIS — R0989 Other specified symptoms and signs involving the circulatory and respiratory systems: Secondary | ICD-10-CM

## 2013-11-18 NOTE — Telephone Encounter (Signed)
Dr harding saw patient today in clinic

## 2013-11-18 NOTE — Patient Instructions (Signed)
Your physician recommends that you schedule a follow-up appointment in: 3-4 weeks  Your physician has requested that you have an echocardiogram. Echocardiography is a painless test that uses sound waves to create images of your heart. It provides your doctor with information about the size and shape of your heart and how well your heart's chambers and valves are working. This procedure takes approximately one hour. There are no restrictions for this procedure.

## 2013-11-18 NOTE — Progress Notes (Signed)
PATIENT: [name] MRN: [id number] DOB: [birth year] PCP: [name], WILLIAM, MD  Clinic Note: Chief Complaint  Patient presents with  . Follow-up    [facility], pt c/o of pain in her hip    HPI: Diane GuestLillie Odom is a 80 y.o. female with a PMH below who presents today for establishment of new cardiology care at the time Dr. Alanda AmassWeintraub. She is the patient has for several years. This is her first visit with a new cardiologist. She has a history of known two-vessel coronary disease status post PCI of LAD and RCA with PTCA of the diagonal, history of right renal artery stenosis status post stenting. She is on long-term Plavix. Her last heart catheterization was in 2010 showing patent stents as well as patent RCA stent and maybe 30% ISR. She echocardiogram in 2013 showing normal EF with grade 2 diastolic dysfunction, and mildly elevated pulmonary pressures. She last saw Dr. Alanda AmassWeintraub in September 2014, at that time she is doing quite well with no major complaints.  Interval History: She presents today with no major cardiac concerns, but was told during her recent PCP visit that she has atrial fibrillation. I talked with her PCP, Dr. Tiburcio PeaHarris about this subject. He felt that the patient has a relatively high fall risk, and felt that we should opt for simply using Plavix for stroke prevention. He did not think it a significant aggressive evaluation at the performed.  Her major concern today is associated with significant pain in her left hip going down to her legs. His really limiting her activity over last few weeks. This was not the case when she saw Dr. Tiburcio PeaHarris. She is eagerly awaiting going to see Dr. Lequita HaltAluisio a couple weeks. She is expecting to see may need a redo surgery. This makes the new diagnosis of atrial fibrillation more concerning.  From a cardiac standpoint the main thing she notes is that she has been feeling the sensation of some mild exertional dyspnea. She feels dizzy on  occasion but has not had any significant falls as a result of feeling of dizziness. No syncope or near-syncope, TIA or amaurosis fugax symptoms. While she has noted some irregular sensations in her chest, she really doesn't note anything overly concerning for her. She denies any chest tightness or pressure with rest or exertion, but over last couple weeks has not really been over to exert herself very much. Prior to that she was able to do what I recommend she wanted to do including washing her daughter's dirty dishes.  She denies any melena, hematochezia or hematuria. No claudication.  Past Medical History  Diagnosis Date  . CAD S/P percutaneous coronary angioplasty 2004    ZETA BMS 4.0 mm X 15 mm in RCA - 2004; CYPHER DES 2.565mm x 18 mm to LAD (with PTCA of jailed D2 with ostial 85%)& 2 in 2007  . TIA (transient ischemic attack)   . Renal artery stenosis, native 2003-2004    S/P R Renal A Stent 5 mm x 12 mm  . Hypertension     H/o Renovascular HTN, s/p R RA Stent in 06/2003  . Hyperlipidemia   . Diverticulosis   . GERD (gastroesophageal reflux disease)     TIA  . PUD (peptic ulcer disease)     Gastric Ulcer  . Gastric outlet obstruction 2012    remote -- s/p Vagotomy & Pyeloroplasty  . PAF (paroxysmal atrial fibrillation) April 2015    Newly diagnosed by PCP  .  Multiple allergies   . Anxiety   . Depression   . History of blood transfusion     With Hip Surgery  . Cataract     removed bil eyes  . Osteoporosis     s/p L hip Fxr - Hemiarthroplasty  . Arthritis     R Hip total Arthroplasty & L Hip Hemiarthroplasty    Prior Cardiac Evaluation and Past Surgical History: Past Surgical History  Procedure Laterality Date  . Vagotomy and pyloroplasty  01/13/04  . Replacement total hip w/  resurfacing implants Right 04/2002  . Coronary stent placement    . Renal artery stent  07/22/2003    Cordis 5x3012mm stent to right renal artery (Dr. Jonette Eva. Weintraub)  . Tonsillectomy    . Cataract  extraction Bilateral   . Colonoscopy    . Joint replacement    . Hip arthroplasty  11/26/2011    Procedure: ARTHROPLASTY BIPOLAR HIP;  Surgeon: Javier DockerJeffrey C Beane, MD;  Location: [facility] OR;  Service: Orthopedics;  Laterality: Left;  . Transthoracic echocardiogram  11/2011    EF 60-65%, grade 2 diastolic dysfunction; calcified MV annulus; LA mildly dilated; PA pressure increased to 39mmH  . Nm myocar perf wall motion  08/2007    persantine myoview - normal pattern of perfusion, EF 80%, low risk scan  . Cardiac catheterization  01/17/2009    normal L main, normal LAD, normal L Cfx, RCA with patent stent (Dr. Erlene QuanJ. Berry)  . Percutaneous coronary stent intervention (pci-s)  06/24/2003    ACS Zeta BMS 4.0x8515mm to RCA (Dr. Jonette Eva. Weintraub)  . Percutaneous coronary stent intervention (pci-s)  11/13/2005    Cypher DES 2.5x3418mm to LAD; Cutting PTCA of Ostial D2 (jailed) (Dr. Jonette Eva. Weintraub)   no notable carotid disease on Doppler  No Known Allergies  Current Outpatient Prescriptions  Medication Sig Dispense Refill  . acetaminophen (TYLENOL) 325 MG tablet Take 650 mg by mouth 2 (two) times daily.      Marland Kitchen. ALPRAZolam (XANAX) 0.25 MG tablet Take 1 tablet (0.25 mg total) by mouth at bedtime as needed. For sleep  20 tablet  0  . calcium gluconate 500 MG tablet Take 500 mg by mouth 2 (two) times daily.       . Cholecalciferol (VITAMIN D-3 PO) Take by mouth daily.      . clopidogrel (PLAVIX) 75 MG tablet Take 75 mg by mouth daily.      Marland Kitchen. docusate sodium (COLACE) 100 MG capsule Take 100 mg by mouth 2 (two) times daily.        . fish oil-omega-3 fatty acids 1000 MG capsule Take 1 g by mouth 2 (two) times daily.       . folic acid (FOLVITE) 1 MG tablet Take 1 mg by mouth daily.        Marland Kitchen. HYDROcodone-acetaminophen (NORCO/VICODIN) 5-325 MG per tablet Take 1 tablet by mouth at bedtime as needed.      Marland Kitchen. losartan (COZAAR) 50 MG tablet Take 50 mg by mouth daily.        . mirtazapine (REMERON) 15 MG tablet Take 15 mg by mouth at  bedtime.        . Multiple Vitamin (MULITIVITAMIN WITH MINERALS) TABS Take 1 tablet by mouth daily.      . pantoprazole (PROTONIX) 40 MG tablet Take 40 mg by mouth daily.       . temazepam (RESTORIL) 30 MG capsule Take 30 mg by mouth at bedtime.      . triamterene-hydrochlorothiazide (  DYAZIDE) 37.5-25 MG per capsule Take 1 capsule by mouth daily as needed. For fluid.       No current facility-administered medications for this visit.    History   Social History Narrative   She was a former long term patient of Dr. Susa Griffins. He also followed her husband, [name], who died several years ago at age 2, he was followed for a history of aortic valve replacement.   She is therefore a widowed mother of 2, grandmother of at least 3.   She still lives alone, but her daughter lives next door. She gets around quite well using a walker until her recent problems with hip pain.   She is a nonsmoker, never drank alcohol.    Family History:  Diabetes in her son; Heart disease in her brother and sister; Hypertension in her father and mother; Stroke in her father and mother; Uterine cancer in her sister. There is no history of Esophageal cancer, Colon cancer, or Stomach cancer.  ROS: A comprehensive Review of Systems - Negative except Symptoms noted above. She does mild GERD sensation but nothing that's overly concerning while she's on pantoprazole; mild in the day swelling. She also has some occasional hot pins and needles sensation in her feet.  PHYSICAL EXAM BP 124/62  Pulse 62  Ht 5\' 2"  (1.575 m)  Wt 137 lb (62.143 kg)  BMI 25.05 kg/m2 General appearance: alert, cooperative, appears stated age, no distress and Very pleasant mood and affect. She is significant thoracic kyphoscoliosis; she is mildly frail, but vibrant mentally. HEENT: [place]/AT, EOMI, MMM, anicteric sclera - with post-cataract surgical findings Neck: no adenopathy, no carotid bruit, no JVD, supple, symmetrical, trachea midline and  thyroid not enlarged, symmetric, no tenderness/mass/nodules Lungs: clear to auscultation bilaterally, normal percussion bilaterally and Nonlabored, good air movement Heart: RRR, normal S1 with a soft S4. Nondisplaced PMI. 1-2/6 early peaking C-D SEM at RUSB radiating to carotids. No other rubs or gallops Abdomen: soft, non-tender; bowel sounds normal; no masses,  no organomegaly Extremities: extremities normal, atraumatic, no cyanosis or edema and Minimal, small caliber varicosities; no signs of venous stasis dermatitis. Pulses: 2+ and symmetric Neurologic: Mental status: Alert, oriented, thought content appropriate, alertness: alert, orientation: X4, affect: normal and Pleasant Cranial nerves: normal Motor: grossly normal Gait: Antalgic favors left hip   Adult ECG Report  Rate: 62 ;  Rhythm: atrial fibrillation and Controlled ventricular rate (appears to be coarse A. fib)  QRS Axis: 65 ;  PR Interval: * ;  QRS Duration: 94 ; QTc: 387;  Voltages: Normal  Conduction Disturbances: none  Other Abnormalities: Inferior Q waves in 2, 3, aVF cannot exclude inferior MI, age undetermined.   Narrative Interpretation: Since prior EKG, atrial fibrillation is now replaced sinus rhythm. Inferior Q waves are not new  Recent Labs: None recently checked  ASSESSMENT / PLAN: Very pleasant elderly woman who was very sharp mentally, and previously very with exception of the last few weeks in the company of a significant left-sided pain. She is a new diagnosis of atrial fibrillation the essentially has her a symptomatically septum exertional dyspnea. In the absence of potential preoperative evaluation, and my recommendation would be to simply continue with Plavix for stroke dimension based on my discussion with Dr. Tiburcio Pea. He is already rate controlled, so no weight lesions are required. However I will check a 2-D echocardiogram to get a new assessment of her EF, which could Promus additional testing. Next line.  With the thought of possible  repeat hip surgery, or risk stratification now she's having some exertional dyspnea, I would consider a LexiScan Myoview to risk stratify for a possible ischemic cause for her new diagnosis of atrial fibrillation.  If no surgery is planned, I do not think that would notably improve her symptomatically i.e. invasive evaluation with possible stenting. In that case, I would not probably proceed with stress testing as she is now noticing significant symptoms beyond exertional dyspnea which can easily be explained by the nature fibrillation and lack of ability to increase her heart rate.  A-fib New diagnosis of rate-controlled A. fib. I actually more concern for possible bradycardia in this situation. Would not add any type of rate control agent. Per discussion with primary physician but simply use Plavix for anticoagulation.  We'll check 2-D echocardiogram to assess EF and she is noticing some dyspnea. If surgery is indicated for her hip, with new onset A. fib and dyspnea, would consider LexiScan Myoview for risk stratification. She would entertain invasive evaluation with possible stenting if indicated. This is after a long discussion.  Exertional dyspnea Not sure if this is because of her hip pains, or even possibly an ischemic equivalent. It could easily be explained by atrial fibrillation with lack of ability to mount heart rate response.  Plan: 2-D echocardiogram  CAD S/P percutaneous coronary angioplasty: BMS to RCA in 2004; DES to LAD with PTCA of D2 in 2007;  patent in 2010 Patent stents in 2010.  Continues on Plavix. She is on an thank you for the winter rate relatively slow she is not on a beta blocker. She is on fish oil, but not on a statin.  At present, not symptomatic besides exertional dyspnea. For risk verification for hip surgery however we did dyspnea, would consider Myoview, otherwise with her minimal symptoms would simply continue to treat  medically.  HYPERTENSION Well controlled on ARB plus Dyazide.  RENAL ARTERY STENOSIS Her last renal artery duplex was in 2010. As her blood pressure stable, I doubt that there is any significant worsening of this renal artery disease, however it would not be unreasonable to check a screening renal artery Doppler at least every couple years. We will discuss this at followup visit and consider ordering renal artery Dopplers.  HYPERLIPIDEMIA Not on statin. Monitored by PCP. Will defer to PCP.    Orders Placed This Encounter  Procedures  . EKG 12-Lead  . 2D Echocardiogram without contrast    Standing Status: Future     Number of Occurrences:      Standing Expiration Date: 11/19/2014    Order Specific Question:  Type of Echo    Answer:  Complete    Order Specific Question:  Where should this test be performed    Answer:  [facility]    Order Specific Question:  Reason for exam-Echo    Answer:  Atrail Fibrillation  427.31    Order Specific Question:  Reason for exam-Echo    Answer:  Dyspnea  786.09   Meds ordered this encounter  Medications  . HYDROcodone-acetaminophen (NORCO/VICODIN) 5-325 MG per tablet    Sig: Take 1 tablet by mouth at bedtime as needed.    Followup: Roughly 1 month  DAVID W. Herbie Baltimore, M.D., M.S. Interventional Cardiolgy [facility]

## 2013-11-19 DIAGNOSIS — I4821 Permanent atrial fibrillation: Secondary | ICD-10-CM | POA: Insufficient documentation

## 2013-11-19 DIAGNOSIS — R0609 Other forms of dyspnea: Secondary | ICD-10-CM

## 2013-11-19 NOTE — Assessment & Plan Note (Signed)
Not on statin. Monitored by PCP. Will defer to PCP.

## 2013-11-19 NOTE — Assessment & Plan Note (Signed)
Patent stents in 2010.  Continues on Plavix. She is on an thank you for the winter rate relatively slow she is not on a beta blocker. She is on fish oil, but not on a statin.  At present, not symptomatic besides exertional dyspnea. For risk verification for hip surgery however we did dyspnea, would consider Myoview, otherwise with her minimal symptoms would simply continue to treat medically.

## 2013-11-19 NOTE — Assessment & Plan Note (Addendum)
New diagnosis of rate-controlled A. fib. I actually more concern for possible bradycardia in this situation. Would not add any type of rate control agent. Per discussion with primary physician but simply use Plavix for anticoagulation.  We'll check 2-D echocardiogram to assess EF and she is noticing some dyspnea. If surgery is indicated for her hip, with new onset A. fib and dyspnea, would consider LexiScan Myoview for risk stratification. She would entertain invasive evaluation with possible stenting if indicated. This is after a long discussion.

## 2013-11-19 NOTE — Assessment & Plan Note (Signed)
Her last renal artery duplex was in 2010. As her blood pressure stable, I doubt that there is any significant worsening of this renal artery disease, however it would not be unreasonable to check a screening renal artery Doppler at least every couple years. We will discuss this at followup visit and consider ordering renal artery Dopplers.

## 2013-11-19 NOTE — Assessment & Plan Note (Signed)
Not sure if this is because of her hip pains, or even possibly an ischemic equivalent. It could easily be explained by atrial fibrillation with lack of ability to mount heart rate response.  Plan: 2-D echocardiogram

## 2013-11-19 NOTE — Assessment & Plan Note (Signed)
Well controlled on ARB plus Dyazide.

## 2013-11-23 ENCOUNTER — Ambulatory Visit (HOSPITAL_COMMUNITY)
Admission: RE | Admit: 2013-11-23 | Discharge: 2013-11-23 | Disposition: A | Discharge status: Home / Self Care | Payer: Medicare HMO | Source: Ambulatory Visit | Attending: Cardiovascular Disease | Admitting: Cardiovascular Disease

## 2013-11-23 DIAGNOSIS — I4891 Unspecified atrial fibrillation: Secondary | ICD-10-CM | POA: Insufficient documentation

## 2013-11-23 DIAGNOSIS — R0989 Other specified symptoms and signs involving the circulatory and respiratory systems: Secondary | ICD-10-CM | POA: Insufficient documentation

## 2013-11-23 DIAGNOSIS — I059 Rheumatic mitral valve disease, unspecified: Secondary | ICD-10-CM

## 2013-11-23 DIAGNOSIS — R0609 Other forms of dyspnea: Secondary | ICD-10-CM | POA: Insufficient documentation

## 2013-11-23 NOTE — Progress Notes (Signed)
Quick Note:  Echo results: Good news: Essentially normal echocardiogram and normal pump function and normal valve function. No signs to suggest heart attack.. EF: 55-60%. No regional wall motion abnormalities Only mild calcification of a couple valves, but no significant abnormalities.  Marykay Lexavid W Harding, MD     ______

## 2013-11-23 NOTE — Progress Notes (Signed)
2D Echo Performed 11/23/2013    Danisa Kopec, RCS  

## 2013-11-24 ENCOUNTER — Telehealth: Payer: Self-pay | Admitting: *Deleted

## 2013-11-24 NOTE — Telephone Encounter (Signed)
Left message on home phone and cell phone to call back about ECHO result.

## 2013-11-24 NOTE — Telephone Encounter (Signed)
Message copied by Tobin ChadMARTIN, Harli Engelken V. on Tue Nov 24, 2013  4:28 PM ------      Message from: Marykay LexHARDING, DAVID W      Created: Mon Nov 23, 2013  7:58 PM       Echo results:      Good news: Essentially normal echocardiogram and normal pump function and normal valve function.  No signs to suggest heart attack..      EF: 55-60%.      No regional wall motion abnormalities      Only mild calcification of a couple valves, but no significant abnormalities.            Marykay Lexavid W Harding, MD                         ------

## 2013-11-25 NOTE — Telephone Encounter (Signed)
Spoke to Mrs [name]. Results given. Daughter voiced understanding.

## 2013-12-09 ENCOUNTER — Ambulatory Visit: Payer: Medicare HMO | Admitting: Cardiology

## 2013-12-10 ENCOUNTER — Ambulatory Visit (INDEPENDENT_AMBULATORY_CARE_PROVIDER_SITE_OTHER): Payer: Medicare HMO | Admitting: Cardiology

## 2013-12-10 ENCOUNTER — Encounter: Payer: Self-pay | Admitting: Cardiology

## 2013-12-10 VITALS — BP 110/78 | HR 68 | Ht 63.0 in | Wt 132.7 lb

## 2013-12-10 DIAGNOSIS — Z01810 Encounter for preprocedural cardiovascular examination: Secondary | ICD-10-CM

## 2013-12-10 DIAGNOSIS — I2510 Atherosclerotic heart disease of native coronary artery without angina pectoris: Secondary | ICD-10-CM

## 2013-12-10 DIAGNOSIS — E785 Hyperlipidemia, unspecified: Secondary | ICD-10-CM

## 2013-12-10 DIAGNOSIS — I701 Atherosclerosis of renal artery: Secondary | ICD-10-CM

## 2013-12-10 DIAGNOSIS — I10 Essential (primary) hypertension: Secondary | ICD-10-CM

## 2013-12-10 DIAGNOSIS — R0989 Other specified symptoms and signs involving the circulatory and respiratory systems: Secondary | ICD-10-CM

## 2013-12-10 DIAGNOSIS — I739 Peripheral vascular disease, unspecified: Secondary | ICD-10-CM

## 2013-12-10 DIAGNOSIS — Z9861 Coronary angioplasty status: Secondary | ICD-10-CM

## 2013-12-10 DIAGNOSIS — R0609 Other forms of dyspnea: Secondary | ICD-10-CM

## 2013-12-10 DIAGNOSIS — I4891 Unspecified atrial fibrillation: Secondary | ICD-10-CM

## 2013-12-10 NOTE — Patient Instructions (Signed)
Your Echocardiogram looked good - pretty normal function with normal valve function.  At this point, I think that if Dr Lequita HaltAluisio determines that you need hip surgery, I would recommend proceeding without doing any additional studies.  With your stroke history & age, you would be considered at least Moderate Cardiac Risk for a Moderate Risk surgery -- I feel that delaying surgery for stress test or cardiac catheterizations would simply delay a needed operation to get you back on your feet.  Marykay Lexavid W Raelie Lohr, MD  I will see you back in 6 months.  Your physician wants you to follow-up in: 6 months.  You will receive a reminder letter in the mail two months in advance. If you don't receive a letter, please call our office to schedule the follow-up appointment.

## 2013-12-13 ENCOUNTER — Encounter: Payer: Self-pay | Admitting: Cardiology

## 2013-12-13 NOTE — Assessment & Plan Note (Signed)
Relatively normal Echo -- I suspect exertional dyspnea may just be deconditioning.  In absence of Angina, would not pursue ischemic evaluation.

## 2013-12-13 NOTE — Assessment & Plan Note (Signed)
Patent stents. No active angina -- on Plavix & ARB.  No BB due to relative bradycardia. Not on statin - due to intolerance.  On fish oil.  With her age - would not be more aggressive.

## 2013-12-13 NOTE — Assessment & Plan Note (Signed)
Stable - currently in NSR.  Minimal symptoms from Afib.   Normal Echo. As discussed in last visit - Plavix over more complete anticoagulation. I think that, with the lack of significant CAD related symptoms, that we agreed not to pursue additional with stress testing, even if Hip Surgery is planned.

## 2013-12-13 NOTE — Progress Notes (Signed)
PATIENT: [name] MRN: [id number] DOB: 1918-04-13 PCP: [name], MD  Clinic Note: Chief Complaint  Patient presents with  . Follow-up    results of test  no chest pain , sob, little edema   HPI: [name] is a 80 y.o. female with a PMH below who presents today for follow-up after her visit last month for new diagnosis of Afib - with CVR.  She has a history of known two-vessel coronary disease status post PCI of LAD and RCA with PTCA of the diagonal, history of right renal artery stenosis status post stenting. She is on long-term Plavix. Her last heart catheterization was in 2010 showing patent stents as well as patent RCA stent and maybe 30% ISR. She echocardiogram in 2013 showing normal EF with grade 2 diastolic dysfunction, and mildly elevated pulmonary pressures -- Echo on 11/23/2013 showed EF 60-65% - no commend on diastolic function.  Interval History: She was completely asymptomatic from Afib besides some mild dyspnea. We agreed on Plavix over NOAC/Warfarin for CVA prevention.  Her major concern today is associated with significant pain in her left hip going down to her legs. His really limiting her activity over last few weeks. Dr. Lequita Halt plans to try hip injections to determine if the pain is relate dto the hip vs her back.   Depending on how that works, they may consider redo surgery. This makes the new diagnosis of atrial fibrillation more concerning.  From a cardiac standpoint the main thing she notes is that she has been feeling the sensation of some mild exertional dyspnea - but is really more limited by her hip pain.. She feels dizzy on occasion but has not had any significant falls as a result of feeling of dizziness. No syncope or near-syncope, TIA or amaurosis fugax symptoms. While she has noted some irregular sensations in her chest, she really doesn't note anything overly concerning for her. She denies any chest tightness or pressure with rest or exertion, but  over last couple weeks has not really been over to exert herself very much. Prior to that she was able to do what I recommend she wanted to do including washing her daughter's dirty dishes.  She denies any melena, hematochezia or hematuria. No claudication.  Past Medical History  Diagnosis Date  . CAD S/P percutaneous coronary angioplasty 2004    ZETA BMS 4.0 mm X 15 mm in RCA - 2004; CYPHER DES 2.22mm x 18 mm to LAD (with PTCA of jailed D2 with ostial 85%)& 2 in 2007  . TIA (transient ischemic attack)   . Renal artery stenosis, native 2003-2004    S/P R Renal A Stent 5 mm x 12 mm  . Hypertension     H/o Renovascular HTN, s/p R RA Stent in 06/2003  . Hyperlipidemia   . Diverticulosis   . GERD (gastroesophageal reflux disease)     TIA  . PUD (peptic ulcer disease)     Gastric Ulcer  . Gastric outlet obstruction 2012    remote -- s/p Vagotomy & Pyeloroplasty  . PAF (paroxysmal atrial fibrillation) April 2015    Newly diagnosed by PCP  . Multiple allergies   . Anxiety   . Depression   . History of blood transfusion     With Hip Surgery  . Cataract     removed bil eyes  . Osteoporosis     s/p L hip Fxr - Hemiarthroplasty  . Arthritis     R Hip total Arthroplasty & L  Hip Hemiarthroplasty    Prior Cardiac Evaluation /Procedure / Surgical History:  Procedure Laterality Date  . Renal artery stent  07/22/2003    Cordis 5x3612mm stent to right renal artery (Dr. Jonette Eva. [facility])  . Transthoracic echocardiogram  11/2011    EF 60-65%, grade 2 diastolic dysfunction; calcified MV annulus; LA mildly dilated; PA pressure increased to 39mmH  . Nm myocar perf wall motion  08/2007    persantine myoview - normal pattern of perfusion, EF 80%, low risk scan  . Cardiac catheterization  01/17/2009    normal L main, normal LAD, normal L Cfx, RCA with patent stent (Dr. Erlene QuanJ. Berry)  . Percutaneous coronary stent intervention (pci-s)  06/24/2003    ACS Zeta BMS 4.0x9315mm to RCA (Dr. Jonette Eva. [facility])  .  Percutaneous coronary stent intervention (pci-s)  11/13/2005    Cypher DES 2.5x6818mm to LAD; Cutting PTCA of Ostial D2 (jailed) (Dr. Jonette Eva. [facility])  . Transthoracic echocardiogram  11/23/2013    EF 55-60%, no regional WMA; MAC without MR   no notable carotid disease on Doppler  No Known Allergies  Medications Reviewed in Epic  History   Social History Narrative   She was a former long term patient of Dr. Susa Griffinsichard [facility]. He also followed her husband, Rayna SextonRalph, who died several years ago at age 78, he was followed for a history of aortic valve replacement.   She is therefore a widowed mother of 2, grandmother of at least 3.   She still lives alone, but her daughter lives next door. She gets around quite well using a walker until her recent problems with hip pain.   She is a nonsmoker, never drank alcohol.   ROS: A comprehensive Review of Systems - Negative except Symptoms noted above.Stable GERD on PPI. Mild end of the day swelling. She also has some occasional hot pins and needles sensation in her feet.  PHYSICAL EXAM BP 110/78  Pulse 68  Ht 5\' 3"  (1.6 m)  Wt 132 lb 11.2 oz (60.192 kg)  BMI 23.51 kg/m2 General appearance: alert, cooperative, appears stated age, no distress. She has significant thoracic kyphoscoliosis; she is mildly frail, but vibrant mentally. HEENT: [place]/AT, EOMI, MMM, anicteric sclera - with post-cataract surgical findings Neck: no adenopathy, no carotid bruit, no JVD, supple, symmetrical, trachea midline and thyroid not enlarged, symmetric, no tenderness/mass/nodules Lungs: clear to auscultation bilaterally, normal percussion bilaterally and Nonlabored, good air movement Heart: RRR, normal S1 with a soft S4. Nondisplaced PMI. 1-2/6 early peaking C-D SEM at RUSB radiating to carotids. No other rubs or gallops Abdomen: soft, non-tender; bowel sounds normal; no masses,  no organomegaly Extremities: extremities normal, atraumatic, no cyanosis or edema and Minimal, small  caliber varicosities; no signs of venous stasis dermatitis. Pulses: 2+ and symmetric Neurologic: Alert & orientation: X4,  thought content appropriate, affect: normal and Pleasant;   Adult ECG Report - no EKG today  Recent Labs: None recently checked  ASSESSMENT / PLAN: Very pleasant elderly woman who was very sharp mentally, and previously very with exception of the last few weeks in the company of a significant left-sided hip pain. She has a new diagnosis of atrial fibrillation with minimal symptoms. In the absence of potential preoperative evaluation, and my recommendation would be to simply continue with Plavix for stroke dimension based on my discussion with Dr. Tiburcio PeaHarris. She is already rate controlled with her current regimen without rate control agents.   We discussed potential hip surgery and decided that we would not pursue additional evaluation  with Myoview ST as she would not benefit symptomatically from Cath-PCI (nor would she really want to go that direction).  I do not think that would notably improve her symptomatically i.e. invasive evaluation with possible stenting. In that case, I would not probably proceed with stress testing as she is now noticing significant symptoms beyond exertional dyspnea which can easily be explained by the nature fibrillation and lack of ability to increase her heart rate.  Paroxysmal a-fib Stable - currently in NSR.  Minimal symptoms from Afib.   Normal Echo. As discussed in last visit - Plavix over more complete anticoagulation. I think that, with the lack of significant CAD related symptoms, that we agreed not to pursue additional with stress testing, even if Hip Surgery is planned.  CAD S/P percutaneous coronary angioplasty: BMS to RCA in 2004; DES to LAD with PTCA of D2 in 2007;  patent in 2010 Patent stents. No active angina -- on Plavix & ARB.  No BB due to relative bradycardia. Not on statin - due to intolerance.  On fish oil.  With her age -  would not be more aggressive.   HYPERTENSION Very controlled on ARB.  PERIPHERAL VASCULAR DISEASE No claudication.  RENAL ARTERY STENOSIS Consider relook dopplers - but with normal BP, I doubt that any significant stenosis is present.  Exertional dyspnea Relatively normal Echo -- I suspect exertional dyspnea may just be deconditioning.  In absence of Angina, would not pursue ischemic evaluation.    No orders of the defined types were placed in this encounter.   No orders of the defined types were placed in this encounter.    Followup: Roughly 1 month  DAVID W. Herbie [place], M.D., M.S. Interventional Cardiolgy [facility]

## 2013-12-13 NOTE — Assessment & Plan Note (Signed)
Consider relook dopplers - but with normal BP, I doubt that any significant stenosis is present.

## 2013-12-13 NOTE — Assessment & Plan Note (Signed)
No claudication.  ?

## 2013-12-13 NOTE — Assessment & Plan Note (Signed)
Very controlled on ARB.

## 2014-04-09 ENCOUNTER — Telehealth: Payer: Self-pay | Admitting: Cardiology

## 2014-04-09 NOTE — Telephone Encounter (Signed)
Closed encounter °

## 2014-05-24 ENCOUNTER — Encounter: Payer: Self-pay | Admitting: Cardiology

## 2014-05-24 ENCOUNTER — Ambulatory Visit (INDEPENDENT_AMBULATORY_CARE_PROVIDER_SITE_OTHER): Payer: Medicare HMO | Admitting: Cardiology

## 2014-05-24 VITALS — BP 120/68 | HR 59 | Ht <= 58 in | Wt 135.6 lb

## 2014-05-24 DIAGNOSIS — I739 Peripheral vascular disease, unspecified: Secondary | ICD-10-CM

## 2014-05-24 DIAGNOSIS — Z9861 Coronary angioplasty status: Secondary | ICD-10-CM

## 2014-05-24 DIAGNOSIS — R0609 Other forms of dyspnea: Secondary | ICD-10-CM

## 2014-05-24 DIAGNOSIS — I701 Atherosclerosis of renal artery: Secondary | ICD-10-CM

## 2014-05-24 DIAGNOSIS — I480 Paroxysmal atrial fibrillation: Secondary | ICD-10-CM

## 2014-05-24 DIAGNOSIS — I10 Essential (primary) hypertension: Secondary | ICD-10-CM

## 2014-05-24 DIAGNOSIS — E785 Hyperlipidemia, unspecified: Secondary | ICD-10-CM

## 2014-05-24 DIAGNOSIS — I2510 Atherosclerotic heart disease of native coronary artery without angina pectoris: Secondary | ICD-10-CM

## 2014-05-24 NOTE — Patient Instructions (Signed)
NO CHANGE IN MEDICATIONS   Your physician wants you to follow-up in 6 MONTHS DR HARDING.  You will receive a reminder letter in the mail two months in advance. If you don't receive a letter, please call our office to schedule the follow-up appointment.  

## 2014-05-25 ENCOUNTER — Encounter: Payer: Self-pay | Admitting: Cardiology

## 2014-05-25 NOTE — Assessment & Plan Note (Signed)
No active claudication symptoms, possibly because she doesn't walk much.

## 2014-05-25 NOTE — Assessment & Plan Note (Signed)
Patent stents by last cath. No active angina. Continues to be on Plavix plus ARB without beta blocker due to history of bradycardia and statin due to intolerance.  She does not desire any invasive or noninvasive evaluations or aggressive treatments.

## 2014-05-25 NOTE — Assessment & Plan Note (Signed)
Probably partly because of deconditioning. She had a relatively normal echo during last evaluation. She has no desire for further evaluation with stress tests plus or minus cath. She is content doing her daily living activities.

## 2014-05-25 NOTE — Progress Notes (Signed)
PCP: [name], WILLIAM, MD  Clinic Note: Chief Complaint  Patient presents with  . Follow-up    6 mth visit.   pt denies chest pain. pt states that she does experience sob and swelling in her feet   HPI: [name] is a 80 y.o. female with a PMH below who presents today for six-month follow-up of atrial fibrillation and CAD. She has a history of known two-vessel coronary disease status post PCI of LAD and RCA with PTCA of the diagonal, history of right renal artery stenosis status post stenting. She is on long-term Plavix. Her last heart catheterization was in 2010 showing patent stents as well as patent RCA stent and maybe 30% ISR. She echocardiogram in 2013 showing normal EF with grade 2 diastolic dysfunction, and mildly elevated pulmonary pressures -- Echo on 11/23/2013 showed EF 60-65% - no commend on diastolic function.  Past Medical History  Diagnosis Date  . CAD S/P percutaneous coronary angioplasty 2004    ZETA BMS 4.0 mm X 15 mm in RCA - 2004; CYPHER DES 2.835mm x 18 mm to LAD (with PTCA of jailed D2 with ostial 85%)& 2 in 2007  . TIA (transient ischemic attack)   . Renal artery stenosis, native 2003-2004    S/P R Renal A Stent 5 mm x 12 mm  . Hypertension     H/o Renovascular HTN, s/p R RA Stent in 06/2003  . Hyperlipidemia   . Diverticulosis   . GERD (gastroesophageal reflux disease)     TIA  . PUD (peptic ulcer disease)     Gastric Ulcer  . Gastric outlet obstruction 2012    remote -- s/p Vagotomy & Pyeloroplasty  . PAF (paroxysmal atrial fibrillation) April 2015    Newly diagnosed by PCP  . Multiple allergies   . Anxiety   . Depression   . History of blood transfusion     With Hip Surgery  . Cataract     removed bil eyes  . Osteoporosis     s/p L hip Fxr - Hemiarthroplasty  . Arthritis     R Hip total Arthroplasty & L Hip Hemiarthroplasty   Prior Cardiac Evaluation and Procedural History: Procedure Laterality Date  . Renal artery stent  07/22/2003    Cordis  5x4712mm stent to right renal artery (Dr. Jonette Eva. [facility])  . Transthoracic echocardiogram  11/2011    EF 60-65%, grade 2 diastolic dysfunction; calcified MV annulus; LA mildly dilated; PA pressure increased to 39mmH  . Nm myocar perf wall motion  08/2007    persantine myoview - normal pattern of perfusion, EF 80%, low risk scan  . Cardiac catheterization  01/17/2009    normal L main, normal LAD, normal L Cfx, RCA with patent stent (Dr. Erlene QuanJ. Berry)  . Percutaneous coronary stent intervention (pci-s)  06/24/2003    ACS Zeta BMS 4.0x2615mm to RCA (Dr. Jonette Eva. [facility])  . Percutaneous coronary stent intervention (pci-s)  11/13/2005    Cypher DES 2.5x7318mm to LAD; Cutting PTCA of Ostial D2 (jailed) (Dr. Jonette Eva. [facility])  . Transthoracic echocardiogram  11/23/2013    EF 55-60%, no regional WMA; MAC without MR   Interval History: He presents today with no major complaints besides being troubled by seasonal allergies. She notes easy bruising and count a week unsteady gait and dizziness with standing up mostly because of balance issues. She is able to however do her own activities of daily living including: bathing, changing clothes, eating her meals, walking around the house, etc.she does however  note that she can't do things like mopping or vacuuming or long walking mostly because of her loss of balance. She is not complaining of any resting or exertional chest pain. She has no sensation of being in or out of A. Fib. Besides her balance related dizziness, no syncope or near syncopal type episodes, TIA/amaurosis fugax symptoms. No PND, or orthopnea but does note mild ankle edema. She does note some exertional dyspnea if she does much more than her daily living items.  ROS: A comprehensive was performed. Review of Systems  HENT: Positive for congestion and sore throat.        Seasonal allergies  Eyes: Negative for blurred vision.  Respiratory: Positive for cough. Negative for wheezing.        From allergies    Cardiovascular: Negative for claudication.  Gastrointestinal: Negative for blood in stool and melena.  Genitourinary: Negative for hematuria.  Neurological: Positive for dizziness, tremors and weakness. Negative for sensory change, speech change, focal weakness, seizures and loss of consciousness.  Psychiatric/Behavioral: Negative for depression. The patient is not nervous/anxious.   All other systems reviewed and are negative.  Current Outpatient Prescriptions on File Prior to Visit  Medication Sig Dispense Refill  . acetaminophen (TYLENOL) 325 MG tablet Take 650 mg by mouth 2 (two) times daily.    . calcium gluconate 500 MG tablet Take 500 mg by mouth 2 (two) times daily.     . Cholecalciferol (VITAMIN D-3 PO) Take by mouth daily.    . clopidogrel (PLAVIX) 75 MG tablet Take 75 mg by mouth daily.    Marland Kitchen docusate sodium (COLACE) 100 MG capsule Take 100 mg by mouth 2 (two) times daily.      . fish oil-omega-3 fatty acids 1000 MG capsule Take 1 g by mouth 2 (two) times daily.     . folic acid (FOLVITE) 1 MG tablet Take 1 mg by mouth daily.      Marland Kitchen HYDROcodone-acetaminophen (NORCO/VICODIN) 5-325 MG per tablet Take 1 tablet by mouth at bedtime as needed.    Marland Kitchen losartan (COZAAR) 50 MG tablet Take 50 mg by mouth daily.      . mirtazapine (REMERON) 15 MG tablet Take 15 mg by mouth 2 (two) times daily.     . Multiple Vitamin (MULITIVITAMIN WITH MINERALS) TABS Take 1 tablet by mouth daily.    . pantoprazole (PROTONIX) 40 MG tablet Take 40 mg by mouth daily.     . temazepam (RESTORIL) 30 MG capsule Take 30 mg by mouth at bedtime.     No current facility-administered medications on file prior to visit.   ALLERGIES REVIEWED IN EPIC -- No change SOCIAL AND FAMILY HISTORY REVIEWED IN EPIC -- No change  Wt Readings from Last 3 Encounters:  05/24/14 135 lb 9.6 oz (61.508 kg)  12/10/13 132 lb 11.2 oz (60.192 kg)  11/18/13 137 lb (62.143 kg)   PHYSICAL EXAM BP 120/68 mmHg  Pulse 59  Ht 4\' 8"  (1.422  m)  Wt 135 lb 9.6 oz (61.508 kg)  BMI 30.42 kg/m2 General appearance: alert, cooperative, appears stated age, no distress. She has significant thoracic kyphoscoliosis; she is mildly frail, but vibrant mentally. HEENT: [place]/AT, EOMI, MMM, anicteric sclera - with post-cataract surgical findings Neck: no adenopathy, no carotid bruit, no JVD, supple, symmetrical, trachea midline and thyroid not enlarged, symmetric, no tenderness/mass/nodules Lungs: CTA B, normal percussion bilaterally and Nonlabored, good air movement Heart: irregularly irregular with normal rate, normal S1 and S2. Nondisplaced PMI. 1-2/6 early peaking  C-D SEM at RUSB radiating to carotids. No other rubs or gallops Abdomen: soft, non-tender; bowel sounds normal; no masses, no organomegaly Extremities: extremities normal, atraumatic, no cyanosis or edema and Minimal, small caliber varicosities; no signs of venous stasis dermatitis. Pulses: 2+ and symmetric Neurologic: Alert & orientation: X4, thought content appropriate, affect: normal and Pleasant   Adult ECG Report  Rate: 59 ;  Rhythm: coarse atrial fibrillation versus atrial flutter with variable block and slow ventricular response.  Narrative Interpretation: otherwise no ischemic changes. Normal axis and voltage; cannot exclude inferior Q waves.  Recent Labs:  None available   ASSESSMENT / PLAN: CAD S/P percutaneous coronary angioplasty: BMS to RCA in 2004; DES to LAD with PTCA of D2 in 2007;  patent in 2010 Patent stents by last cath. No active angina. Continues to be on Plavix plus ARB without beta blocker due to history of bradycardia and statin due to intolerance.  She does not desire any invasive or noninvasive evaluations or aggressive treatments.  Paroxysmal a-fib Currently in A. Fib today. Minimal symptoms. Rate controlled without any medications. We again discussed the pros and cons of Plavix versus full anticoagulation.she still has mild bruising, but is relatively  unsteady on her feet and is probably a fall risk. Therefore we will not go a full affect regulation. We discussed the risks of either option. The patient and family agree.  Peripheral vascular disease No active claudication symptoms, possibly because she doesn't walk much.  RENAL ARTERY STENOSIS Normal blood pressure. I doubt that the renal artery stents are occluded.  Essential hypertension Well-controlled on ARB  Hyperlipidemia with target LDL less than 70 Unable to tolerate statin according to family member. Labs are being followed by PCP. We'll defer treatment to PCP.  Exertional dyspnea Probably partly because of deconditioning. She had a relatively normal echo during last evaluation. She has no desire for further evaluation with stress tests plus or minus cath. She is content doing her daily living activities.    Orders Placed This Encounter  Procedures  . EKG 12-Lead   Meds ordered this encounter  Medications  . ALPRAZolam (XANAX) 0.5 MG tablet    Sig:   . triamterene-hydrochlorothiazide (MAXZIDE-25) 37.5-25 MG per tablet    Sig:   . Calcium Carb-Cholecalciferol (OYSTER SHELL CALCIUM/VITAMIN D) 250-125 MG-UNIT TABS    Sig:   . Acetaminophen 500 MG coapsule    Sig:   . Camphor-Eucalyptus-Menthol (MEDICATED CHEST RUB) 4.73-1.2-2.6 % OINT    Sig:     Followup: 6 months   HARDING,DAVID W, M.D., M.S. Interventional Cardiologist   Pager # [phone]

## 2014-05-25 NOTE — Assessment & Plan Note (Signed)
Well-controlled on ARB. 

## 2014-05-25 NOTE — Assessment & Plan Note (Signed)
Normal blood pressure. I doubt that the renal artery stents are occluded.

## 2014-05-25 NOTE — Assessment & Plan Note (Signed)
Unable to tolerate statin according to family member. Labs are being followed by PCP. We'll defer treatment to PCP.

## 2014-05-25 NOTE — Assessment & Plan Note (Signed)
Currently in A. Fib today. Minimal symptoms. Rate controlled without any medications. We again discussed the pros and cons of Plavix versus full anticoagulation.she still has mild bruising, but is relatively unsteady on her feet and is probably a fall risk. Therefore we will not go a full affect regulation. We discussed the risks of either option. The patient and family agree.

## 2014-11-15 ENCOUNTER — Ambulatory Visit (INDEPENDENT_AMBULATORY_CARE_PROVIDER_SITE_OTHER): Payer: Commercial Managed Care - HMO | Admitting: Cardiology

## 2014-11-15 VITALS — BP 120/80 | HR 61 | Ht <= 58 in | Wt 134.7 lb

## 2014-11-15 DIAGNOSIS — Z9861 Coronary angioplasty status: Secondary | ICD-10-CM

## 2014-11-15 DIAGNOSIS — R42 Dizziness and giddiness: Secondary | ICD-10-CM

## 2014-11-15 DIAGNOSIS — I10 Essential (primary) hypertension: Secondary | ICD-10-CM

## 2014-11-15 DIAGNOSIS — I4821 Permanent atrial fibrillation: Secondary | ICD-10-CM

## 2014-11-15 DIAGNOSIS — I739 Peripheral vascular disease, unspecified: Secondary | ICD-10-CM | POA: Diagnosis not present

## 2014-11-15 DIAGNOSIS — R0609 Other forms of dyspnea: Secondary | ICD-10-CM

## 2014-11-15 DIAGNOSIS — E785 Hyperlipidemia, unspecified: Secondary | ICD-10-CM | POA: Diagnosis not present

## 2014-11-15 DIAGNOSIS — I482 Chronic atrial fibrillation: Secondary | ICD-10-CM

## 2014-11-15 DIAGNOSIS — I2510 Atherosclerotic heart disease of native coronary artery without angina pectoris: Secondary | ICD-10-CM

## 2014-11-15 NOTE — Progress Notes (Signed)
PCP: [name], MD  Clinic Note: Chief Complaint  Patient presents with  . Shortness of Breath    on exertion  . Dizziness    constant  . Edema    both ankles, mostly when they hang down    HPI: [name] is a 81 y.o. female with a PMH below who presents today for 4 month f/u of CAD & Afib.  She has a history of known two-vessel coronary disease status post PCI of LAD and RCA with PTCA of the diagonal, history of right renal artery stenosis status post stenting. She is on long-term Plavix. Her last heart catheterization was in 2010 showing patent stents as well as patent RCA stent and maybe 30% ISR. She echocardiogram in 2013 showing normal EF with grade 2 diastolic dysfunction, and mildly elevated pulmonary pressures -- Echo on 11/23/2013 showed EF 60-65% - no commend on diastolic function.  Past Medical History  Diagnosis Date  . CAD S/P percutaneous coronary angioplasty 2004    ZETA BMS 4.0 mm X 15 mm in RCA - 2004; CYPHER DES 2.44mm x 18 mm to LAD (with PTCA of jailed D2 with ostial 85%)& 2 in 2007  . TIA (transient ischemic attack)   . Renal artery stenosis, native 2003-2004    S/P R Renal A Stent 5 mm x 12 mm  . Hypertension     H/o Renovascular HTN, s/p R RA Stent in 06/2003  . Hyperlipidemia   . Diverticulosis   . GERD (gastroesophageal reflux disease)     TIA  . PUD (peptic ulcer disease)     Gastric Ulcer  . Gastric outlet obstruction 2012    remote -- s/p Vagotomy & Pyeloroplasty  . PAF (paroxysmal atrial fibrillation) April 2015    Newly diagnosed by PCP  . Multiple allergies   . Anxiety   . Depression   . History of blood transfusion     With Hip Surgery  . Cataract     removed bil eyes  . Osteoporosis     s/p L hip Fxr - Hemiarthroplasty  . Arthritis     R Hip total Arthroplasty & L Hip Hemiarthroplasty    Prior Cardiac Evaluation and Past Surgical History: Past Surgical History  Procedure Laterality Date  . Vagotomy and pyloroplasty   01/13/04  . Replacement total hip w/  resurfacing implants Right 04/2002  . Renal artery stent  07/22/2003    Cordis 5x41mm stent to right renal artery (Dr. Jonette Eva)  . Tonsillectomy    . Cataract extraction Bilateral   . Colonoscopy    . Joint replacement    . Hip arthroplasty  11/26/2011    Procedure: ARTHROPLASTY BIPOLAR HIP;  Surgeon: Javier Docker, MD;  Location: [facility] OR;  Service: Orthopedics;  Laterality: Left;  . Transthoracic echocardiogram  11/2011    EF 60-65%, grade 2 diastolic dysfunction; calcified MV annulus; LA mildly dilated; PA pressure increased to  . Nm myocar perf wall motion  08/2007    persantine myoview - normal pattern of perfusion, EF 80%, low risk scan  . Cardiac catheterization  01/17/2009    normal L main, normal LAD, normal L Cfx, RCA with patent stent (Dr. Erlene Quan)  . Percutaneous coronary stent intervention (pci-s)  06/24/2003    ACS Zeta BMS 4.0x56mm to RCA (Dr. Jonette Eva)  . Percutaneous coronary stent intervention (pci-s)  11/13/2005    Cypher DES 2.5x28mm to LAD; Cutting PTCA of Ostial D2 (jailed) (Dr. Jonette Eva)  .  Transthoracic echocardiogram  11/23/2013    EF 55-60%, no regional WMA; MAC without MR    Interval History: She presents today really without much in the way of major complaints. She still has baseline exertional dyspnea, but she says she has always had. This does not keep her from doing her activities of daily living including bathing, dishes, washing clothes etc. She is not able to do things like vacuuming or mopping because this does make her short of breath. She does not have any resting or exertional chest pressure. She does have mild swelling which she says is really only and she hangs her feet down or is on her feet for long time. She describes feeling dizzy or woozy with him if she bends over and sits leans back up again or if she is sitting for long period time and gets up quickly., She had some good days and some bad days but  that is the most part without complaint.  Cardiovascular ROS: positive for - dyspnea on exertion, edema and Orthostatic dizziness negative for - irregular heartbeat, loss of consciousness, murmur, orthopnea, paroxysmal nocturnal dyspnea, rapid heart rate, shortness of breath or TIA/amaurosis fugax., syncope/near syncope   ROS: A comprehensive was performed. Review of Systems  Constitutional: Negative for malaise/fatigue.  Cardiovascular: Positive for leg swelling (mild - but OK with support stockings).  Gastrointestinal: Negative for blood in stool and melena.  Genitourinary: Negative for hematuria.  Neurological: Positive for dizziness. Negative for headaches.  Psychiatric/Behavioral: Positive for memory loss (some, but is still pretty sharp). Negative for depression. The patient is not nervous/anxious.     Current Outpatient Prescriptions on File Prior to Visit  Medication Sig Dispense Refill  . acetaminophen (TYLENOL) 325 MG tablet Take 650 mg by mouth 2 (two) times daily.    Marland Kitchen. ALPRAZolam (XANAX) 0.5 MG tablet     . calcium gluconate 500 MG tablet Take 500 mg by mouth 2 (two) times daily.     . Cholecalciferol (VITAMIN D-3 PO) Take 1,000 mg by mouth daily.     . clopidogrel (PLAVIX) 75 MG tablet Take 75 mg by mouth daily.    Marland Kitchen. docusate sodium (COLACE) 100 MG capsule Take 100 mg by mouth 2 (two) times daily.      . fish oil-omega-3 fatty acids 1000 MG capsule Take 1 g by mouth 2 (two) times daily.     . folic acid (FOLVITE) 1 MG tablet Take 1 mg by mouth daily.      Marland Kitchen. HYDROcodone-acetaminophen (NORCO/VICODIN) 5-325 MG per tablet Take 1 tablet by mouth at bedtime as needed.    Marland Kitchen. losartan (COZAAR) 50 MG tablet Take 25 mg by mouth daily. TAKE 1/2 TABLET DAILY    . Multiple Vitamin (MULITIVITAMIN WITH MINERALS) TABS Take 1 tablet by mouth daily.    . pantoprazole (PROTONIX) 40 MG tablet Take 40 mg by mouth daily.     . temazepam (RESTORIL) 30 MG capsule Take 30 mg by mouth at bedtime.      . triamterene-hydrochlorothiazide (MAXZIDE-25) 37.5-25 MG per tablet      No current facility-administered medications on file prior to visit.   No Known Allergies   History  Substance Use Topics  . Smoking status: Never Smoker   . Smokeless tobacco: Never Used  . Alcohol Use: No   Family History  Problem Relation Age of Onset  . Uterine cancer Sister   . Heart disease Sister   . Diabetes Son   . Esophageal cancer Neg Hx   .  Colon cancer Neg Hx   . Stomach cancer Neg Hx   . Hypertension Mother   . Stroke Mother   . Hypertension Father   . Stroke Father   . Heart disease Brother      Wt Readings from Last 3 Encounters:  11/15/14 134 lb 11.2 oz (61.1 kg)  05/24/14 135 lb 9.6 oz (61.508 kg)  12/10/13 132 lb 11.2 oz (60.192 kg)    PHYSICAL EXAM BP 120/80 mmHg  Pulse 61  Ht  (1.422 m)  Wt 134 lb 11.2 oz (61.1 kg)  BMI 30.22 kg/m2 General appearance: alert, cooperative, appears stated age, no distress. She has significant thoracic kyphoscoliosis; she is mildly frail, but vibrant mentally. HEENT: [place]/AT, EOMI, MMM, anicteric sclera - with post-cataract surgical findings Neck: no adenopathy, no carotid bruit, no JVD, supple, symmetrical, trachea midline and thyroid not enlarged, symmetric, no tenderness/mass/nodules Lungs: CTA B, normal percussion bilaterally and Nonlabored, good air movement Heart: irregularly irregular with normal rate, normal S1 and S2. Nondisplaced PMI. 1-2/6 early peaking C-D SEM at RUSB radiating to carotids. No other rubs or gallops Abdomen: soft, non-tender; bowel sounds normal; no masses, no organomegaly Extremities: extremities normal, atraumatic, no cyanosis or edema and Minimal, small caliber varicosities; no signs of venous stasis dermatitis. Pulses: 2+ and symmetric Neurologic: Alert & orientation: X4, thought content appropriate, affect: normal and Pleasant    Adult ECG Report  Rate: 61 ;  Rhythm: atrial fibrillation and non-specific  ST-T wave abnormalities (consider digoxin effect)  Narrative Interpretation: Stable EKG  Recent Labs:     None readily available.  ASSESSMENT / PLAN: Problem List Items Addressed This Visit    CAD S/P percutaneous coronary angioplasty: BMS to RCA in 2004; DES to LAD with PTCA of D2 in 2007;  patent in 2010 - Primary (Chronic)    No active angina symptoms. On Plavix without aspirin. No beta blocker due to history of bradycardia. No statin or just an intolerance. On a reduced dose of ARB. No plans for invasive or noninvasive ischemic evaluation or aggressive treatment options.      Relevant Orders   EKG 12-Lead (Completed)   Dizziness    i am a little concerned with her orthostatic dizziness.  She is on quite a bit of blood pressure medication for her age with a diuretic and ARB. I would simply reduced the ARB 25 mg daily. We also talked of the importance of adequate hydration despite her evidence of edema. She is on Maxide which I think it probably B. Type  In the summertime months when she is more likely to be dehydrated. Would allow for permissive hypertension.      Essential hypertension (Chronic)    Well controlled blood pressure. I'm a bit concerned about her dizziness as mentioned above. Will allow for mild respiratory and reduce the dose of losartan. Also allowed to cut the Maxide dose in half and summer months when she is more likely to be dehydrated. We actually may want to just switch 2 a loop diuretic which can be used for edema and avoid the potential electrolyte problems that can occur with Maxide.      Relevant Orders   EKG 12-Lead (Completed)   Exertional dyspnea (Chronic)    Normal systolic function, with A. Fib unable to determine diastolic function. She could probably has diastolic dysfunction that is exacerbated by her A. Fib. She was however able to do great activities of daily living and really is without much no complaint. As  the case with previous evaluations she  is reluctant and has no desire to undergo an ischemic evaluation with either stress test or cath. As she always says she is fine with her ability to do her activities of daily living.      Hyperlipidemia with target LDL less than 70 (Chronic)    Her lab values are being monitored by her PCP. She is not on a statin due to intolerance in the past. She is taking omega-3 fatty acids. We again talked about her goals of care, and she would prefer to not take any other medicines.      Relevant Orders   EKG 12-Lead (Completed)   Peripheral vascular disease   Relevant Orders   EKG 12-Lead (Completed)   Permanent atrial fibrillation (Chronic)    She remains in A. Fib. Not on any rate control agents but with adequate rate control. No signs of bradycardia or tachycardia. She is on Plavix for CAD, and this does provide some stroke prevention projection. In the past we discussed not using full anticoagulation for fall risk issues.         Orders Placed This Encounter  Procedures  . EKG 12-Lead      Followup: 6 months    HARDING, Piedad Climes, M.D., M.S. Interventional Cardiologist   Pager # [phone]

## 2014-11-15 NOTE — Patient Instructions (Addendum)
IN THE SUMMER TIME IF YOU FEEL A LITTLE DEHYDRATED- DO NOT TAKE MAXI ZIDE  CHANGE IN MEDICATIONS----TAKE 1/2 TABLET OF 50 MG LOSARTAN DAILY.  Your physician wants you to follow-up in 6 MONTH DR HARDING. You will receive a reminder letter in the mail two months in advance. If you don't receive a letter, please call our office to schedule the follow-up appointment.

## 2014-11-17 ENCOUNTER — Encounter: Payer: Self-pay | Admitting: Cardiology

## 2014-11-17 DIAGNOSIS — R42 Dizziness and giddiness: Secondary | ICD-10-CM | POA: Insufficient documentation

## 2014-11-17 NOTE — Assessment & Plan Note (Signed)
Normal systolic function, with A. Fib unable to determine diastolic function. She could probably has diastolic dysfunction that is exacerbated by her A. Fib. She was however able to do great activities of daily living and really is without much no complaint. As the case with previous evaluations she is reluctant and has no desire to undergo an ischemic evaluation with either stress test or cath. As she always says she is fine with her ability to do her activities of daily living.

## 2014-11-17 NOTE — Assessment & Plan Note (Signed)
Her lab values are being monitored by her PCP. She is not on a statin due to intolerance in the past. She is taking omega-3 fatty acids. We again talked about her goals of care, and she would prefer to not take any other medicines.

## 2014-11-17 NOTE — Assessment & Plan Note (Signed)
Well controlled blood pressure. I'm a bit concerned about her dizziness as mentioned above. Will allow for mild respiratory and reduce the dose of losartan. Also allowed to cut the Maxide dose in half and summer months when she is more likely to be dehydrated. We actually may want to just switch 2 a loop diuretic which can be used for edema and avoid the potential electrolyte problems that can occur with Maxide.

## 2014-11-17 NOTE — Assessment & Plan Note (Signed)
She remains in A. Fib. Not on any rate control agents but with adequate rate control. No signs of bradycardia or tachycardia. She is on Plavix for CAD, and this does provide some stroke prevention projection. In the past we discussed not using full anticoagulation for fall risk issues.

## 2014-11-17 NOTE — Assessment & Plan Note (Signed)
i am a little concerned with her orthostatic dizziness.  She is on quite a bit of blood pressure medication for her age with a diuretic and ARB. I would simply reduced the ARB 25 mg daily. We also talked of the importance of adequate hydration despite her evidence of edema. She is on Maxide which I think it probably B. Type  In the summertime months when she is more likely to be dehydrated. Would allow for permissive hypertension.

## 2014-11-17 NOTE — Assessment & Plan Note (Signed)
No active angina symptoms. On Plavix without aspirin. No beta blocker due to history of bradycardia. No statin or just an intolerance. On a reduced dose of ARB. No plans for invasive or noninvasive ischemic evaluation or aggressive treatment options.

## 2015-01-06 ENCOUNTER — Telehealth: Payer: Self-pay | Admitting: Cardiology

## 2015-01-06 ENCOUNTER — Emergency Department (HOSPITAL_COMMUNITY): Payer: Commercial Managed Care - HMO

## 2015-01-06 ENCOUNTER — Observation Stay (HOSPITAL_COMMUNITY)
Admission: EM | Admit: 2015-01-06 | Discharge: 2015-01-08 | Disposition: A | Discharge status: Home / Self Care | Payer: Commercial Managed Care - HMO | Attending: Internal Medicine | Admitting: Internal Medicine

## 2015-01-06 ENCOUNTER — Encounter (HOSPITAL_COMMUNITY): Payer: Self-pay | Admitting: Physical Medicine and Rehabilitation

## 2015-01-06 DIAGNOSIS — M25552 Pain in left hip: Secondary | ICD-10-CM

## 2015-01-06 DIAGNOSIS — Z8673 Personal history of transient ischemic attack (TIA), and cerebral infarction without residual deficits: Secondary | ICD-10-CM | POA: Diagnosis not present

## 2015-01-06 DIAGNOSIS — R42 Dizziness and giddiness: Secondary | ICD-10-CM

## 2015-01-06 DIAGNOSIS — I4821 Permanent atrial fibrillation: Secondary | ICD-10-CM | POA: Diagnosis present

## 2015-01-06 DIAGNOSIS — M1990 Unspecified osteoarthritis, unspecified site: Secondary | ICD-10-CM | POA: Insufficient documentation

## 2015-01-06 DIAGNOSIS — Z7902 Long term (current) use of antithrombotics/antiplatelets: Secondary | ICD-10-CM | POA: Insufficient documentation

## 2015-01-06 DIAGNOSIS — R079 Chest pain, unspecified: Secondary | ICD-10-CM | POA: Diagnosis present

## 2015-01-06 DIAGNOSIS — I10 Essential (primary) hypertension: Secondary | ICD-10-CM | POA: Diagnosis not present

## 2015-01-06 DIAGNOSIS — M810 Age-related osteoporosis without current pathological fracture: Secondary | ICD-10-CM | POA: Diagnosis not present

## 2015-01-06 DIAGNOSIS — Z79899 Other long term (current) drug therapy: Secondary | ICD-10-CM | POA: Insufficient documentation

## 2015-01-06 DIAGNOSIS — N189 Chronic kidney disease, unspecified: Secondary | ICD-10-CM | POA: Diagnosis present

## 2015-01-06 DIAGNOSIS — I480 Paroxysmal atrial fibrillation: Secondary | ICD-10-CM | POA: Insufficient documentation

## 2015-01-06 DIAGNOSIS — I701 Atherosclerosis of renal artery: Secondary | ICD-10-CM | POA: Insufficient documentation

## 2015-01-06 DIAGNOSIS — R072 Precordial pain: Secondary | ICD-10-CM

## 2015-01-06 DIAGNOSIS — F419 Anxiety disorder, unspecified: Secondary | ICD-10-CM | POA: Insufficient documentation

## 2015-01-06 DIAGNOSIS — H269 Unspecified cataract: Secondary | ICD-10-CM | POA: Insufficient documentation

## 2015-01-06 DIAGNOSIS — I5032 Chronic diastolic (congestive) heart failure: Secondary | ICD-10-CM | POA: Diagnosis present

## 2015-01-06 DIAGNOSIS — Z9861 Coronary angioplasty status: Secondary | ICD-10-CM | POA: Insufficient documentation

## 2015-01-06 DIAGNOSIS — R05 Cough: Secondary | ICD-10-CM | POA: Diagnosis not present

## 2015-01-06 DIAGNOSIS — K5790 Diverticulosis of intestine, part unspecified, without perforation or abscess without bleeding: Secondary | ICD-10-CM | POA: Diagnosis not present

## 2015-01-06 DIAGNOSIS — K311 Adult hypertrophic pyloric stenosis: Secondary | ICD-10-CM | POA: Insufficient documentation

## 2015-01-06 DIAGNOSIS — F329 Major depressive disorder, single episode, unspecified: Secondary | ICD-10-CM | POA: Insufficient documentation

## 2015-01-06 DIAGNOSIS — I2510 Atherosclerotic heart disease of native coronary artery without angina pectoris: Secondary | ICD-10-CM

## 2015-01-06 DIAGNOSIS — I482 Chronic atrial fibrillation: Secondary | ICD-10-CM | POA: Insufficient documentation

## 2015-01-06 DIAGNOSIS — K279 Peptic ulcer, site unspecified, unspecified as acute or chronic, without hemorrhage or perforation: Secondary | ICD-10-CM | POA: Diagnosis not present

## 2015-01-06 DIAGNOSIS — K219 Gastro-esophageal reflux disease without esophagitis: Secondary | ICD-10-CM | POA: Diagnosis not present

## 2015-01-06 DIAGNOSIS — E785 Hyperlipidemia, unspecified: Secondary | ICD-10-CM | POA: Diagnosis not present

## 2015-01-06 DIAGNOSIS — M25559 Pain in unspecified hip: Secondary | ICD-10-CM

## 2015-01-06 LAB — COMPREHENSIVE METABOLIC PANEL
ALK PHOS: 48 U/L (ref 38–126)
ALT: 12 U/L — AB (ref 14–54)
AST: 21 U/L (ref 15–41)
Albumin: 3.9 g/dL (ref 3.5–5.0)
Anion gap: 10 (ref 5–15)
BUN: 18 mg/dL (ref 6–20)
CHLORIDE: 109 mmol/L (ref 101–111)
CO2: 25 mmol/L (ref 22–32)
Calcium: 11.1 mg/dL — ABNORMAL HIGH (ref 8.9–10.3)
Creatinine, Ser: 1.19 mg/dL — ABNORMAL HIGH (ref 0.44–1.00)
GFR calc Af Amer: 44 mL/min — ABNORMAL LOW (ref >60)
GFR calc non Af Amer: 38 mL/min — ABNORMAL LOW (ref >60)
GLUCOSE: 135 mg/dL — AB (ref 65–99)
POTASSIUM: 3.7 mmol/L (ref 3.5–5.1)
SODIUM: 144 mmol/L (ref 135–145)
TOTAL PROTEIN: 7.4 g/dL (ref 6.5–8.1)
Total Bilirubin: 1.1 mg/dL (ref 0.3–1.2)

## 2015-01-06 LAB — URINALYSIS, ROUTINE W REFLEX MICROSCOPIC
Bilirubin Urine: NEGATIVE
GLUCOSE, UA: NEGATIVE mg/dL
HGB URINE DIPSTICK: NEGATIVE
KETONES UR: 15 mg/dL — AB
NITRITE: NEGATIVE
PH: 7.5 (ref 5.0–8.0)
Protein, ur: NEGATIVE mg/dL
SPECIFIC GRAVITY, URINE: 1.016 (ref 1.005–1.030)
Urobilinogen, UA: 0.2 mg/dL (ref 0.0–1.0)

## 2015-01-06 LAB — CBC WITH DIFFERENTIAL/PLATELET
BASOS PCT: 0 % (ref 0–1)
Basophils Absolute: 0 10*3/uL (ref 0.0–0.1)
Eosinophils Absolute: 0 10*3/uL (ref 0.0–0.7)
Eosinophils Relative: 0 % (ref 0–5)
HCT: 37.5 % (ref 36.0–46.0)
Hemoglobin: 12.4 g/dL (ref 12.0–15.0)
LYMPHS ABS: 0.7 10*3/uL (ref 0.7–4.0)
Lymphocytes Relative: 7 % — ABNORMAL LOW (ref 12–46)
MCH: 32.5 pg (ref 26.0–34.0)
MCHC: 33.1 g/dL (ref 30.0–36.0)
MCV: 98.2 fL (ref 78.0–100.0)
MONO ABS: 1 10*3/uL (ref 0.1–1.0)
MONOS PCT: 9 % (ref 3–12)
NEUTROS PCT: 84 % — AB (ref 43–77)
Neutro Abs: 9.4 10*3/uL — ABNORMAL HIGH (ref 1.7–7.7)
PLATELETS: 134 10*3/uL — AB (ref 150–400)
RBC: 3.82 MIL/uL — ABNORMAL LOW (ref 3.87–5.11)
RDW: 13.5 % (ref 11.5–15.5)
WBC: 11.2 10*3/uL — ABNORMAL HIGH (ref 4.0–10.5)

## 2015-01-06 LAB — URINE MICROSCOPIC-ADD ON

## 2015-01-06 LAB — I-STAT TROPONIN, ED: TROPONIN I, POC: 0.02 ng/mL (ref 0.00–0.08)

## 2015-01-06 LAB — TROPONIN I
Troponin I: 0.03 ng/mL (ref <0.031)
Troponin I: 0.03 ng/mL (ref <0.031)

## 2015-01-06 MED ORDER — ONDANSETRON HCL 4 MG/2ML IJ SOLN: 4.0000 mg | Freq: Four times a day (QID) | INTRAMUSCULAR | Status: DC | PRN

## 2015-01-06 MED ORDER — ACETAMINOPHEN 650 MG RE SUPP: 650.0000 mg | Freq: Four times a day (QID) | RECTAL | Status: DC | PRN

## 2015-01-06 MED ORDER — ALPRAZOLAM 0.25 MG PO TABS: 0.2500 mg | ORAL_TABLET | Freq: Every evening | ORAL | Status: DC | PRN

## 2015-01-06 MED ORDER — TEMAZEPAM 15 MG PO CAPS
30.0000 mg | ORAL_CAPSULE | Freq: Every evening | ORAL | Status: DC | PRN
  Administered 2015-01-06 – 2015-01-07 (×2): 30 mg via ORAL
  Filled 2015-01-06 (×2): qty 2

## 2015-01-06 MED ORDER — PANTOPRAZOLE SODIUM 40 MG PO TBEC
40.0000 mg | DELAYED_RELEASE_TABLET | Freq: Every day | ORAL | Status: DC
  Administered 2015-01-07 – 2015-01-08 (×2): 40 mg via ORAL
  Filled 2015-01-06 (×2): qty 1

## 2015-01-06 MED ORDER — LOSARTAN POTASSIUM 50 MG PO TABS
50.0000 mg | ORAL_TABLET | Freq: Every day | ORAL | Status: DC
  Administered 2015-01-07: 50 mg via ORAL
  Filled 2015-01-06: qty 1

## 2015-01-06 MED ORDER — CLOPIDOGREL BISULFATE 75 MG PO TABS
75.0000 mg | ORAL_TABLET | Freq: Every day | ORAL | Status: DC
  Administered 2015-01-07 – 2015-01-08 (×2): 75 mg via ORAL
  Filled 2015-01-06 (×2): qty 1

## 2015-01-06 MED ORDER — ACETAMINOPHEN 325 MG PO TABS: 650.0000 mg | ORAL_TABLET | Freq: Four times a day (QID) | ORAL | Status: DC | PRN

## 2015-01-06 MED ORDER — ENOXAPARIN SODIUM 40 MG/0.4ML ~~LOC~~ SOLN
40.0000 mg | SUBCUTANEOUS | Status: DC
Start: 2015-01-06 — End: 2015-01-08
  Administered 2015-01-06 – 2015-01-07 (×2): 40 mg via SUBCUTANEOUS
  Filled 2015-01-06 (×2): qty 0.4

## 2015-01-06 MED ORDER — ADULT MULTIVITAMIN W/MINERALS CH
1.0000 | ORAL_TABLET | Freq: Every day | ORAL | Status: DC
  Administered 2015-01-07 – 2015-01-08 (×2): 1 via ORAL
  Filled 2015-01-06 (×2): qty 1

## 2015-01-06 MED ORDER — MIRTAZAPINE 7.5 MG PO TABS
15.0000 mg | ORAL_TABLET | Freq: Every day | ORAL | Status: DC
  Administered 2015-01-06 – 2015-01-07 (×2): 15 mg via ORAL
  Filled 2015-01-06 (×2): qty 2

## 2015-01-06 MED ORDER — GUAIFENESIN 100 MG/5ML PO SYRP
200.0000 mg | ORAL_SOLUTION | ORAL | Status: DC | PRN
  Filled 2015-01-06: qty 10

## 2015-01-06 MED ORDER — ACETAMINOPHEN 500 MG PO TABS
1000.0000 mg | ORAL_TABLET | Freq: Four times a day (QID) | ORAL | Status: DC | PRN
  Administered 2015-01-06: 1000 mg via ORAL
  Filled 2015-01-06: qty 2

## 2015-01-06 MED ORDER — ONDANSETRON HCL 4 MG PO TABS: 4.0000 mg | ORAL_TABLET | Freq: Four times a day (QID) | ORAL | Status: DC | PRN

## 2015-01-06 MED ORDER — GI COCKTAIL ~~LOC~~
30.0000 mL | ORAL | Status: DC | PRN
Start: 2015-01-06 — End: 2015-01-08
  Administered 2015-01-06: 30 mL via ORAL
  Filled 2015-01-06: qty 30

## 2015-01-06 MED ORDER — ISOSORBIDE MONONITRATE ER 30 MG PO TB24
15.0000 mg | ORAL_TABLET | Freq: Every day | ORAL | Status: DC
  Administered 2015-01-06 – 2015-01-08 (×3): 15 mg via ORAL
  Filled 2015-01-06 (×3): qty 1

## 2015-01-06 MED ORDER — NITROGLYCERIN 0.4 MG SL SUBL
SUBLINGUAL_TABLET | SUBLINGUAL | Status: AC
  Administered 2015-01-06: 0.4 mg
  Filled 2015-01-06: qty 1

## 2015-01-06 NOTE — ED Provider Notes (Signed)
CSN: [id number]     Arrival date & time 01/06/15  1240 History   First MD Initiated Contact with Patient 01/06/15 1300     Chief Complaint  Patient presents with  . Chest Pain     (Consider location/radiation/quality/duration/timing/severity/associated sxs/prior Treatment) Patient is a 81 y.o. female presenting with chest pain. The history is provided by the patient.  Chest Pain Associated symptoms: cough   Associated symptoms: no abdominal pain, no back pain, no headache, no nausea, no numbness, no shortness of breath, not vomiting and no weakness    patient presents with chest pain. Began around 4:30 in the morning. It is in her lower anterior chest. Somewhat relieved by nitroglycerin. Somewhat worse with breathing. She has had a cough with some sputum production. No fevers. States she's been more fatigued than it did have difficulty walking. She has been somewhat "dizzy" for several months now. It is been worse recently. No blood in the stool. No nausea vomiting. No diaphoresis. She has previous angioplasty with stents. Also has a chronic A. fib now. Previous gastric ulcers.  Past Medical History  Diagnosis Date  . CAD S/P percutaneous coronary angioplasty 2004    ZETA BMS 4.0 mm X 15 mm in RCA - 2004; CYPHER DES 2.73mm x 18 mm to LAD (with PTCA of jailed D2 with ostial 85%)& 2 in 2007  . TIA (transient ischemic attack)   . Renal artery stenosis, native 2003-2004    S/P R Renal A Stent 5 mm x 12 mm  . Hypertension     H/o Renovascular HTN, s/p R RA Stent in 06/2003  . Hyperlipidemia   . Diverticulosis   . GERD (gastroesophageal reflux disease)     TIA  . PUD (peptic ulcer disease)     Gastric Ulcer  . Gastric outlet obstruction 2012    remote -- s/p Vagotomy & Pyeloroplasty  . PAF (paroxysmal atrial fibrillation) April 2015    Newly diagnosed by PCP  . Multiple allergies   . Anxiety   . Depression   . History of blood transfusion     With Hip Surgery  . Cataract     removed  bil eyes  . Osteoporosis     s/p L hip Fxr - Hemiarthroplasty  . Arthritis     R Hip total Arthroplasty & L Hip Hemiarthroplasty   Past Surgical History  Procedure Laterality Date  . Vagotomy and pyloroplasty  01/13/04  . Replacement total hip w/  resurfacing implants Right 04/2002  . Renal artery stent  07/22/2003    Cordis 5x82mm stent to right renal artery (Dr. Jonette Eva)  . Tonsillectomy    . Cataract extraction Bilateral   . Colonoscopy    . Joint replacement    . Hip arthroplasty  11/26/2011    Procedure: ARTHROPLASTY BIPOLAR HIP;  Surgeon: Javier Docker, MD;  Location: [facility] OR;  Service: Orthopedics;  Laterality: Left;  . Transthoracic echocardiogram  11/2011    EF 60-65%, grade 2 diastolic dysfunction; calcified MV annulus; LA mildly dilated; PA pressure increased to  . Nm myocar perf wall motion  08/2007    persantine myoview - normal pattern of perfusion, EF 80%, low risk scan  . Cardiac catheterization  01/17/2009    normal L main, normal LAD, normal L Cfx, RCA with patent stent (Dr. Erlene Quan)  . Percutaneous coronary stent intervention (pci-s)  06/24/2003    ACS Zeta BMS 4.0x81mm to RCA (Dr. Jonette Eva)  . Percutaneous coronary stent  intervention (pci-s)  11/13/2005    Cypher DES 2.5x7mm to LAD; Cutting PTCA of Ostial D2 (jailed) (Dr. Jonette Eva)  . Transthoracic echocardiogram  11/23/2013    EF 55-60%, no regional WMA; MAC without MR   Family History  Problem Relation Age of Onset  . Uterine cancer Sister   . Heart disease Sister   . Diabetes Son   . Esophageal cancer Neg Hx   . Colon cancer Neg Hx   . Stomach cancer Neg Hx   . Hypertension Mother   . Stroke Mother   . Hypertension Father   . Stroke Father   . Heart disease Brother    History  Substance Use Topics  . Smoking status: Never Smoker   . Smokeless tobacco: Never Used  . Alcohol Use: No   OB History    No data available     Review of Systems  Constitutional: Negative for activity  change and appetite change.  Eyes: Negative for pain.  Respiratory: Positive for cough. Negative for chest tightness and shortness of breath.   Cardiovascular: Positive for chest pain. Negative for leg swelling.  Gastrointestinal: Negative for nausea, vomiting, abdominal pain and diarrhea.  Genitourinary: Negative for flank pain.  Musculoskeletal: Negative for back pain and neck stiffness.  Skin: Negative for rash.  Neurological: Negative for weakness, numbness and headaches.  Psychiatric/Behavioral: Negative for behavioral problems.      Allergies  Review of patient's allergies indicates no known allergies.  Home Medications   Prior to Admission medications   Medication Sig Start Date End Date Taking? Authorizing Provider  acetaminophen (TYLENOL) 500 MG tablet Take 1,000 mg by mouth every 6 (six) hours as needed for mild pain.   Yes Historical Provider, MD  ALPRAZolam Prudy Feeler) 0.25 MG tablet Take 0.25 mg by mouth at bedtime as needed for anxiety.   Yes Historical Provider, MD  calcium gluconate 500 MG tablet Take 500 mg by mouth 2 (two) times daily.    Yes Historical Provider, MD  Cholecalciferol (VITAMIN D-3 PO) Take 1,000 mg by mouth daily.    Yes Historical Provider, MD  clopidogrel (PLAVIX) 75 MG tablet Take 75 mg by mouth daily.   Yes Historical Provider, MD  docusate sodium (COLACE) 100 MG capsule Take 100 mg by mouth 2 (two) times daily.     Yes Historical Provider, MD  fish oil-omega-3 fatty acids 1000 MG capsule Take 1 g by mouth 2 (two) times daily.    Yes Historical Provider, MD  folic acid (FOLVITE) 1 MG tablet Take 1 mg by mouth daily.     Yes Historical Provider, MD  losartan (COZAAR) 50 MG tablet Take 50 mg by mouth daily.    Yes Historical Provider, MD  mirtazapine (REMERON) 15 MG tablet Take 15 mg by mouth at bedtime.   Yes Historical Provider, MD  Multiple Vitamin (MULITIVITAMIN WITH MINERALS) TABS Take 1 tablet by mouth daily.   Yes Historical Provider, MD   pantoprazole (PROTONIX) 40 MG tablet Take 40 mg by mouth daily.    Yes Historical Provider, MD  temazepam (RESTORIL) 30 MG capsule Take 30 mg by mouth at bedtime.   Yes Historical Provider, MD  triamterene-hydrochlorothiazide (MAXZIDE-25) 37.5-25 MG per tablet Take 1 tablet by mouth daily as needed (FLUID).  04/13/14  Yes Historical Provider, MD   BP 166/77 mmHg  Pulse 97  Temp(Src) 98.7 F (37.1 C) (Oral)  Resp 20  SpO2 97% Physical Exam  Constitutional: She is oriented to person, place, and time.  She appears well-developed and well-nourished.  HENT:  Head: Normocephalic and atraumatic.  Eyes: Pupils are equal, round, and reactive to light.  Neck: Normal range of motion. Neck supple.  Cardiovascular: Normal rate, regular rhythm and normal heart sounds.   No murmur heard. Pulmonary/Chest: Effort normal and breath sounds normal. No respiratory distress. She has no wheezes. She has no rales. She exhibits tenderness.  Tenderness to anterior lower chest wall.  Abdominal: Soft. Bowel sounds are normal. She exhibits no distension. There is no tenderness.  Musculoskeletal: Normal range of motion.  Neurological: She is alert and oriented to person, place, and time. No cranial nerve deficit.  Skin: Skin is warm and dry.  Psychiatric: She has a normal mood and affect. Her speech is normal.  Nursing note and vitals reviewed.   ED Course  Procedures (including critical care time) Labs Review Labs Reviewed  CBC WITH DIFFERENTIAL/PLATELET - Abnormal; Notable for the following:    WBC 11.2 (*)    RBC 3.82 (*)    Platelets 134 (*)    Neutrophils Relative % 84 (*)    Neutro Abs 9.4 (*)    Lymphocytes Relative 7 (*)    All other components within normal limits  COMPREHENSIVE METABOLIC PANEL - Abnormal; Notable for the following:    Glucose, Bld 135 (*)    Creatinine, Ser 1.19 (*)    Calcium 11.1 (*)    ALT 12 (*)    GFR calc non Af Amer 38 (*)    GFR calc Af Amer 44 (*)    All other  components within normal limits  URINALYSIS, ROUTINE W REFLEX MICROSCOPIC (NOT AT [facility])  Rosezena Sensor, ED    Imaging Review Dg Chest 2 View  01/06/2015   CLINICAL DATA:  Midsternal chest pain with resolution following sublingual nitroglycerin  EXAM: CHEST - 2 VIEW  COMPARISON:  11/27/2011  FINDINGS: Cardiac shadow is mildly enlarged in size. The lungs are well aerated bilaterally with mild interstitial changes. These are stable from previous exams. No focal infiltrate is seen. Compression deformity is noted in the lower thoracic spine stable from 2013.  IMPRESSION: No acute abnormality noted.   Electronically Signed   By: Alcide Clever M.D.   On: 01/06/2015 13:44     EKG Interpretation   Date/Time:  Thursday January 06 2015 12:49:36 EDT Ventricular Rate:  92 PR Interval:    QRS Duration: 100 QT Interval:  342 QTC Calculation: 422 R Axis:   82 Text Interpretation:  Atrial fibrillation with a competing junctional  pacemaker Cannot rule out Inferior infarct , age undetermined Abnormal ECG  Confirmed by Rubin Payor  MD, Harrold Donath [phone]) on 01/06/2015 1:01:50 PM      MDM   Final diagnoses:  Chest pain, unspecified chest pain type  Permanent atrial fibrillation  Dizziness    Patient with chest pain. EKG overall reassuring. Has known cardiac disease. Negative troponin. Also has been dizzy. Will admit to internal medicine.    Benjiman Core, MD 01/06/15 [phone]

## 2015-01-06 NOTE — ED Notes (Signed)
Pt assisted to bedside commode, attempting urine sample again at this time.  Pt requesting In and Out catheter if unable to obtain.

## 2015-01-06 NOTE — Telephone Encounter (Signed)
Received call back from patient's daughter Alona Bene Dr.Harding advised if patient has any more chest pain she needs to go to [facility] ER.Appointment scheduled with him 01/13/15 at 3:45 pm.

## 2015-01-06 NOTE — Telephone Encounter (Signed)
Received a call from patient's daughter [name] she stated mother woke up around 4:00 am with chest pain radiating into back.Took 4 NTG's within 4 hours.Stated mother feels better now only has chest pain when she takes a deep breath.Advised I will speak to Dr.Harding and call you back.  Returned call to daughter [name] no answer.LMTC.

## 2015-01-06 NOTE — H&P (Signed)
[facility] History and Physical  Kollins Fenter NGE:[id number] DOB: 09/09/1917 DOA: 01/06/2015  Referring physician: EDP PCP: [name], MD   Chief Complaint: chest pain  HPI: [name] is a 81 y.o. female chief with past medical history of CAD status post BMS to RCA in 2004 and DES to LAD in 2007, TIA, renal artery stenosis, paroxysmal A. fib not on anticoagulation, GERD, peptic ulcer disease, osteoarthritis, is independent and lives at home by herself presents to the ER with the above complaints. She reports waking up this morning with substernal chest pain, which radiated to her back and her neck she took a sublingual nitroglycerin tablet felt a little better but the chest pain recurred and hence took 2 more in a 3 hour span. She also reports having some dyspnea and acute and chronic dizziness. She also has a mild cough which she thinks is somewhat chronic at this point, and could be related to allergies. Subsequently called Dr. Elissa Hefty office and was referred to the ER. EKG without acute ST changes, troponin 1 negative   Review of Systems: Positives bolded Constitutional:  No weight loss, night sweats, Fevers, chills, fatigue.  HEENT:  No headaches, Difficulty swallowing,Tooth/dental problems,Sore throat,  No sneezing, itching, ear ache, nasal congestion, post nasal drip,  Cardio-vascular:   chest pain, Orthopnea, PND, swelling in lower extremities, anasarca, dizziness, palpitations  GI:  No heartburn, indigestion, abdominal pain, nausea, vomiting, diarrhea, change in bowel habits, loss of appetite  Resp:  No shortness of breath with exertion or at rest. No excess mucus, non productive cough, No non-productive cough, No coughing up of blood.No change in color of mucus.No wheezing.No chest wall deformity  Skin:  no rash or lesions.  GU:  no dysuria, change in color of urine, no urgency or frequency. No flank pain.  Musculoskeletal:  No joint pain or  swelling. No decreased range of motion. No back pain.  Psych:  No change in mood or affect. No depression or anxiety. No memory loss.   Past Medical History  Diagnosis Date  . CAD S/P percutaneous coronary angioplasty 2004    ZETA BMS 4.0 mm X 15 mm in RCA - 2004; CYPHER DES 2.23mm x 18 mm to LAD (with PTCA of jailed D2 with ostial 85%)& 2 in 2007  . TIA (transient ischemic attack)   . Renal artery stenosis, native 2003-2004    S/P R Renal A Stent 5 mm x 12 mm  . Hypertension     H/o Renovascular HTN, s/p R RA Stent in 06/2003  . Hyperlipidemia   . Diverticulosis   . GERD (gastroesophageal reflux disease)     TIA  . PUD (peptic ulcer disease)     Gastric Ulcer  . Gastric outlet obstruction 2012    remote -- s/p Vagotomy & Pyeloroplasty  . PAF (paroxysmal atrial fibrillation) April 2015    Newly diagnosed by PCP  . Multiple allergies   . Anxiety   . Depression   . History of blood transfusion     With Hip Surgery  . Cataract     removed bil eyes  . Osteoporosis     s/p L hip Fxr - Hemiarthroplasty  . Arthritis     R Hip total Arthroplasty & L Hip Hemiarthroplasty   Past Surgical History  Procedure Laterality Date  . Vagotomy and pyloroplasty  01/13/04  . Replacement total hip w/  resurfacing implants Right 04/2002  . Renal artery stent  07/22/2003    Cordis 5x26mm stent  to right renal artery (Dr. Jonette Eva)  . Tonsillectomy    . Cataract extraction Bilateral   . Colonoscopy    . Joint replacement    . Hip arthroplasty  11/26/2011    Procedure: ARTHROPLASTY BIPOLAR HIP;  Surgeon: Javier Docker, MD;  Location: [facility] OR;  Service: Orthopedics;  Laterality: Left;  . Transthoracic echocardiogram  11/2011    EF 60-65%, grade 2 diastolic dysfunction; calcified MV annulus; LA mildly dilated; PA pressure increased to  . Nm myocar perf wall motion  08/2007    persantine myoview - normal pattern of perfusion, EF 80%, low risk scan  . Cardiac catheterization  01/17/2009     normal L main, normal LAD, normal L Cfx, RCA with patent stent (Dr. Erlene Quan)  . Percutaneous coronary stent intervention (pci-s)  06/24/2003    ACS Zeta BMS 4.0x60mm to RCA (Dr. Jonette Eva)  . Percutaneous coronary stent intervention (pci-s)  11/13/2005    Cypher DES 2.5x20mm to LAD; Cutting PTCA of Ostial D2 (jailed) (Dr. Jonette Eva)  . Transthoracic echocardiogram  11/23/2013    EF 55-60%, no regional WMA; MAC without MR   Social History:  reports that she has never smoked. She has never used smokeless tobacco. She reports that she does not drink alcohol or use illicit drugs.  No Known Allergies  Family History  Problem Relation Age of Onset  . Uterine cancer Sister   . Heart disease Sister   . Diabetes Son   . Esophageal cancer Neg Hx   . Colon cancer Neg Hx   . Stomach cancer Neg Hx   . Hypertension Mother   . Stroke Mother   . Hypertension Father   . Stroke Father   . Heart disease Brother    Prior to Admission medications   Medication Sig Start Date End Date Taking? Authorizing Provider  acetaminophen (TYLENOL) 500 MG tablet Take 1,000 mg by mouth every 6 (six) hours as needed for mild pain.   Yes Historical Provider, MD  ALPRAZolam Prudy Feeler) 0.25 MG tablet Take 0.25 mg by mouth at bedtime as needed for anxiety.   Yes Historical Provider, MD  calcium gluconate 500 MG tablet Take 500 mg by mouth 2 (two) times daily.    Yes Historical Provider, MD  Cholecalciferol (VITAMIN D-3 PO) Take 1,000 mg by mouth daily.    Yes Historical Provider, MD  clopidogrel (PLAVIX) 75 MG tablet Take 75 mg by mouth daily.   Yes Historical Provider, MD  docusate sodium (COLACE) 100 MG capsule Take 100 mg by mouth 2 (two) times daily.     Yes Historical Provider, MD  fish oil-omega-3 fatty acids 1000 MG capsule Take 1 g by mouth 2 (two) times daily.    Yes Historical Provider, MD  folic acid (FOLVITE) 1 MG tablet Take 1 mg by mouth daily.     Yes Historical Provider, MD  losartan (COZAAR) 50 MG  tablet Take 50 mg by mouth daily.    Yes Historical Provider, MD  mirtazapine (REMERON) 15 MG tablet Take 15 mg by mouth at bedtime.   Yes Historical Provider, MD  Multiple Vitamin (MULITIVITAMIN WITH MINERALS) TABS Take 1 tablet by mouth daily.   Yes Historical Provider, MD  pantoprazole (PROTONIX) 40 MG tablet Take 40 mg by mouth daily.    Yes Historical Provider, MD  temazepam (RESTORIL) 30 MG capsule Take 30 mg by mouth at bedtime.   Yes Historical Provider, MD  triamterene-hydrochlorothiazide (MAXZIDE-25) 37.5-25 MG per tablet Take  1 tablet by mouth daily as needed (FLUID).  04/13/14  Yes Historical Provider, MD   Physical Exam: Filed Vitals:   01/06/15 1315 01/06/15 1400 01/06/15 1415 01/06/15 1430  BP: 155/88 135/75 144/52 166/77  Pulse: 62 56 76 97  Temp:      TempSrc:      Resp: SpO2: 94% 93% 94% 97%    Wt Readings from Last 3 Encounters:  11/15/14 61.1 kg (134 lb 11.2 oz)  05/24/14 61.508 kg (135 lb 9.6 oz)  12/10/13 60.192 kg (132 lb 11.2 oz)    General:  Appears calm and comfortable, no distress, AAO 3 Eyes: PERRL, normal lids, irises & conjunctiva ENT: grossly normal hearing, lips & tongue Neck: no LAD, masses or thyromegaly Cardiovascular: Irregular rate and rhythm, no m/r/g. No LE edema. Telemetry: SR, no arrhythmias  Respiratory: CTA bilaterally, no w/r/r. Normal respiratory effort. Abdomen: soft, ntnd Skin: no rash or induration seen on limited exam Musculoskeletal: grossly normal tone BUE/BLE Psychiatric: grossly normal mood and affect, speech fluent and appropriate Neurologic: grossly non-focal.          Labs on Admission:  Basic Metabolic Panel:  Recent Labs Lab 01/06/15 1348  NA 144  K 3.7  CL 109  CO2 25  GLUCOSE 135*  BUN 18  CREATININE 1.19*  CALCIUM 11.1*   Liver Function Tests:  Recent Labs Lab 01/06/15 1348  AST 21  ALT 12*  ALKPHOS 48  BILITOT 1.1  PROT 7.4  ALBUMIN 3.9   No results for input(s): LIPASE,  AMYLASE in the last 168 hours. No results for input(s): AMMONIA in the last 168 hours. CBC:  Recent Labs Lab 01/06/15 1348  WBC 11.2*  NEUTROABS 9.4*  HGB 12.4  HCT 37.5  MCV 98.2  PLT 134*   Cardiac Enzymes: No results for input(s): CKTOTAL, CKMB, CKMBINDEX, TROPONINI in the last 168 hours.  BNP (last 3 results) No results for input(s): BNP in the last 8760 hours.  ProBNP (last 3 results) No results for input(s): PROBNP in the last 8760 hours.  CBG: No results for input(s): GLUCAP in the last 168 hours.  Radiological Exams on Admission: Dg Chest 2 View  01/06/2015   CLINICAL DATA:  Midsternal chest pain with resolution following sublingual nitroglycerin  EXAM: CHEST - 2 VIEW  COMPARISON:  11/27/2011  FINDINGS: Cardiac shadow is mildly enlarged in size. The lungs are well aerated bilaterally with mild interstitial changes. These are stable from previous exams. No focal infiltrate is seen. Compression deformity is noted in the lower thoracic spine stable from 2013.  IMPRESSION: No acute abnormality noted.   Electronically Signed   By: Alcide Clever M.D.   On: 01/06/2015 13:44    EKG: Independently reviewed. A. fib no acute ST-T wave changes  Assessment/Plan  1. Atypical chest pain EKG without acute findings, given cardiac history will admit to telemetry  -check cardiac enzymes check 2-D echocardiogram, - continue Plavix - Cardiology consulted  2.   CAD S/P percutaneous coronary angioplasty: BMS to RCA in 2004; DES to LAD with PTCA of D2 in 2007;  patent in 2010 -Continue Plavix, Not on beta blockers reportedly due to history of bradycardia  3. Paroxysmal  atrial fibrillation -Rate controlled, not on anticoagulation likely due to advanced age  50. History of urinary retention; during hospitalizations -I&O cath as needed  5. Hyperglycemia -Check HbA1c  6. Hypertension -Stable continue Cozaar, hold Maxide  Code Status:DNR DVT Prophylaxis: lovenox Family  Communication:  daughter at bedside Disposition Plan: observation  Time spent:  [facility] Pager [phone]

## 2015-01-06 NOTE — ED Notes (Signed)
Pt presents to department for evaluation of midsternal chest pain radiating to back, onset 4:30am, pt took x3 sublingual nitroglycerin at home with some relief. Pain increases with deep breathing. Denies chest pain upon arrival. Pt is alert and oriented x4.

## 2015-01-06 NOTE — Consult Note (Signed)
Reason for Consult: chest pain   Referring Physician: Dr. Alvino Chapel   PCP:  Shirline Frees, MD  Primary Cardiologist: Dr. Meredeth Ide [name] is an 81 y.o. female.    Chief Complaint: chest pain mid sternal radiating to back.  NTG with some relief.      HPI: 81 year old female with known two-vessel coronary disease status post PCI of LAD and RCA with PTCA of the diagonal, history of right renal artery stenosis status post stenting. She is on long-term Plavix. Her last heart catheterization was in 2010 showing patent stents as well as patent RCA stent and maybe 30% ISR. She echocardiogram in 2013 showing normal EF with grade 2 diastolic dysfunction, and mildly elevated pulmonary pressures -- Echo on 11/23/2013 showed EF 60-65% - no comment on diastolic function.  Also hx PAF,  SEE BELOW for further PMH.  Today she woke at 3 for BR and could not go back to sleep.  Around  4 am while awake she developed substernal chest pain.  + radiation to back and her neck.  NTG with some improvement but the pain returned and she took more NTG. + SOB and acute and chronic dizziness.  Usually with position change but today all day.   She is having some chest pain now.     EKG a flutter rate controlled, non specific ST-T wave abnormalities.  no acute changes from April 2016 -considered in permanent a fib, with elevated CHA2DS2VASC score of 7 but only on plavix due to high risk for falls.   POC troponin 0.02,  Cr 1.19, elevated glucose.   This does not feel like indigestion.  Being admitted to tele.  Past Medical History  Diagnosis Date  . CAD S/P percutaneous coronary angioplasty 2004    ZETA BMS 4.0 mm X 15 mm in RCA - 2004; CYPHER DES 2.53mm x 18 mm to LAD (with PTCA of jailed D2 with ostial 85%)& 2 in 2007  . TIA (transient ischemic attack)   . Renal artery stenosis, native 2003-2004    S/P R Renal A Stent 5 mm x 12 mm  . Hypertension     H/o Renovascular HTN, s/p R RA Stent in  06/2003  . Hyperlipidemia   . Diverticulosis   . GERD (gastroesophageal reflux disease)     TIA  . PUD (peptic ulcer disease)     Gastric Ulcer  . Gastric outlet obstruction 2012    remote -- s/p Vagotomy & Pyeloroplasty  . PAF (paroxysmal atrial fibrillation) April 2015    Newly diagnosed by PCP  . Multiple allergies   . Anxiety   . Depression   . History of blood transfusion     With Hip Surgery  . Cataract     removed bil eyes  . Osteoporosis     s/p L hip Fxr - Hemiarthroplasty  . Arthritis     R Hip total Arthroplasty & L Hip Hemiarthroplasty    Past Surgical History  Procedure Laterality Date  . Vagotomy and pyloroplasty  01/13/04  . Replacement total hip w/  resurfacing implants Right 04/2002  . Renal artery stent  07/22/2003    Cordis 5x51mm stent to right renal artery (Dr. Marella Chimes)  . Tonsillectomy    . Cataract extraction Bilateral   . Colonoscopy    . Joint replacement    . Hip arthroplasty  11/26/2011    Procedure: ARTHROPLASTY BIPOLAR HIP;  Surgeon: Doroteo Bradford  Diane Cong, MD;  Location: [facility];  Service: Orthopedics;  Laterality: Left;  . Transthoracic echocardiogram  11/2011    EF 16-10%, grade 2 diastolic dysfunction; calcified MV annulus; LA mildly dilated; PA pressure increased to 38mmH  . Nm myocar perf wall motion  08/2007    persantine myoview - normal pattern of perfusion, EF 80%, low risk scan  . Cardiac catheterization  01/17/2009    normal L main, normal LAD, normal L Cfx, RCA with patent stent (Dr. Adora Fridge)  . Percutaneous coronary stent intervention (pci-s)  06/24/2003    ACS Zeta BMS 4.0x67mm to RCA (Dr. Marella Chimes)  . Percutaneous coronary stent intervention (pci-s)  11/13/2005    Cypher DES 2.5x70mm to LAD; Cutting PTCA of Ostial D2 (jailed) (Dr. Marella Chimes)  . Transthoracic echocardiogram  11/23/2013    EF 55-60%, no regional WMA; MAC without MR    Family History  Problem Relation Age of Onset  . Uterine cancer Sister   . Heart disease  Sister   . Diabetes Son   . Esophageal cancer Neg Hx   . Colon cancer Neg Hx   . Stomach cancer Neg Hx   . Hypertension Mother   . Stroke Mother   . Hypertension Father   . Stroke Father   . Heart disease Brother    Social History:  reports that she has never smoked. She has never used smokeless tobacco. She reports that she does not drink alcohol or use illicit drugs.  Allergies: No Known Allergies  OUTPATIENT MEDS: No current facility-administered medications on file prior to encounter.   Current Outpatient Prescriptions on File Prior to Encounter  Medication Sig Dispense Refill  . calcium gluconate 500 MG tablet Take 500 mg by mouth 2 (two) times daily.     . Cholecalciferol (VITAMIN D-3 PO) Take 1,000 mg by mouth daily.     . clopidogrel (PLAVIX) 75 MG tablet Take 75 mg by mouth daily.    Marland Kitchen docusate sodium (COLACE) 100 MG capsule Take 100 mg by mouth 2 (two) times daily.      . fish oil-omega-3 fatty acids 1000 MG capsule Take 1 g by mouth 2 (two) times daily.     . folic acid (FOLVITE) 1 MG tablet Take 1 mg by mouth daily.      Marland Kitchen losartan (COZAAR) 50 MG tablet Take 50 mg by mouth daily.     . Multiple Vitamin (MULITIVITAMIN WITH MINERALS) TABS Take 1 tablet by mouth daily.    . pantoprazole (PROTONIX) 40 MG tablet Take 40 mg by mouth daily.     . temazepam (RESTORIL) 30 MG capsule Take 30 mg by mouth at bedtime.    . triamterene-hydrochlorothiazide (MAXZIDE-25) 37.5-25 MG per tablet Take 1 tablet by mouth daily as needed (FLUID).        Results for orders placed or performed during the hospital encounter of 01/06/15 (from the past 48 hour(s))  CBC with Differential     Status: Abnormal   Collection Time: 01/06/15  1:48 PM  Result Value Ref Range   WBC 11.2 (H) 4.0 - 10.5 K/uL   RBC 3.82 (L) 3.87 - 5.11 MIL/uL   Hemoglobin 12.4 12.0 - 15.0 g/dL   HCT 37.5 36.0 - 46.0 %   MCV 98.2 78.0 - 100.0 fL   MCH 32.5 26.0 - 34.0 pg   MCHC 33.1 30.0 - 36.0 g/dL   RDW 13.5 11.5 -  15.5 %   Platelets 134 (L) 150 - 400 K/uL  Neutrophils Relative % 84 (H) 43 - 77 %   Neutro Abs 9.4 (H) 1.7 - 7.7 K/uL   Lymphocytes Relative 7 (L) 12 - 46 %   Lymphs Abs 0.7 0.7 - 4.0 K/uL   Monocytes Relative 9 3 - 12 %   Monocytes Absolute 1.0 0.1 - 1.0 K/uL   Eosinophils Relative 0 0 - 5 %   Eosinophils Absolute 0.0 0.0 - 0.7 K/uL   Basophils Relative 0 0 - 1 %   Basophils Absolute 0.0 0.0 - 0.1 K/uL  Comprehensive metabolic panel     Status: Abnormal   Collection Time: 01/06/15  1:48 PM  Result Value Ref Range   Sodium 144 135 - 145 mmol/L   Potassium 3.7 3.5 - 5.1 mmol/L   Chloride 109 101 - 111 mmol/L   CO2 25 22 - 32 mmol/L   Glucose, Bld 135 (H) 65 - 99 mg/dL   BUN 18 6 - 20 mg/dL   Creatinine, Ser 1.19 (H) 0.44 - 1.00 mg/dL   Calcium 11.1 (H) 8.9 - 10.3 mg/dL   Total Protein 7.4 6.5 - 8.1 g/dL   Albumin 3.9 3.5 - 5.0 g/dL   AST 21 15 - 41 U/L   ALT 12 (L) 14 - 54 U/L   Alkaline Phosphatase 48 38 - 126 U/L   Total Bilirubin 1.1 0.3 - 1.2 mg/dL   GFR calc non Af Amer 38 (L) >60 mL/min   GFR calc Af Amer 44 (L) >60 mL/min    Comment: (NOTE) The eGFR has been calculated using the CKD EPI equation. This calculation has not been validated in all clinical situations. eGFR's persistently <60 mL/min signify possible Chronic Kidney Disease.    Anion gap 10 5 - 15  I-Stat Troponin, ED (not at [facility])     Status: None   Collection Time: 01/06/15  1:56 PM  Result Value Ref Range   Troponin i, poc 0.02 0.00 - 0.08 ng/mL   Comment 3            Comment: Due to the release kinetics of cTnI, a negative result within the first hours of the onset of symptoms does not rule out myocardial infarction with certainty. If myocardial infarction is still suspected, repeat the test at appropriate intervals.    Dg Chest 2 View  01/06/2015   CLINICAL DATA:  Midsternal chest pain with resolution following sublingual nitroglycerin  EXAM: CHEST - 2 VIEW  COMPARISON:  11/27/2011  FINDINGS:  Cardiac shadow is mildly enlarged in size. The lungs are well aerated bilaterally with mild interstitial changes. These are stable from previous exams. No focal infiltrate is seen. Compression deformity is noted in the lower thoracic spine stable from 2013.  IMPRESSION: No acute abnormality noted.   Electronically Signed   By: Inez Catalina M.D.   On: 01/06/2015 13:44    ROS: General:no colds or fevers, no weight changes PENDING Skin:no rashes or ulcers HEENT:no blurred vision, no congestion CV:see HPI PUL:see HPI GI:no diarrhea constipation or melena, no indigestion GU:no hematuria, no dysuria MS:no joint pain, no claudication Neuro:no syncope, no lightheadedness Endo:no diabetes, no thyroid disease   Blood pressure 166/77, pulse 97, temperature 98.7 F (37.1 C), temperature source Oral, resp. rate 20, SpO2 97 %.  Wt Readings from Last 3 Encounters:  11/15/14 134 lb 11.2 oz (61.1 kg)  05/24/14 135 lb 9.6 oz (61.508 kg)  12/10/13 132 lb 11.2 oz (60.192 kg)    PE: General:Pleasant affect, NAD still with some  chest pain Skin:Warm and dry, brisk capillary refill HEENT:normocephalic, sclera clear, mucus membranes moist Neck:supple, no JVD, no bruits  Heart:S1S2 RRR with soft 2/6 systolic murmur, gallup, rub or click Lungs:clear without rales, rhonchi, or wheezes OMB:TDHR, non tender, + BS, do not palpate liver spleen or masses Ext:no lower ext edema, 2+ pedal pulses, 2+ radial pulses Neuro:alert and oriented X 3, MAE, follows commands, + facial symmetry    Assessment/Plan Principal Problem:   Chest pain- agree with admit and rule out MI, she cannot remember if this is similar to previous angina.   May need anticoagulation, though advanced age may lead to bleed will discuss with cardiology. -- if enzymes + then add heparin.    Not on BB due to hx of bradycardia. Hx of statin allergy or intolerance. Per Dr. Allison Quarry note no plans for ischemic eval or aggressive treatment options though  this may change depending on her symptoms, may benefit from nuc if ongoing pain for now add imdur.    Active Problems:   CAD S/P percutaneous coronary angioplasty: BMS to RCA in 2004; DES to LAD with PTCA of D2 in 2007;  patent in 2010   Essential hypertension- elevated now with pain.    Urinary retention   Permanent atrial fibrillation without anticoagulation due to fall risk.    Dizziness- worse today.  She has decreased her maxizide per Dr. Roni Bread to help decrease dizziness.     Hyperlipidemia - labs followed by PCP, but statin intolerance- she does take omega 3 fatty acids.   Please not pt is a DNR.   Kerrick  Nurse Practitioner Certified [place] Pager [phone] or after 5pm or weekends call [phone] 01/06/2015, 4:24 PM  Patient seen, examined. Available data reviewed. Agree with findings, assessment, and plan as outlined by Cecilie Kicks, NP. This is a delightful elderly woman in no distress. She is somewhat frail appearing. Her lungs are clear. Heart is irregularly irregular. Jugular venous pressure is normal. There is no peripheral edema present. EKG shows atrial fibrillation with an age-indeterminate inferior infarct pattern and nonspecific ST/T-wave abnormality. The patient has had continuous chest discomfort now for 14 hours with normal troponins. I think she may have noncardiac chest pain. She has had some relief with sublingual nitroglycerin. We are also going to try a GI cocktail. I would not be inclined to proceed with an ischemic evaluation considering her advanced age and somewhat atypical features of chest pain. Our team will follow-up tomorrow to make sure that her chest pain syndrome is improving and that no further cardiac workup is needed.  Sherren Mocha, M.D. 01/06/2015 6:22 PM

## 2015-01-06 NOTE — ED Notes (Signed)
Patient transported to X-ray without distress.  

## 2015-01-06 NOTE — ED Notes (Signed)
Pain hurts worse with inspiration; 4/10 currently; performs ADLs by herself and lives independently with "help mopping and vacuuming". A&Ox3; no distress noted.

## 2015-01-06 NOTE — ED Notes (Signed)
Pt attempted to use bedside commode. No urine given.

## 2015-01-07 ENCOUNTER — Observation Stay (HOSPITAL_COMMUNITY): Payer: Commercial Managed Care - HMO

## 2015-01-07 DIAGNOSIS — R079 Chest pain, unspecified: Secondary | ICD-10-CM | POA: Diagnosis not present

## 2015-01-07 DIAGNOSIS — N189 Chronic kidney disease, unspecified: Secondary | ICD-10-CM | POA: Diagnosis present

## 2015-01-07 DIAGNOSIS — I482 Chronic atrial fibrillation: Secondary | ICD-10-CM | POA: Diagnosis not present

## 2015-01-07 DIAGNOSIS — I5032 Chronic diastolic (congestive) heart failure: Secondary | ICD-10-CM

## 2015-01-07 DIAGNOSIS — I10 Essential (primary) hypertension: Secondary | ICD-10-CM | POA: Diagnosis not present

## 2015-01-07 LAB — TROPONIN I: Troponin I: 0.04 ng/mL — ABNORMAL HIGH (ref <0.031)

## 2015-01-07 LAB — HEMOGLOBIN A1C
Hgb A1c MFr Bld: 6 % — ABNORMAL HIGH (ref 4.8–5.6)
Mean Plasma Glucose: 126 mg/dL

## 2015-01-07 MED ORDER — LOSARTAN POTASSIUM 25 MG PO TABS
25.0000 mg | ORAL_TABLET | Freq: Every day | ORAL | Status: DC
  Administered 2015-01-08: 25 mg via ORAL
  Filled 2015-01-07: qty 1

## 2015-01-07 MED ORDER — FUROSEMIDE 20 MG PO TABS
20.0000 mg | ORAL_TABLET | Freq: Every day | ORAL | Status: DC
Start: 2015-01-07 — End: 2015-01-08
  Administered 2015-01-07 – 2015-01-08 (×2): 20 mg via ORAL
  Filled 2015-01-07 (×2): qty 1

## 2015-01-07 NOTE — Evaluation (Signed)
Physical Therapy Evaluation Patient Details Name: [name] MRN: [id number] DOB: June 11, 1918 Today's Date: 01/07/2015   History of Present Illness  Very pleasant 81 year old woman with PMH of 2 vessel CAD s/p PCI in 2004 and 2007, TIA, renal artery stenosis, GERD, OA, and permanent AF (not anticoagulated) presenting with atypical chest pain.   Clinical Impression  Pt admitted with above diagnosis. Pt currently with functional limitations due to the deficits listed below (see PT Problem List). Pt able to ambulate with RW with min guard assist.  Daughter really wants pt to be able to go hhome.  Feel that with daughter's 24 hour initial assist, RW and HH f/u pt ok to go home when medically stable.   Pt will benefit from skilled PT to increase their independence and safety with mobility to allow discharge to the venue listed below.      Follow Up Recommendations Home health PT;Supervision/Assistance - 24 hour (HHOT, HHAide initially)    Equipment Recommendations  Rolling walker with 5" wheels (needs a RW for 5' 3 " height and more narrow if possible)    Recommendations for Other Services       Precautions / Restrictions Precautions Precautions: Fall Restrictions Weight Bearing Restrictions: No      Mobility  Bed Mobility Overal bed mobility: Needs Assistance Bed Mobility: Supine to Sit     Supine to sit: Min assist     General bed mobility comments: Needed assist for moving hips to EOb.   Transfers Overall transfer level: Needs assistance Equipment used: Rolling walker (2 wheeled) Transfers: Sit to/from Stand Sit to Stand: Min assist         General transfer comment: cues for hand placement  Ambulation/Gait Ambulation/Gait assistance: Min assist Ambulation Distance (Feet): 25 Feet Assistive device: Rolling walker (2 wheeled) Gait Pattern/deviations: Step-through pattern;Decreased stride length;Trunk flexed;Wide base of support;Antalgic   Gait velocity  interpretation: Below normal speed for age/gender General Gait Details: Pt able to ambulate with min guard assist with needs for stabilzing gait at times to steer RW and stay close to Rw.    Stairs            Wheelchair Mobility    Modified Rankin (Stroke Patients Only)       Balance Overall balance assessment: Needs assistance;History of Falls Sitting-balance support: No upper extremity supported;Feet supported Sitting balance-Leahy Scale: Fair     Standing balance support: Bilateral upper extremity supported;During functional activity Standing balance-Leahy Scale: Poor Standing balance comment: requires UE support.              High level balance activites: Direction changes;Turns;Sudden stops High Level Balance Comments: Needs min guard assist with challenges.              Pertinent Vitals/Pain Pain Assessment: Faces Faces Pain Scale: Hurts little more Pain Location: right hip Pain Descriptors / Indicators: Aching Pain Intervention(s): Limited activity within patient's tolerance;Monitored during session;Repositioned  VSS    Home Living Family/patient expects to be discharged to:: Private residence Living Arrangements: Alone Available Help at Discharge: Family;Available 24 hours/day Type of Home: House Home Access: Stairs to enter Entrance Stairs-Rails: Right;Left;Can reach both Entrance Stairs-Number of Steps: 8 Home Layout: One level Home Equipment: Walker - standard;Bedside commode;Shower seat;Wheelchair - manual;Hand held shower head Additional Comments: Daughter lives next door.  can help pt prn.    Prior Function Level of Independence: Independent with assistive device(s)               Hand  Dominance        Extremity/Trunk Assessment   Upper Extremity Assessment: Defer to OT evaluation           Lower Extremity Assessment: Generalized weakness      Cervical / Trunk Assessment: Kyphotic  Communication   Communication: No  difficulties  Cognition Arousal/Alertness: Awake/alert Behavior During Therapy: WFL for tasks assessed/performed Overall Cognitive Status: Within Functional Limits for tasks assessed                      General Comments      Exercises General Exercises - Lower Extremity Ankle Circles/Pumps: AROM;Both;10 reps;Seated Long Arc Quad: AROM;Both;10 reps;Seated      Assessment/Plan    PT Assessment Patient needs continued PT services  PT Diagnosis Generalized weakness   PT Problem List Decreased balance;Decreased activity tolerance;Decreased mobility;Decreased knowledge of use of DME;Decreased safety awareness;Decreased knowledge of precautions;Decreased strength;Pain  PT Treatment Interventions DME instruction;Gait training;Functional mobility training;Therapeutic activities;Therapeutic exercise;Stair training;Balance training;Patient/family education   PT Goals (Current goals can be found in the Care Plan section) Acute Rehab PT Goals Patient Stated Goal: to go home PT Goal Formulation: With patient Time For Goal Achievement: 01/21/15 Potential to Achieve Goals: Good    Frequency Min 3X/week   Barriers to discharge        Co-evaluation               End of Session Equipment Utilized During Treatment: Gait belt Activity Tolerance: Patient limited by fatigue Patient left: with call bell/phone within reach;in chair;with family/visitor present (pt bathing and NT notified to come assist pt with finishing) Nurse Communication: Mobility status (NT to come assist with finishing bath. )    Functional Assessment Tool Used: clinical judgment Functional Limitation: Mobility: Walking and moving around Mobility: Walking and Moving Around Current Status (Z6109): At least 20 percent but less than 40 percent impaired, limited or restricted Mobility: Walking and Moving Around Goal Status [phone]): At least 1 percent but less than 20 percent impaired, limited or restricted     Time: 0981-1914 PT Time Calculation (min) (ACUTE ONLY): 23 min   Charges:   PT Evaluation $Initial PT Evaluation Tier I: 1 Procedure PT Treatments $Gait Training: 8-22 mins   PT G Codes:   PT G-Codes **NOT FOR INPATIENT CLASS** Functional Assessment Tool Used: clinical judgment Functional Limitation: Mobility: Walking and moving around Mobility: Walking and Moving Around Current Status (N8295): At least 20 percent but less than 40 percent impaired, limited or restricted Mobility: Walking and Moving Around Goal Status [phone]): At least 1 percent but less than 20 percent impaired, limited or restricted    Berline Lopes 01/07/2015, 4:56 PM  [facility] Acute Rehabilitation [phone] (pager)

## 2015-01-07 NOTE — Progress Notes (Signed)
[facility] PROGRESS NOTE  [name] ZOX:[id number] DOB: Mar 25, 1918 DOA: 01/06/2015 PCP: [name], MD  Assessment/Plan: Atypical chest pain Troponin peaked at 0.04.. EKG unremarkable. Musculoskeletal vs mild volume overload. Now resolved. - continue Plavix -appreciate cardiology evaluation. Started on low dose lasix for mild  volume overload. Pending 2D echo results.    CAD  S/P percutaneous coronary angioplasty: BMS to RCA in 2004; DES to LAD with PTCA of D2 in 2007; patent in 2010 -Continue Plavix, Not on beta blockers reportedly due to history of bradycardia  Paroxysmal atrial fibrillation -Rate controlled, not on anticoagulation likely due to advanced age    prediabetes  A1C of 6. Monitor on SSI. Will not add any meds   Hypertension -Stable. continue Cozaar. ( dose reduced after adding lasix)  Code Status: DNR Family Communication: daughter at bedside Disposition Plan: home possibly tomorrow   Consultants:  CARDIOLOGY  Procedures:  2D echo  Antibiotics:  none  HPI/Subjective: Reports some shortness of breath, no further chest discomfort  Objective: Filed Vitals:   01/07/15 1343  BP: 133/66  Pulse: 91  Temp: 98.8 F (37.1 C)  Resp: 20    Intake/Output Summary (Last 24 hours) at 01/07/15 1402 Last data filed at 01/07/15 1344  Gross per 24 hour  Intake    360 ml  Output    750 ml  Net   -390 ml   Filed Weights   01/06/15 1709 01/07/15 0540  Weight: 61.598 kg (135 lb 12.8 oz) 62.234 kg (137 lb 3.2 oz)    Exam:   General:  elderly female in NAD   HEENT: no pallor, moist mucosa  Chest: clear b/l, no added sounds   CVS: N S1&S2, no murmurs  GI: soft, NT, ND, BS+  Musculoskeletal: warm, no edema   CNS: alert and oriented  Data Reviewed: Basic Metabolic Panel:  Recent Labs Lab 01/06/15 1348  NA 144  K 3.7  CL 109  CO2 25  GLUCOSE 135*  BUN 18  CREATININE 1.19*  CALCIUM 11.1*   Liver Function  Tests:  Recent Labs Lab 01/06/15 1348  AST 21  ALT 12*  ALKPHOS 48  BILITOT 1.1  PROT 7.4  ALBUMIN 3.9   No results for input(s): LIPASE, AMYLASE in the last 168 hours. No results for input(s): AMMONIA in the last 168 hours. CBC:  Recent Labs Lab 01/06/15 1348  WBC 11.2*  NEUTROABS 9.4*  HGB 12.4  HCT 37.5  MCV 98.2  PLT 134*   Cardiac Enzymes:  Recent Labs Lab 01/06/15 1725 01/06/15 2228 01/07/15 0532  TROPONINI 0.03 0.03 0.04*   BNP (last 3 results) No results for input(s): BNP in the last 8760 hours.  ProBNP (last 3 results) No results for input(s): PROBNP in the last 8760 hours.  CBG: No results for input(s): GLUCAP in the last 168 hours.  No results found for this or any previous visit (from the past 240 hour(s)).   Studies: Dg Chest 2 View  01/06/2015   CLINICAL DATA:  Midsternal chest pain with resolution following sublingual nitroglycerin  EXAM: CHEST - 2 VIEW  COMPARISON:  11/27/2011  FINDINGS: Cardiac shadow is mildly enlarged in size. The lungs are well aerated bilaterally with mild interstitial changes. These are stable from previous exams. No focal infiltrate is seen. Compression deformity is noted in the lower thoracic spine stable from 2013.  IMPRESSION: No acute abnormality noted.   Electronically Signed   By: Alcide Clever M.D.   On: 01/06/2015 13:44  Scheduled Meds: . clopidogrel  75 mg Oral Daily  . enoxaparin (LOVENOX) injection  40 mg Subcutaneous Q24H  . isosorbide mononitrate  15 mg Oral Daily  . losartan  50 mg Oral Daily  . mirtazapine  15 mg Oral QHS  . multivitamin with minerals  1 tablet Oral Daily  . pantoprazole  40 mg Oral Daily   Continuous Infusions:     Time spent: 25 minutes    Kara Melching  [facility] Pager [phone]. If 7PM-7AM, please contact night-coverage at [email or web address], password [email or web address] 01/07/2015, 2:02 PM

## 2015-01-07 NOTE — Progress Notes (Signed)
  Echocardiogram 2D Echocardiogram has been performed.  Delcie Roch 01/07/2015, 2:33 PM

## 2015-01-07 NOTE — Progress Notes (Addendum)
Patient Profile: Very pleasant 81 year old woman with PMH of 2 vessel CAD s/p PCI in 2004 and 2007, TIA, renal artery stenosis, GERD, OA, and permanent AF (not anticoagulated) presenting with atypical chest pain.   Subjective: Feeling well today. Her chest pain now only occurs with deep inspiration, whereas yesterday it was continuous. She also reports a congested, non-productive cough. No dyspnea on exertion or at rest. Appetite is good. She is able to lay flat without distress.   Objective: Vital signs in last 24 hours: Temp:  [98.2 F (36.8 C)-98.7 F (37.1 C)] 98.2 F (36.8 C) (06/17 0540) Pulse Rate:  [56-106] 73 (06/17 0726) Resp:  [16-22] 22 (06/17 0726) BP: (117-166)/(52-88) 134/62 mmHg (06/17 0726) SpO2:  [93 %-100 %] 96 % (06/17 0726) Weight:  [61.598 kg (135 lb 12.8 oz)-62.234 kg (137 lb 3.2 oz)] 62.234 kg (137 lb 3.2 oz) (06/17 0540) Last BM Date: 01/05/15  Intake/Output from previous day: 06/16 0701 - 06/17 0700 In: -  Out: 150 [Urine:150] Intake/Output this shift: Total I/O In: -  Out: 200 [Urine:200]  Medications Current Facility-Administered Medications  Medication Dose Route Frequency Provider Last Rate Last Dose  . acetaminophen (TYLENOL) tablet 650 mg  650 mg Oral Q6H PRN Zannie Cove, MD       Or  . acetaminophen (TYLENOL) suppository 650 mg  650 mg Rectal Q6H PRN Zannie Cove, MD      . acetaminophen (TYLENOL) tablet 1,000 mg  1,000 mg Oral Q6H PRN Zannie Cove, MD   1,000 mg at 01/06/15 2053  . ALPRAZolam Prudy Feeler) tablet 0.25 mg  0.25 mg Oral QHS PRN Zannie Cove, MD      . clopidogrel (PLAVIX) tablet 75 mg  75 mg Oral Daily Zannie Cove, MD      . enoxaparin (LOVENOX) injection 40 mg  40 mg Subcutaneous Q24H Zannie Cove, MD   40 mg at 01/06/15 1835  . gi cocktail (Maalox,Lidocaine,Donnatal)  30 mL Oral PRN Leone Brand, NP   30 mL at 01/06/15 1835  . guaifenesin (ROBITUSSIN) 100 MG/5ML syrup 200 mg  200 mg Oral Q4H PRN Zannie Cove,  MD      . isosorbide mononitrate (IMDUR) 24 hr tablet 15 mg  15 mg Oral Daily Leone Brand, NP   15 mg at 01/06/15 1835  . losartan (COZAAR) tablet 50 mg  50 mg Oral Daily Zannie Cove, MD      . mirtazapine (REMERON) tablet 15 mg  15 mg Oral QHS Zannie Cove, MD   15 mg at 01/06/15 2235  . multivitamin with minerals tablet 1 tablet  1 tablet Oral Daily Zannie Cove, MD      . ondansetron [facility]) tablet 4 mg  4 mg Oral Q6H PRN Zannie Cove, MD       Or  . ondansetron (ZOFRAN) injection 4 mg  4 mg Intravenous Q6H PRN Zannie Cove, MD      . pantoprazole (PROTONIX) EC tablet 40 mg  40 mg Oral Daily Zannie Cove, MD      . temazepam (RESTORIL) capsule 30 mg  30 mg Oral QHS PRN Zannie Cove, MD   30 mg at 01/06/15 2235    PE: General appearance: alert, cooperative, appears stated age and no distress Neck: no adenopathy, no carotid bruit, no JVD, supple, symmetrical, trachea midline and thyroid not enlarged, symmetric, no tenderness/mass/nodules Lungs: rales LLL, RLL and R>L Heart: irregularly irregular rhythm, S1: normal, S2: normal and systolic murmur: late systolic 2/6, crescendo  at 2nd right intercostal space, radiates to carotids Abdomen: soft, non-tender; bowel sounds normal; no masses,  no organomegaly Extremities: extremities normal, atraumatic, no cyanosis or edema Pulses: 2+ and symmetric Skin: Skin color, texture, turgor normal. No rashes or lesions Neurologic: Grossly normal  Lab Results:   Recent Labs  01/06/15 1348  WBC 11.2*  HGB 12.4  HCT 37.5  PLT 134*   BMET  Recent Labs  01/06/15 1348  NA 144  K 3.7  CL 109  CO2 25  GLUCOSE 135*  BUN 18  CREATININE 1.19*  CALCIUM 11.1*   Cardiac Enzymes 01/06/15 @ 1725: 0.03 01/06/15 @ 2228: 0.03  01/07/15 @ 0532: 0.04  Studies/Results: 01/06/15 CXR: Cardiac shadow is mildly enlarged in size. The lungs are well aerated bilaterally with mild interstitial changes. These are stable from previous exams. No  focal infiltrate is seen. Compression deformity is noted in the lower thoracic spine stable from 2013.  Assessment/Plan Principal Problem:   Chest pain Active Problems:   Essential hypertension   CAD S/P percutaneous coronary angioplasty: BMS to RCA in 2004; DES to LAD with PTCA of D2 in 2007;  patent in 2010   Urinary retention   Permanent atrial fibrillation   Chronic diastolic heart failure   Chronic kidney disease stage III   Hypercalcemia   1. Atypical chest pain with known 2 vessel CAD:  - Pain severity has decreased today and now only occurs with inspiration  - Troponin increased to 0.4 today from 0.3, overall flat - ECG with no acute changes - Low suspicion for ACS - No hypotension or headache with Imdur - Continue Plavix 75 mg and Imdur 15 mg daily.  - No BB due to bradycardia - Reduce Losartan to 25 mg daily with addition of Lasix  2. Suspected acute on chronic diastolic CHF: - Hypervolemic on exam; having pain/tightness with deep inspiration  - Weight today is 1 kg above her baseline - Echo pending; last echo in 2015 showed EF 60-65% with elevated PAP. No comment on diastolic function - Was previously on HCTZ. This was discontinued in 10/2014 for dizziness. - Start Lasix 20 mg PO daily  3. HTN: - Well controlled on current regimen.  - Reduce Losartan to 25 mg daily with addition of Lasix  4. Permanent AF: - Rate well controlled, 60s-80s. Prior hx of bradycardia.  - No anticoagulation due to advanced age and high risk for falls.   5. CKD stage III: - Creatinine is above baseline (0.8-1.0) at 1.19 - Consider post-renal causes due to urinary retention.   6. Hypercalcemia: - Ca 11.1 which is new - Consider evaluating serum PTH and vitamin D   Brittainy M. Delmer Islam 01/07/2015 8:47 AM  Patient seen and examined. Agree with assessment and plan. AF rate is controlled. Suspect noncardiac chest pain. Mild discomfort with deep inspiration. No rub on exam .  Observe today; prob ok to dc tomorrow.   Lennette Bihari, MD, [facility] 01/07/2015 11:52 AM

## 2015-01-07 NOTE — Progress Notes (Signed)
Pt compliant of right hip pain, Dr. Gonzella Lex notified and orders received to obtain xray of right hip.  Will continue to monitor closely.

## 2015-01-08 DIAGNOSIS — R079 Chest pain, unspecified: Secondary | ICD-10-CM | POA: Diagnosis not present

## 2015-01-08 DIAGNOSIS — I5032 Chronic diastolic (congestive) heart failure: Secondary | ICD-10-CM | POA: Diagnosis not present

## 2015-01-08 DIAGNOSIS — I10 Essential (primary) hypertension: Secondary | ICD-10-CM | POA: Diagnosis not present

## 2015-01-08 MED ORDER — FUROSEMIDE 20 MG PO TABS: 20.0000 mg | ORAL_TABLET | Freq: Every day | ORAL | Status: DC

## 2015-01-08 MED ORDER — LOSARTAN POTASSIUM 50 MG PO TABS: 25.0000 mg | ORAL_TABLET | Freq: Every day | ORAL | Status: DC

## 2015-01-08 MED ORDER — ALUMINUM-MAGNESIUM-SIMETHICONE 200-200-20 MG/5ML PO SUSP: 15.0000 mL | Freq: Four times a day (QID) | ORAL | Status: DC | PRN

## 2015-01-08 MED ORDER — ISOSORBIDE MONONITRATE ER 30 MG PO TB24: 15.0000 mg | ORAL_TABLET | Freq: Every day | ORAL | Status: DC

## 2015-01-08 NOTE — Progress Notes (Signed)
Patient being discharged by Internal Medicine, has a previously arranged followup with Dr. Herbie Baltimore.  Ramond Dial PA Pager: [phone]

## 2015-01-08 NOTE — Discharge Summary (Signed)
Physician Discharge Summary  [name] ZOX:[id number] DOB: May 30, 1918 DOA: 01/06/2015  PCP: Diane Blamer, MD  Admit date: 01/06/2015 Discharge date: 01/08/2015  Time spent:  25 minutes  Recommendations for Outpatient Follow-up:    Discharge Diagnoses:  Principal Problem:   Chest pain   Active Problems:   Essential hypertension   CAD S/P percutaneous coronary angioplasty: BMS to RCA in 2004; DES to LAD with PTCA of D2 in 2007;  patent in 2010   Permanent atrial fibrillation   Chronic diastolic heart failure   Chronic kidney disease stage III   Hypercalcemia   Discharge Condition: fair  Diet recommendation: heart healthy  Filed Weights   01/06/15 1709 01/07/15 0540  Weight: 61.598 kg (135 lb 12.8 oz) 62.234 kg (137 lb 3.2 oz)    History of present illness:  81 y.o. female chief with past medical history of CAD status post BMS to RCA in 2004 and DES to LAD in 2007, TIA, renal artery stenosis, paroxysmal A. fib not on anticoagulation, GERD, peptic ulcer disease, osteoarthritis, is independent and lives at home by herself presents to the ER with the above complaints. She reports waking up this morning with substernal chest pain, which radiated to her back and her neck she took a sublingual nitroglycerin tablet felt a little better but the chest pain recurred and hence took 2 more in a 3 hour span. She also reports having some dyspnea and acute and chronic dizziness. She also has a mild cough which she thinks is somewhat chronic at this point, and could be related to allergies. Subsequently called Dr. Elissa Hefty office and was referred to the ER. EKG without acute ST changes, troponin 1 negative  Hospital Course:  Atypical chest pain EKG unremarkable. -Third set of troponin peaked at 0.04 but remained patient remained asymptomatic and stable on telemetry. Possibly some component of fluid overload versus GERD.. - continued Plavix. Added Maalox. -Appreciate cardiology  consult. Placed on low-dose Lasix with clinical improvement. Will discharge her on Lasix and lower dose of losartan. Also added low-dose Imdur. -She does not have further symptoms and can be discharged home with outpatient follow-up.    CAD S/P percutaneous coronary angioplasty: BMS to RCA in 2004; DES to LAD with PTCA of D2 in 2007; patent in 2010 -Continue Plavix, Not on beta blockers reportedly due to history of bradycardia  Paroxysmal atrial fibrillation -Rate controlled, not on anticoagulation likely due to advanced age   Hyperglycemia with prediabetes A1c of 6. FSG stable. No treatment required.   Hypertension -Reduce dose of Cozaar now that she will be discharged on low-dose Lasix. Will discontinue Maxide.  Right hip pain X-ray done without any acute injury. Stable hip prostheses. Instructed to take Tylenol when necessary.  Generalized weakness  was seen by physical therapy and recommended rolling walker and home health with 24-hour supervision. Daughter lives nearby and stays with her as needed. Ordered home health.  Hypercalcemia Mild. Follow-up as outpatient.      Code Status:DNR  Family Communication: daughter at bedside. Updated on the phone.   Discharge Exam: Filed Vitals:   01/08/15 0500  BP: 133/51  Pulse: 79  Temp: 98.8 F (37.1 C)  Resp: 18    General: elderly female in NAD  HEENT: no pallor, moist mucosa, supple neck  Chest: clear b/l, no added sounds  CVS: N S1&S2, no murmurs  GI: soft, NT, ND, BS+  Musculoskeletal: warm, no edema  CNS: alert and oriented  Discharge Instructions    Current Discharge  Medication List    START taking these medications   Details  aluminum-magnesium hydroxide-simethicone (MAALOX) 200-200-20 MG/5ML SUSP Take 15 mLs by mouth every 6 (six) hours as needed (indigestion, heartburn). Qty: 355 mL, Refills: 0    furosemide (LASIX) 20 MG tablet Take 1 tablet (20 mg total) by mouth daily. Qty: 30 tablet,  Refills: 0    isosorbide mononitrate (IMDUR) 30 MG 24 hr tablet Take 0.5 tablets (15 mg total) by mouth daily. Qty: 30 tablet, Refills: 0      CONTINUE these medications which have CHANGED   Details  losartan (COZAAR) 50 MG tablet Take 0.5 tablets (25 mg total) by mouth daily. Qty: 30 tablet, Refills: 0      CONTINUE these medications which have NOT CHANGED   Details  acetaminophen (TYLENOL) 500 MG tablet Take 1,000 mg by mouth every 6 (six) hours as needed for mild pain.    ALPRAZolam (XANAX) 0.25 MG tablet Take 0.25 mg by mouth at bedtime as needed for anxiety.    calcium gluconate 500 MG tablet Take 500 mg by mouth 2 (two) times daily.     Cholecalciferol (VITAMIN D-3 PO) Take 1,000 mg by mouth daily.     clopidogrel (PLAVIX) 75 MG tablet Take 75 mg by mouth daily.    docusate sodium (COLACE) 100 MG capsule Take 100 mg by mouth 2 (two) times daily.      fish oil-omega-3 fatty acids 1000 MG capsule Take 1 g by mouth 2 (two) times daily.     folic acid (FOLVITE) 1 MG tablet Take 1 mg by mouth daily.      mirtazapine (REMERON) 15 MG tablet Take 15 mg by mouth at bedtime.    Multiple Vitamin (MULITIVITAMIN WITH MINERALS) TABS Take 1 tablet by mouth daily.    pantoprazole (PROTONIX) 40 MG tablet Take 40 mg by mouth daily.     temazepam (RESTORIL) 30 MG capsule Take 30 mg by mouth at bedtime.      STOP taking these medications     triamterene-hydrochlorothiazide (MAXZIDE-25) 37.5-25 MG per tablet        No Known Allergies Follow-up Information    Follow up with Diane Blamer, MD. Schedule an appointment as soon as possible for a visit in 2 weeks.   Specialty:  Family Medicine   Contact information:   [phone] [address] [place] [address] [phone]        The results of significant diagnostics from this hospitalization (including imaging, microbiology, ancillary and laboratory) are listed below for reference.    Significant Diagnostic  Studies: Dg Chest 2 View  01/06/2015   CLINICAL DATA:  Midsternal chest pain with resolution following sublingual nitroglycerin  EXAM: CHEST - 2 VIEW  COMPARISON:  11/27/2011  FINDINGS: Cardiac shadow is mildly enlarged in size. The lungs are well aerated bilaterally with mild interstitial changes. These are stable from previous exams. No focal infiltrate is seen. Compression deformity is noted in the lower thoracic spine stable from 2013.  IMPRESSION: No acute abnormality noted.   Electronically Signed   By: Alcide Clever M.D.   On: 01/06/2015 13:44   Dg Hip Unilat With Pelvis 1v Right  01/07/2015   CLINICAL DATA:  Pain.  No recent trauma  EXAM: RIGHT HIP (WITH PELVIS)2-3 VIEW  COMPARISON:  July 29, 2008  FINDINGS: Frontal pelvis as well as frontal and lateral right hip images were obtained. There are total hip prostheses bilaterally with prosthetic components appearing well-seated. No acute fracture  or dislocation. Heterotopic bone is adjacent to the greater trochanter on the right, stable. Bones are diffusely osteoporotic.  IMPRESSION: Total hip prostheses, appearing well-seated. No acute fracture or dislocation. Bones diffusely osteoporotic.   Electronically Signed   By: Bretta Bang III M.D.   On: 01/07/2015 20:25    Microbiology: No results found for this or any previous visit (from the past 240 hour(s)).   Labs: Basic Metabolic Panel:  Recent Labs Lab 01/06/15 1348  NA 144  K 3.7  CL 109  CO2 25  GLUCOSE 135*  BUN 18  CREATININE 1.19*  CALCIUM 11.1*   Liver Function Tests:  Recent Labs Lab 01/06/15 1348  AST 21  ALT 12*  ALKPHOS 48  BILITOT 1.1  PROT 7.4  ALBUMIN 3.9   No results for input(s): LIPASE, AMYLASE in the last 168 hours. No results for input(s): AMMONIA in the last 168 hours. CBC:  Recent Labs Lab 01/06/15 1348  WBC 11.2*  NEUTROABS 9.4*  HGB 12.4  HCT 37.5  MCV 98.2  PLT 134*   Cardiac Enzymes:  Recent Labs Lab 01/06/15 1725  01/06/15 2228 01/07/15 0532  TROPONINI 0.03 0.03 0.04*   BNP: BNP (last 3 results) No results for input(s): BNP in the last 8760 hours.  ProBNP (last 3 results) No results for input(s): PROBNP in the last 8760 hours.  CBG: No results for input(s): GLUCAP in the last 168 hours.     SignedEddie North  [facility] 01/08/2015, 10:12 AM

## 2015-01-08 NOTE — Care Management Note (Signed)
Case Management Note  Patient Details  Name: [name] MRN: [id number] Date of Birth: 12/19/1917  Subjective/Objective:                   Chief Complaint: chest pain  Action/Plan:  Discharge planning Expected Discharge Date:                  Expected Discharge Plan:  Home w Home Health Services  In-House Referral:     Discharge planning Services  CM Consult  Post Acute Care Choice:  Home Health Choice offered to:     DME Arranged:  Walker rolling DME Agency:  [facility]  HH Arranged:  PT, OT, Nurse's Aide HH Agency:  [facility]  Status of Service:  Completed, signed off  Medicare Important Message Given:    Date Medicare IM Given:    Medicare IM give by:    Date Additional Medicare IM Given:    Additional Medicare Important Message give by:     If discussed at Long Length of Stay Meetings, dates discussed:    Additional Comments: CM met with pt in room to offer choice of home health agency.  Pt chooses [facility] to render HHPT/OT/Aide.  Address and contact information verified by pt and then confirmed with daughter of pt, Joyce Ellison [phone] or [phone].  Both pt and Joyce feel it is best to have the primary contact be Joyce for scheduling of HH services.  Referral called to [facility] rep, Tiffany.  CM called [facility] DME rep, James to lease deliver the rolling walker so pt can discharge.  No other CM needs were communicated. Jeffries, Sarah Christine, RN 01/08/2015, 11:10 AM  

## 2015-01-08 NOTE — Progress Notes (Signed)
Discharged home to have [facility] services set up with [facility]. Walker delivered to room prior to D/C. Daughter present at the bedside to transport home.

## 2015-01-08 NOTE — Progress Notes (Signed)
At 0055 patient called out and stated she had slid to the floor. Oncoming nurse and off going nurse went in to room and patient was sitting on floor beside of bed. Patient stated she was getting up to use the bedside commode and her feet slipped and she slid to the floor, states she did not hit hard, basically she sat down. Patient denied any pain, denied hitting head . Vital signs b/p= 132/82,HR=88,resp 18,o2 sats 97% on r/a. Lenny Pastel NP notified of patient's fall with no new orders, Patient is neuro intact and will continue to monitor. Patient states she does not wish to have her family notified. Peri Jefferson

## 2015-01-13 ENCOUNTER — Encounter: Payer: Self-pay | Admitting: Cardiology

## 2015-01-13 ENCOUNTER — Ambulatory Visit (INDEPENDENT_AMBULATORY_CARE_PROVIDER_SITE_OTHER): Payer: Commercial Managed Care - HMO | Admitting: Cardiology

## 2015-01-13 VITALS — BP 130/70 | HR 80 | Ht 64.0 in | Wt 133.0 lb

## 2015-01-13 DIAGNOSIS — I2510 Atherosclerotic heart disease of native coronary artery without angina pectoris: Secondary | ICD-10-CM | POA: Diagnosis not present

## 2015-01-13 DIAGNOSIS — R42 Dizziness and giddiness: Secondary | ICD-10-CM

## 2015-01-13 DIAGNOSIS — I482 Chronic atrial fibrillation: Secondary | ICD-10-CM

## 2015-01-13 DIAGNOSIS — I4821 Permanent atrial fibrillation: Secondary | ICD-10-CM

## 2015-01-13 DIAGNOSIS — E785 Hyperlipidemia, unspecified: Secondary | ICD-10-CM

## 2015-01-13 DIAGNOSIS — I5032 Chronic diastolic (congestive) heart failure: Secondary | ICD-10-CM

## 2015-01-13 DIAGNOSIS — Z9861 Coronary angioplasty status: Secondary | ICD-10-CM

## 2015-01-13 DIAGNOSIS — I10 Essential (primary) hypertension: Secondary | ICD-10-CM

## 2015-01-13 NOTE — Patient Instructions (Signed)
  Change when you take FUROSEMIDE ( LASIX ) - START TAKING MEDICATIONS EVERY Tuesday AND FRIDAYS ( TWICE A WEEK) YOU CAN USE AS NEEDED FOR SHORTNESS OF BREATH AND SWELLING.  Isosorbide mn take in the evening time -(heart)  Your physician wants you to follow-up in OCT 2016 DR HARDING 30 -MIN APPOINTMENT. You will receive a reminder letter in the mail two months in advance. If you don't receive a letter, please call our office to schedule the follow-up appointment.

## 2015-01-13 NOTE — Progress Notes (Signed)
PCP: [name], MD  Clinic Note: Chief Complaint  Patient presents with  . Follow-up    Hospital follow-up Patient has felt light headed, dizzy, has had SOB.  Marland Kitchen Coronary Artery Disease    HPI: [name] is a 81 y.o. female with a history of the 2 vessel CAD status post PCI of the LAD and RCA as well as PTCA of a diagonal branch. She also has known renal artery stenosis status post stenting. She has had normal LV function on echocardiogram. I last saw her in April of this year. She today for Hospital Follow-up. Admitted to [facility] over the weekend for an episode of substernal chest pain. She reported waking with substernal chest pain, which radiated to her back and her neck she took a sublingual nitroglycerin tablet felt a little better but the chest pain recurred and hence took 2 more in a 3 hour span. She also reported having some dyspnea and acute and chronic dizziness.  R/o MI.  Thought to be be related to volume overload -- d/c w/ Lasix & Imdur adde & Losartan reduced.  Has been having loose stools. PCP also concerned re: ? PNA/Bronchitis --> started Amoxicillin.  Past Medical History  Diagnosis Date  . CAD S/P percutaneous coronary angioplasty 2004    ZETA BMS 4.0 mm X 15 mm in RCA - 2004; CYPHER DES 2.75mm x 18 mm to LAD (with PTCA of jailed D2 with ostial 85%)& 2 in 2007  . TIA (transient ischemic attack)   . Renal artery stenosis, native 2003-2004    S/P R Renal A Stent 5 mm x 12 mm  . Hypertension     H/o Renovascular HTN, s/p R RA Stent in 06/2003  . Hyperlipidemia   . Diverticulosis   . GERD (gastroesophageal reflux disease)     TIA  . PUD (peptic ulcer disease)     Gastric Ulcer  . Gastric outlet obstruction 2012    remote -- s/p Vagotomy & Pyeloroplasty  . PAF (paroxysmal atrial fibrillation) April 2015    Newly diagnosed by PCP  . Multiple allergies   . Anxiety   . Depression   . History of blood transfusion     With Hip Surgery  . Cataract    removed bil eyes  . Osteoporosis     s/p L hip Fxr - Hemiarthroplasty  . Arthritis     R Hip total Arthroplasty & L Hip Hemiarthroplasty    Prior Cardiac Evaluation and Past Surgical History: Past Surgical History  Procedure Laterality Date  . Vagotomy and pyloroplasty  01/13/04  . Replacement total hip w/  resurfacing implants Right 04/2002  . Renal artery stent  07/22/2003    Cordis 5x46mm stent to right renal artery (Dr. Jonette Eva)  . Tonsillectomy    . Cataract extraction Bilateral   . Colonoscopy    . Joint replacement    . Hip arthroplasty  11/26/2011    Procedure: ARTHROPLASTY BIPOLAR HIP;  Surgeon: Javier Docker, MD;  Location: [facility] OR;  Service: Orthopedics;  Laterality: Left;  . Nm myocar perf wall motion  08/2007    persantine myoview - normal pattern of perfusion, EF 80%, low risk scan  . Percutaneous coronary stent intervention (pci-s)  06/24/2003    ACS Zeta BMS 4.0x69mm to RCA (Dr. Jonette Eva)  . Percutaneous coronary stent intervention (pci-s)  11/13/2005    Cypher DES 2.5x13mm to LAD; Cutting PTCA of Ostial D2 (jailed) (Dr. Jonette Eva)  . Transthoracic echocardiogram  11/23/2013; 01/07/2015:    a) EF 55-60%, no regional WMA; MAC without MR;; b) normal LV size and thickness. EF 50-55%. No RWMA, mild LA dilation with mod PR  . Cardiac catheterization  01/17/2009    normal L main, normal LAD, normal L Cfx, RCA with patent stent (Dr. Erlene Quan)  . Coronary angioplasty with stent placement  2007    hx/notes 01/06/2015   Studies Reviewed:   Echo 6/17: - Left ventricle: The cavity size was normal. Wall thickness was normal. Systolic function was normal. EF 50% to 55%. Wall motion was normal; there were no regional wall motion abnormalities. - Mitral valve: Severely calcified annulus. Valve area =~ 2.24 cm^2. - Left atrium: The atrium was mildly dilated. - Pulmonic valve: There was moderate regurgitation. - Pulmonary arteries: Systolic pressure was mildly increased. PA  peak pressure: 34 mm Hg (S).  Interval History: [name] presents today for follow-up of her recent hospitalization when she was started on diuretic. She notes feeling dizzy and lightheaded. Not as much short of breath that she had been now that she's been on in a box but still noticing some exertional dyspnea. She more so as noticing this till having some GI being on the anastomotic. Taking probiotics and that is helped. No further anginal or other type of chest pain requiring nitroglycerin. She is pretty much wheelchair bound and therefore gets some moderate amount of dyspnea with any exertion likely due to deconditioning. That is a little bit worse now that have been at baseline but is much better than the hospital visit. Despite being dizzy and somewhat off balance she is not had any syncope or near syncopal type episodes. No TIA or amaurosis fugax. She has felt somewhat run down and worn out. Denies any recent fevers or chills.  ROS: A comprehensive was performed. Review of Systems  Constitutional: Positive for malaise/fatigue. Negative for fever and chills.  Respiratory: Positive for cough and shortness of breath.   Gastrointestinal: Positive for nausea. Negative for blood in stool and melena.  Genitourinary: Negative for hematuria and flank pain.  Musculoskeletal: Positive for back pain and joint pain.  Neurological: Positive for dizziness and loss of consciousness.  Endo/Heme/Allergies: Bruises/bleeds easily.  Psychiatric/Behavioral: Negative.  Negative for depression.  All other systems reviewed and are negative.   Current Outpatient Prescriptions on File Prior to Visit  Medication Sig Dispense Refill  . acetaminophen (TYLENOL) 500 MG tablet Take 1,000 mg by mouth every 6 (six) hours as needed for mild pain.    Marland Kitchen ALPRAZolam (XANAX) 0.25 MG tablet Take 0.25 mg by mouth at bedtime as needed for anxiety.    Marland Kitchen aluminum-magnesium hydroxide-simethicone (MAALOX) 200-200-20 MG/5ML SUSP Take 15  mLs by mouth every 6 (six) hours as needed (indigestion, heartburn). 355 mL 0  . calcium gluconate 500 MG tablet Take 500 mg by mouth 2 (two) times daily.     . Cholecalciferol (VITAMIN D-3 PO) Take 1,000 mg by mouth daily.     . clopidogrel (PLAVIX) 75 MG tablet Take 75 mg by mouth daily.    Marland Kitchen docusate sodium (COLACE) 100 MG capsule Take 100 mg by mouth 2 (two) times daily.      . fish oil-omega-3 fatty acids 1000 MG capsule Take 1 g by mouth 2 (two) times daily.     . folic acid (FOLVITE) 1 MG tablet Take 1 mg by mouth daily.      . furosemide (LASIX) 20 MG tablet Take 1 tablet (20 mg total) by mouth  daily. 30 tablet 0  . isosorbide mononitrate (IMDUR) 30 MG 24 hr tablet Take 0.5 tablets (15 mg total) by mouth daily. 30 tablet 0  . losartan (COZAAR) 50 MG tablet Take 0.5 tablets (25 mg total) by mouth daily. 30 tablet 0  . mirtazapine (REMERON) 15 MG tablet Take 15 mg by mouth at bedtime.    . Multiple Vitamin (MULITIVITAMIN WITH MINERALS) TABS Take 1 tablet by mouth daily.    . pantoprazole (PROTONIX) 40 MG tablet Take 40 mg by mouth daily.     . temazepam (RESTORIL) 30 MG capsule Take 30 mg by mouth at bedtime.     No current facility-administered medications on file prior to visit.   No Known Allergies  History  Substance Use Topics  . Smoking status: Never Smoker   . Smokeless tobacco: Never Used  . Alcohol Use: No   family history includes Diabetes in her son; Heart disease in her brother and sister; Hypertension in her father and mother; Stroke in her father and mother; Uterine cancer in her sister. There is no history of Esophageal cancer, Colon cancer, or Stomach cancer.   Wt Readings from Last 3 Encounters:  01/13/15 60.328 kg (133 lb)  01/07/15 62.234 kg (137 lb 3.2 oz)  11/15/14 61.1 kg (134 lb 11.2 oz)    PHYSICAL EXAM BP 130/70 mmHg  Pulse 80  Ht  (1.626 m)  Wt 60.328 kg (133 lb)  BMI 22.82 kg/m2 General appearance: alert, cooperative, appears stated age,  no distress. She has significant thoracic kyphoscoliosis; she is mildly frail, but vibrant mentally. HEENT: [place]/AT, EOMI, MMM, anicteric sclera - with post-cataract surgical findings Neck: no adenopathy, no carotid bruit, no JVD, supple, symmetrical, trachea midline and thyroid not enlarged, symmetric, no tenderness/mass/nodules Lungs: clear to auscultation bilaterally, normal percussion bilaterally and Nonlabored, good air movement Heart: RRR, normal S1 with a soft S4. Nondisplaced PMI. 1-2/6 early peaking C-D SEM at RUSB radiating to carotids. No other rubs or gallops Abdomen: soft, non-tender; bowel sounds normal; no masses, no organomegaly Extremities: extremities normal, atraumatic, no cyanosis or edema and Minimal, small caliber varicosities; no signs of venous stasis dermatitis. Pulses: 2+ and symmetric Neurologic: Alert & orientation: X4, thought content appropriate, affect: normal and Pleasant   Adult ECG Report - not checked  Recent Labs:  Labs from hospital stay reviewed. No gross abnormalities.   Other studies Reviewed: Additional studies/ records that were reviewed today include: Hospital echocardiogram, see above   ASSESSMENT / PLAN: Problem List Items Addressed This Visit    CAD S/P percutaneous coronary angioplasty: BMS to RCA in 2004; DES to LAD with PTCA of D2 in 2007;  patent in 2010 (Chronic)    It does not appear that she is active anginal type symptoms. Her echo on follow-up is relatively normal. She is not on beta blocker due to bradycardia and non-statin due to intolerance. She is on low-dose ARB, which is appropriate. Remains on Plavix without aspirin. As per previous discussions, the plan is for conservative management and no invasive or noninvasive ischemic evaluations.      Chronic diastolic heart failure    No heart failure symptoms at present. Reduce Lasix dosing intervals noted above..      Dizziness - Primary (Chronic)    She actually looks a bit of dry  today on exam, I don't know that she needs to be on standing dose of Lasix. We will change it to 2 days a week with when necessary doses. I  also recommended switching her Imdur in evening dose to avoid daytime hypotension.      Essential hypertension (Chronic)    Still worried about her dizziness. The plan for surgery reduce her losartan dose which were now down the 25 mg day. Switching the Imdur to nighttime. She is no longer on the Maxide which is somewhat really reassuring to avoid I O'Malley's. She is just going to be on every other day to twice a week Lasix for the diuretic effect. Avoid dehydration.      Hyperlipidemia with target LDL less than 70 (Chronic)    Labs monitored by PCP. Not on statin due to intolerance. At this point conservative therapy is warranted.      Permanent atrial fibrillation (Chronic)    Essentially chronic/persistent A. fib with auto rate control. Not on any rate control agent for rhythm control agent. No signs of significant bradycardia or tachycardia however. No anticoagulation with warfarin or NOAC, remains on Plavix for stroke prevention as well as CAD. She is too much of  a fall risk for full anticoagulation.         Current medicines are reviewed at length with the patient today. (+/- concerns) along with she needs take the Lasix, The following changes have been made:  Change when you take FUROSEMIDE ( LASIX ) - START TAKING MEDICATIONS EVERY Tuesday AND FRIDAYS ( TWICE A WEEK) YOU CAN USE AS NEEDED FOR SHORTNESS OF BREATH AND SWELLING.  Isosorbide mn take in the evening time -(heart)   labs/ tests ordered today include:   No orders of the defined types were placed in this encounter.   Meds ordered this encounter  Medications  . amoxicillin (AMOXIL) 500 MG capsule    Sig:   . Probiotic Product (ALIGN PO)    Sig: Take by mouth daily.    Followup: October 2016    Marykay Lex, M.D., M.S. Interventional Cardiologist   Pager #  [phone]

## 2015-01-19 ENCOUNTER — Encounter: Payer: Self-pay | Admitting: Cardiology

## 2015-01-19 NOTE — Assessment & Plan Note (Signed)
She actually looks a bit of dry today on exam, I don't know that she needs to be on standing dose of Lasix. We will change it to 2 days a week with when necessary doses. I also recommended switching her Imdur in evening dose to avoid daytime hypotension.

## 2015-01-19 NOTE — Assessment & Plan Note (Signed)
Still worried about her dizziness. The plan for surgery reduce her losartan dose which were now down the 25 mg day. Switching the Imdur to nighttime. She is no longer on the Maxide which is somewhat really reassuring to avoid I O'Malley's. She is just going to be on every other day to twice a week Lasix for the diuretic effect. Avoid dehydration.

## 2015-01-19 NOTE — Assessment & Plan Note (Signed)
Labs monitored by PCP. Not on statin due to intolerance. At this point conservative therapy is warranted.

## 2015-01-19 NOTE — Assessment & Plan Note (Signed)
It does not appear that she is active anginal type symptoms. Her echo on follow-up is relatively normal. She is not on beta blocker due to bradycardia and non-statin due to intolerance. She is on low-dose ARB, which is appropriate. Remains on Plavix without aspirin. As per previous discussions, the plan is for conservative management and no invasive or noninvasive ischemic evaluations.

## 2015-01-19 NOTE — Assessment & Plan Note (Signed)
No heart failure symptoms at present. Reduce Lasix dosing intervals noted above..Marland Kitchen

## 2015-01-19 NOTE — Assessment & Plan Note (Signed)
Essentially chronic/persistent A. fib with auto rate control. Not on any rate control agent for rhythm control agent. No signs of significant bradycardia or tachycardia however. No anticoagulation with warfarin or NOAC, remains on Plavix for stroke prevention as well as CAD. She is too much of  a fall risk for full anticoagulation.

## 2015-01-21 ENCOUNTER — Encounter: Payer: Self-pay | Admitting: Gastroenterology

## 2015-01-27 ENCOUNTER — Other Ambulatory Visit: Payer: Self-pay | Admitting: Family Medicine

## 2015-01-27 ENCOUNTER — Ambulatory Visit
Admission: RE | Admit: 2015-01-27 | Discharge: 2015-01-27 | Disposition: A | Discharge status: Home / Self Care | Payer: Self-pay | Source: Ambulatory Visit | Attending: Family Medicine | Admitting: Family Medicine

## 2015-01-27 DIAGNOSIS — J209 Acute bronchitis, unspecified: Secondary | ICD-10-CM

## 2015-02-15 ENCOUNTER — Ambulatory Visit (INDEPENDENT_AMBULATORY_CARE_PROVIDER_SITE_OTHER): Payer: Commercial Managed Care - HMO | Admitting: Physician Assistant

## 2015-02-15 ENCOUNTER — Encounter: Payer: Self-pay | Admitting: Physician Assistant

## 2015-02-15 VITALS — BP 124/76 | HR 80 | Wt 133.0 lb

## 2015-02-15 DIAGNOSIS — R1310 Dysphagia, unspecified: Secondary | ICD-10-CM | POA: Diagnosis not present

## 2015-02-15 DIAGNOSIS — K219 Gastro-esophageal reflux disease without esophagitis: Secondary | ICD-10-CM

## 2015-02-15 DIAGNOSIS — R0989 Other specified symptoms and signs involving the circulatory and respiratory systems: Secondary | ICD-10-CM

## 2015-02-15 DIAGNOSIS — R093 Abnormal sputum: Secondary | ICD-10-CM

## 2015-02-15 MED ORDER — PANTOPRAZOLE SODIUM 40 MG PO TBEC: 40.0000 mg | DELAYED_RELEASE_TABLET | Freq: Two times a day (BID) | ORAL | Status: AC

## 2015-02-15 NOTE — Progress Notes (Signed)
i agree with the above note, plan 

## 2015-02-15 NOTE — Progress Notes (Signed)
Patient ID: [name], female   DOB: 06-23-1918, 81 y.o.   MRN: [id number]   Subjective:    Patient ID: [name], female    DOB: 25-Dec-1917, 81 y.o.   MRN: [id number]  HPI [name] is a very nice 80 year old white female patient of Dr. [name] and formerly known to Dr. Sheryn Bison. She has history of GERD and remote history of esophageal stricture and last EGD done in 2012 revealed a large prepyloric ulcer and an irregular pylorus. She also has history of congestive heart failure chronic kidney disease, coronary artery disease status post stents in 2004 and 2007 for which she is maintained on Plavix. She also has peripheral vascular disease, atrial fibrillation and hypertension. She comes in today with complaints of a fullness in her throat and the need to continuously clear her throat. She feels like she has a gargle he went quality to her voice. She does feel like she has phlegm is sitting in her throat that she can't get up. She has just completed treatment for a pneumonia and has been sick over the past month but apparently her current esophageal symptoms have been going on for about a year. She has no real complaints of solid food dysphagia or liquid dysphagia, denies heartburn and indigestion. No complaints of abdominal pain nausea or vomiting. Appetite is been okay and her weight has been stable. She has remained on pantoprazole chronically since last diagnosed with ulcers in 2012.  Review of Systems Pertinent positive and negative review of systems were noted in the above HPI section.  All other review of systems was otherwise negative.  Outpatient Encounter Prescriptions as of 02/15/2015  Medication Sig  . acetaminophen (TYLENOL) 500 MG tablet Take 1,000 mg by mouth every 6 (six) hours as needed for mild pain.  Marland Kitchen ALPRAZolam (XANAX) 0.25 MG tablet Take 0.25 mg by mouth at bedtime as needed for anxiety.  Marland Kitchen aluminum-magnesium hydroxide-simethicone (MAALOX) 200-200-20  MG/5ML SUSP Take 15 mLs by mouth every 6 (six) hours as needed (indigestion, heartburn).  . calcium gluconate 500 MG tablet Take 500 mg by mouth 2 (two) times daily.   . Cholecalciferol (VITAMIN D-3 PO) Take 1,000 mg by mouth daily.   . clopidogrel (PLAVIX) 75 MG tablet Take 75 mg by mouth daily.  Marland Kitchen docusate sodium (COLACE) 100 MG capsule Take 100 mg by mouth 2 (two) times daily.    . folic acid (FOLVITE) 1 MG tablet Take 1 mg by mouth daily.    . furosemide (LASIX) 20 MG tablet Take 1 tablet (20 mg total) by mouth daily.  . isosorbide mononitrate (IMDUR) 30 MG 24 hr tablet Take 0.5 tablets (15 mg total) by mouth daily.  Marland Kitchen losartan (COZAAR) 50 MG tablet Take 0.5 tablets (25 mg total) by mouth daily.  . mirtazapine (REMERON) 15 MG tablet Take 15 mg by mouth at bedtime.  . Multiple Vitamin (MULITIVITAMIN WITH MINERALS) TABS Take 1 tablet by mouth daily.  . pantoprazole (PROTONIX) 40 MG tablet Take 1 tablet (40 mg total) by mouth 2 (two) times daily.  . temazepam (RESTORIL) 30 MG capsule Take 30 mg by mouth at bedtime.  . [DISCONTINUED] pantoprazole (PROTONIX) 40 MG tablet Take 40 mg by mouth daily.   . [DISCONTINUED] amoxicillin (AMOXIL) 500 MG capsule   . [DISCONTINUED] fish oil-omega-3 fatty acids 1000 MG capsule Take 1 g by mouth 2 (two) times daily.   . [DISCONTINUED] Probiotic Product (ALIGN PO) Take by mouth daily.   No facility-administered encounter  medications on file as of 02/15/2015.   No Known Allergies Patient Active Problem List   Diagnosis Date Noted  . Chronic diastolic heart failure 01/07/2015  . Chronic kidney disease stage III 01/07/2015  . Hypercalcemia 01/07/2015  . Chest pain 01/06/2015  . Pain in the chest   . Dizziness 11/17/2014  . Permanent atrial fibrillation 11/19/2013  . Exertional dyspnea 11/19/2013  . Closed left hip fracture 11/25/2011  . Fall 11/25/2011  . Gastric outlet obstruction 05/04/2011  . NSAID-induced gastric ulcer 05/04/2011  . Nausea with  vomiting 04/25/2011  . Loss of weight 04/25/2011  . Gastric ulcer, acute 04/25/2011  . History of vagotomy 04/25/2011  . Nausea & vomiting 04/24/2011  . GERD (gastroesophageal reflux disease) 04/24/2011  . Weight loss 04/24/2011  . Hyperlipidemia with target LDL less than 70 10/30/2007  . RENAL ARTERY STENOSIS 10/30/2007  . Peripheral vascular disease 10/30/2007  . PEPTIC ULCER DISEASE 10/30/2007  . GASTROPARESIS 10/30/2007  . HIATAL HERNIA, HX OF 10/30/2007  . Essential hypertension 04/21/2007  . GERD 04/21/2007  . OSTEOARTHRITIS 04/21/2007  . GASTRITIS 03/03/2007  . GASTRIC OUTLET OBSTRUCTION 01/10/2004  . CAD S/P percutaneous coronary angioplasty: BMS to RCA in 2004; DES to LAD with PTCA of D2 in 2007;  patent in 2010 06/20/2002    Class: Diagnosis of  . ESOPHAGEAL STRICTURE 11/10/2001  . DIVERTICULOSIS, COLON 11/10/2001   History   Social History  . Marital Status: Widowed    Spouse Name: N/A  . Number of Children: 3  . Years of Education: N/A   Occupational History  . retired    Social History Main Topics  . Smoking status: Never Smoker   . Smokeless tobacco: Never Used  . Alcohol Use: No  . Drug Use: No  . Sexual Activity: No   Other Topics Concern  . Not on file   Social History Narrative   She was a former long term patient of Dr. Susa Griffins. He also followed her husband, [name], who died several years ago at age 43, he was followed for a history of aortic valve replacement.   She is therefore a widowed mother of 2, grandmother of at least 3.   She still lives alone, but her daughter lives next door. She gets around quite well using a walker until her recent problems with hip pain.   She is a nonsmoker, never drank alcohol.    Ms. Peth family history includes Diabetes in her son; Heart disease in her brother and sister; Hypertension in her father and mother; Stroke in her father and mother; Uterine cancer in her sister. There is no history of  Esophageal cancer, Colon cancer, or Stomach cancer.      Objective:    Filed Vitals:   02/15/15 0959  BP: 124/76  Pulse: 80    Physical Exam well-developed elderly white female sitting in a wheelchair, accompanied by her daughter of blood pressure 124/76 pulse 80 weight 133. HEENT ;nontraumatic normocephalic EOMI PERRLA sclera anicteric, Neck ;supple no JVD, Cardiovascular ;regular rate and rhythm with S1-S2 no murmur or gallop, Pulmonary ;clear bilaterally, she is kyphotic Abdomen ;soft nontender nondistended bowel sounds are active, no palpable mass or hepatosplenomegaly, Rectal; exam not done, Extremities ;no clubbing cyanosis or edema skin warm and dry, Neuropsych; mood and affect appropriate       Assessment & Plan:   #1 81 yo female with one year hx of fullness in throat, phlegm ,repeated clearing of throat This may be all reflux related ,  also consider post nasal drainage  #2 hx of remote esophageal stricture #3 Hx of gastric and pre-pyloric ulcers #4 CAD #5 atrial fib #6RAS #7 chronic anti-platelet therapy #8 diverticulosis #9 CKD  Plan; Increase protonic to 40 mg po BID Add trial of mucinex bid(OTC) Schedule for Barium swallow.  further plans pending response to above  Will be established with Dr Christella Hartigan at family request   Amy Oswald Hillock PA-C 02/15/2015   Cc: [name], MD

## 2015-02-15 NOTE — Patient Instructions (Addendum)
We sent a prescription for Protonix 40 mg twice daily dosing, to [facility] ( mail order).  Start Mucinex over the counter , take 1 tab twice daily for one month.  You have been scheduled for a Barium Esophogram at [facility] (1st floor of the hospital) on 03-07-2015 at 9:30 am . Please arrive 9:15 minutes prior to your appointment for registration. Make certain not to have anything to eat or drink 3 hours prior to your test. If you need to reschedule for any reason, please contact radiology at [phone] to do so. __________________________________________________________________ A barium swallow is an examination that concentrates on views of the esophagus. This tends to be a double contrast exam (barium and two liquids which, when combined, create a gas to distend the wall of the oesophagus) or single contrast (non-ionic iodine based). The study is usually tailored to your symptoms so a good history is essential. Attention is paid during the study to the form, structure and configuration of the esophagus, looking for functional disorders (such as aspiration, dysphagia, achalasia, motility and reflux) EXAMINATION You may be asked to change into a gown, depending on the type of swallow being performed. A radiologist and radiographer will perform the procedure. The radiologist will advise you of the type of contrast selected for your procedure and direct you during the exam. You will be asked to stand, sit or lie in several different positions and to hold a small amount of fluid in your mouth before being asked to swallow while the imaging is performed .In some instances you may be asked to swallow barium coated marshmallows to assess the motility of a solid food bolus. The exam can be recorded as a digital or video fluoroscopy procedure. POST PROCEDURE It will take 1-2 days for the barium to pass through your system. To facilitate this, it is important, unless otherwise directed, to  increase your fluids for the next 24-48hrs and to resume your normal diet.  This test typically takes about 30 minutes to perform. __________________________________________________________________________________

## 2015-02-28 ENCOUNTER — Ambulatory Visit
Admission: RE | Admit: 2015-02-28 | Discharge: 2015-02-28 | Disposition: A | Discharge status: Home / Self Care | Payer: Commercial Managed Care - HMO | Source: Ambulatory Visit | Attending: Family Medicine | Admitting: Family Medicine

## 2015-02-28 ENCOUNTER — Other Ambulatory Visit: Payer: Self-pay | Admitting: Family Medicine

## 2015-02-28 DIAGNOSIS — J181 Lobar pneumonia, unspecified organism: Principal | ICD-10-CM

## 2015-02-28 DIAGNOSIS — J189 Pneumonia, unspecified organism: Secondary | ICD-10-CM

## 2015-03-07 ENCOUNTER — Ambulatory Visit (HOSPITAL_COMMUNITY): Payer: Commercial Managed Care - HMO

## 2015-04-04 ENCOUNTER — Ambulatory Visit (HOSPITAL_COMMUNITY)
Admission: RE | Admit: 2015-04-04 | Discharge: 2015-04-04 | Disposition: A | Discharge status: Home / Self Care | Payer: Commercial Managed Care - HMO | Source: Ambulatory Visit | Attending: Physician Assistant | Admitting: Physician Assistant

## 2015-04-04 DIAGNOSIS — R1310 Dysphagia, unspecified: Secondary | ICD-10-CM | POA: Insufficient documentation

## 2015-04-04 DIAGNOSIS — R093 Abnormal sputum: Secondary | ICD-10-CM | POA: Diagnosis not present

## 2015-04-04 DIAGNOSIS — K222 Esophageal obstruction: Secondary | ICD-10-CM | POA: Diagnosis not present

## 2015-04-04 DIAGNOSIS — R0989 Other specified symptoms and signs involving the circulatory and respiratory systems: Secondary | ICD-10-CM

## 2015-04-08 ENCOUNTER — Telehealth: Payer: Self-pay | Admitting: Cardiology

## 2015-04-11 NOTE — Telephone Encounter (Signed)
Close encounter 

## 2015-05-19 ENCOUNTER — Ambulatory Visit (INDEPENDENT_AMBULATORY_CARE_PROVIDER_SITE_OTHER): Payer: Commercial Managed Care - HMO | Admitting: Cardiology

## 2015-05-19 VITALS — BP 106/72 | HR 80 | Ht 63.0 in | Wt 129.5 lb

## 2015-05-19 DIAGNOSIS — E785 Hyperlipidemia, unspecified: Secondary | ICD-10-CM

## 2015-05-19 DIAGNOSIS — I5032 Chronic diastolic (congestive) heart failure: Secondary | ICD-10-CM | POA: Diagnosis not present

## 2015-05-19 DIAGNOSIS — I482 Chronic atrial fibrillation: Secondary | ICD-10-CM | POA: Diagnosis not present

## 2015-05-19 DIAGNOSIS — R21 Rash and other nonspecific skin eruption: Secondary | ICD-10-CM

## 2015-05-19 DIAGNOSIS — I10 Essential (primary) hypertension: Secondary | ICD-10-CM | POA: Diagnosis not present

## 2015-05-19 DIAGNOSIS — I2510 Atherosclerotic heart disease of native coronary artery without angina pectoris: Secondary | ICD-10-CM | POA: Diagnosis not present

## 2015-05-19 DIAGNOSIS — R42 Dizziness and giddiness: Secondary | ICD-10-CM

## 2015-05-19 DIAGNOSIS — I4821 Permanent atrial fibrillation: Secondary | ICD-10-CM

## 2015-05-19 DIAGNOSIS — R0609 Other forms of dyspnea: Secondary | ICD-10-CM

## 2015-05-19 DIAGNOSIS — Z9861 Coronary angioplasty status: Secondary | ICD-10-CM

## 2015-05-19 NOTE — Patient Instructions (Signed)
STOP TAKING LASIX (FUROSEMIDE) FOR 2 WEEKS FOR POSSIBLE ITCHING.  FOR THE ITCHING' MAY USE HYDROCORTISONE CREAM OVER THE COUNTER - PLACE ON THE AFFECTED AREA DURING THE DAY AT BEDTIME TAKE Zyrtec (or generic) 10 mg tablet- purchase over the counter   Call office in 2  Weeks- let us know if itching has gotten better.   Your physician wants you to follow-up in FEB-MARCH 2016 WITH DR HARDING- 30 MIN.  You will receive a reminder letter in the mail two months in advance. If you don't receive a letter, please call our office to schedule the follow-up appointment.  If you need a refill on your cardiac medications before your next appointment, please call your pharmacy.

## 2015-05-19 NOTE — Progress Notes (Signed)
PCP: [name], WILLIAM, MD  Clinic Note: Chief Complaint  Patient presents with  . Follow-up    4 mo//pt c/o SOB on exertion, mild dizziness, and some swelling in bilateral ankles  . Pruritis    severe itching on head, shoulders, and back//worst is at the back of her head at the neck line/noticed after she started takinfg LASIX and IMDUR, pt states has been happening for a couple of weeks now and it keeps her awake at night    HPI: [name] is a 81 y.o. female with a PMH below who presents today for 4 month f/u.  Diane MaesLillie K Barman was last seen on June 2016 following a hospital stay for CP.  Went to PCP - Dx with PNA.  Recent Hospitalizations:None  Studies Reviewed: none  Interval History: Since I last saw her, she notes feeling less dizzy & less SOB.  However, she still notes that it does not take too much for her to get SOB or dizzy.  Can make up a bed, do dishes without getting too SOB or Dizzy - but anything more than this will be more notable.   Otherwise the main complaint has been itching and rash on the back of her head shoulders and back this started shortly after we initiated treatment with Lasix and Imdur. She actually stopped taking Lasix now.  Otherwise her cardiac standpoint, she denies any chest pain /pressure with rest or exertion. No resting dyspnea. No PND, orthopnea with mild posterior edema. Not currently edematous. No rapid heartbeat/palpitations. No syncope/near syncope, or TIA/amaurosis fugax symptoms. No claudication.  ROS: A comprehensive was performed. Review of Systems  Constitutional: Positive for weight loss (due to reduced appetite).  HENT: Negative for nosebleeds.   Respiratory: Positive for shortness of breath (only with exertion).   Cardiovascular:       Per HPI  Gastrointestinal: Negative for blood in stool and melena.  Genitourinary: Negative for hematuria.  Musculoskeletal: Positive for joint pain ("as expected"). Negative for falls.    Skin: Positive for itching and rash.       See CC & HPI  Neurological: Positive for dizziness and weakness (generalized). Negative for headaches.  Endo/Heme/Allergies: Does not bruise/bleed easily.  Psychiatric/Behavioral: Negative for depression. The patient does not have insomnia.     Past Medical History  Diagnosis Date  . CAD S/P percutaneous coronary angioplasty 2004    ZETA BMS 4.0 mm X 15 mm in RCA - 2004; CYPHER DES 2.165mm x 18 mm to LAD (with PTCA of jailed D2 with ostial 85%)& 2 in 2007  . TIA (transient ischemic attack)   . Renal artery stenosis, native (HCC) 2003-2004    S/P R Renal A Stent 5 mm x 12 mm  . Hypertension     H/o Renovascular HTN, s/p R RA Stent in 06/2003  . Hyperlipidemia   . Diverticulosis   . GERD (gastroesophageal reflux disease)     TIA  . PUD (peptic ulcer disease)     Gastric Ulcer  . Gastric outlet obstruction 2012    remote -- s/p Vagotomy & Pyeloroplasty  . PAF (paroxysmal atrial fibrillation) [facility](HCC) April 2015    Newly diagnosed by PCP  . Multiple allergies   . Anxiety   . Depression   . History of blood transfusion     With Hip Surgery  . Cataract     removed bil eyes  . Osteoporosis     s/p L hip Fxr - Hemiarthroplasty  . Arthritis  R Hip total Arthroplasty & L Hip Hemiarthroplasty    Past Surgical History  Procedure Laterality Date  . Vagotomy and pyloroplasty  01/13/04  . Replacement total hip w/  resurfacing implants Right 04/2002  . Renal artery stent  07/22/2003    Cordis 5x34mm stent to right renal artery (Dr. Jonette Eva)  . Tonsillectomy    . Cataract extraction Bilateral   . Colonoscopy    . Joint replacement    . Hip arthroplasty  11/26/2011    Procedure: ARTHROPLASTY BIPOLAR HIP;  Surgeon: Javier Docker, MD;  Location: [facility] OR;  Service: Orthopedics;  Laterality: Left;  . Nm myocar perf wall motion  08/2007    persantine myoview - normal pattern of perfusion, EF 80%, low risk scan  . Percutaneous coronary stent  intervention (pci-s)  06/24/2003    ACS Zeta BMS 4.0x10mm to RCA (Dr. Jonette Eva)  . Percutaneous coronary stent intervention (pci-s)  11/13/2005    Cypher DES 2.5x64mm to LAD; Cutting PTCA of Ostial D2 (jailed) (Dr. Jonette Eva)  . Transthoracic echocardiogram  11/23/2013; 01/07/2015:    a) EF 55-60%, no regional WMA; MAC without MR;; b) normal LV size and thickness. EF 50-55%. No RWMA, mild LA dilation with mod PR  . Cardiac catheterization  01/17/2009    normal L main, normal LAD, normal L Cfx, RCA with patent stent (Dr. Erlene Quan)  . Coronary angioplasty with stent placement  2007    hx/notes 01/06/2015   Prior to Admission medications   Medication Sig Start Date End Date Taking? Authorizing Provider  acetaminophen (TYLENOL) 500 MG tablet Take 1,000 mg by mouth every 6 (six) hours as needed for mild pain.   Yes Historical Provider, MD  ALPRAZolam Prudy Feeler) 0.25 MG tablet Take 0.25 mg by mouth at bedtime as needed for anxiety.   Yes Historical Provider, MD  calcium gluconate 500 MG tablet Take 500 mg by mouth 2 (two) times daily.    Yes Historical Provider, MD  Cholecalciferol (VITAMIN D-3 PO) Take 1,000 mg by mouth daily.    Yes Historical Provider, MD  clopidogrel (PLAVIX) 75 MG tablet Take 75 mg by mouth daily.   Yes Historical Provider, MD  docusate sodium (COLACE) 100 MG capsule Take 100 mg by mouth 2 (two) times daily.     Yes Historical Provider, MD  folic acid (FOLVITE) 1 MG tablet Take 1 mg by mouth daily.     Yes Historical Provider, MD  furosemide (LASIX) 20 MG tablet Take 1 tablet (20 mg total) by mouth daily. 01/08/15  Yes Nishant Dhungel, MD  isosorbide mononitrate (IMDUR) 30 MG 24 hr tablet Take 0.5 tablets (15 mg total) by mouth daily. 01/08/15  Yes Nishant Dhungel, MD  losartan (COZAAR) 50 MG tablet Take 0.5 tablets (25 mg total) by mouth daily. 01/08/15  Yes Nishant Dhungel, MD  mirtazapine (REMERON) 15 MG tablet Take 15 mg by mouth at bedtime.   Yes Historical Provider, MD    Multiple Vitamin (MULITIVITAMIN WITH MINERALS) TABS Take 1 tablet by mouth daily.   Yes Historical Provider, MD  pantoprazole (PROTONIX) 40 MG tablet Take 1 tablet (40 mg total) by mouth 2 (two) times daily. 02/15/15  Yes Amy S Esterwood, PA-C  temazepam (RESTORIL) 30 MG capsule Take 30 mg by mouth at bedtime.   Yes Historical Provider, MD   No Known Allergies   Social History   Social History  . Marital Status: Widowed    Spouse Name: N/A  . Number of Children:  3  . Years of Education: N/A   Occupational History  . retired    Social History Main Topics  . Smoking status: Never Smoker   . Smokeless tobacco: Never Used  . Alcohol Use: No  . Drug Use: No  . Sexual Activity: No   Other Topics Concern  . None   Social History Narrative   She was a former long term patient of Dr. Susa Griffins. He also followed her husband, Rayna Sexton, who died several years ago at age 31, he was followed for a history of aortic valve replacement.   She is therefore a widowed mother of 2, grandmother of at least 3.   She still lives alone, but her daughter lives next door. She gets around quite well using a walker until her recent problems with hip pain.   She is a nonsmoker, never drank alcohol.   Family History  Problem Relation Age of Onset  . Uterine cancer Sister   . Heart disease Sister   . Diabetes Son   . Esophageal cancer Neg Hx   . Colon cancer Neg Hx   . Stomach cancer Neg Hx   . Hypertension Mother   . Stroke Mother   . Hypertension Father   . Stroke Father   . Heart disease Brother     Wt Readings from Last 3 Encounters:  05/19/15 129 lb 8 oz (58.741 kg)  02/15/15 133 lb (60.328 kg)  01/13/15 133 lb (60.328 kg)  -- decreased appetite.   PHYSICAL EXAM BP 106/72 mmHg  Pulse 80  Ht  (1.6 m)  Wt 129 lb 8 oz (58.741 kg)  BMI 22.95 kg/m2 General appearance: alert, cooperative, appears stated age, no distress. She has significant thoracic kyphoscoliosis; she is  mildly frail, but vibrant mentally. HEENT: [place]/AT, EOMI, MMM, anicteric sclera - with post-cataract surgical findings Neck: no adenopathy, no carotid bruit, no JVD, supple, symmetrical, trachea midline and thyroid not enlarged, symmetric, no tenderness/mass/nodules Lungs: clear to auscultation bilaterally, normal percussion bilaterally and Nonlabored, good air movement Heart: Irreg-Irreg, normal S1 & S2.  Nondisplaced PMI. 1-2/6 early peaking C-D SEM at RUSB --> carotids. No other R/G gallops Abdomen: soft, non-tender; bowel sounds normal; no masses, no organomegaly Extremities: extremities normal, atraumatic, no cyanosis or edema and Minimal, small caliber varicosities; no signs of venous stasis dermatitis. Pulses: 2+ and symmetric Skin: diffuse maculopapular / pruritic rash on posterior scalp & nuchal region - less prominent on the back. Neurologic: Alert & orientation: X4, thought content appropriate, affect: normal and Pleasant    Adult ECG Report Not checked  Other studies Reviewed: Additional studies/ records that were reviewed today include:  Recent Labs:  None available    ASSESSMENT / PLAN: Problem List Items Addressed This Visit    Rash of back    Not sure what is the cause - ? If medication related - OK to hold lasix for ~2 weeks - Rec: Hydrocortisone cream & qhs Zyrtec  --> Defer to PCP for further managment       Permanent atrial fibrillation (HCC) (Chronic)    Not requiring a rate control agent -- no BB b/c of significant bradycardia.   Thankfully - no Tachycardia. No A/C due to fall & bleeding concerns -- on ASA/Plavix. --> pt & family agree with no excalation of CVA prophylaxis.      Hyperlipidemia with target LDL less than 70 (Chronic)    Monitored by PCP. Conservative management --  Not on statin b/c side effects /  intolerance      Exertional dyspnea (Chronic)    Probably multifactorial -- certainly Afib & DHF play a roll. Able to do most ADLs.   No  interest in ischemic evaluation. Rx of Diastolic dysfunction is limited by dizziness & borderline hypotension.      Essential hypertension (Chronic)    No longer really of concern - especially with Dizziness that is at lest related to orthostatic Sx -  Only on low dose ARB -- Ok to hold if excessively dizzy.  Especially if not feeling well & not eating/drinking.      Dizziness (Chronic)    Improved - but still present. We may need to fully d/c ARB. Better with Imdur qhs.  See how she does off of Lasix x 2 weeks      Chronic diastolic heart failure (HCC) (Chronic)    No real CHF Sx of PND or orthopnea - edeam pretty well controlled. Should be OK to d/c Lasix (in order to see if pruritic rash improves) -- if no change - would assume that there is no relationship to Lasix. -- then reduce to every other day & PRN.      CAD S/P percutaneous coronary angioplasty: BMS to RCA in 2004; DES to LAD with PTCA of D2 in 2007;  patent in 2010 - Primary (Chronic)    No obvious active Angina Sx.  Recent Echo was relatively normal.  Not on BB (despite C Afib) due to Bradycardia. Not on Statin - due to side effects On Plavix alone. Per Patient & Family request - plan is conservative medical management without invasive or non-invasive evaluations -- therefor no ST performed.         Current medicines are reviewed at length with the patient today. (+/- concerns) Noted Itching with rash on back of scalp & back -- started shortly after lasix & Imdur started.  The following changes have been made:  STOP TAKING LASIX (FUROSEMIDE) FOR 2 WEEKS FOR POSSIBLE ITCHING.  FOR THE ITCHING' MAY USE HYDROCORTISONE CREAM OVER THE COUNTER - PLACE ON THE AFFECTED AREA DURING THE DAY AT BEDTIME TAKE Zyrtec (or generic) 10 mg tablet- purchase over the counter   Call office in 2  Weeks- let us know if itching has gotten better.   Your physician wants you to follow-up in FEB-MARCH 2016 WITH DR Syniyah Bourne- 30  MIN.  Studies Ordered:   No orders of the defined types were placed in this encounter.      Marykay Lex, M.D., M.S. Interventional Cardiologist   Pager # [phone]

## 2015-05-21 ENCOUNTER — Encounter: Payer: Self-pay | Admitting: Cardiology

## 2015-05-21 DIAGNOSIS — R21 Rash and other nonspecific skin eruption: Secondary | ICD-10-CM | POA: Insufficient documentation

## 2015-05-21 NOTE — Assessment & Plan Note (Signed)
Probably multifactorial -- certainly Afib & DHF play a roll. Able to do most ADLs.   No interest in ischemic evaluation. Rx of Diastolic dysfunction is limited by dizziness & borderline hypotension.

## 2015-05-21 NOTE — Assessment & Plan Note (Signed)
Monitored by PCP. Conservative management --  Not on statin b/c side effects / intolerance

## 2015-05-21 NOTE — Assessment & Plan Note (Signed)
Not sure what is the cause - ? If medication related - OK to hold lasix for ~2 weeks - Rec: Hydrocortisone cream & qhs Zyrtec  --> Defer to PCP for further managment

## 2015-05-21 NOTE — Assessment & Plan Note (Signed)
Not requiring a rate control agent -- no BB b/c of significant bradycardia.   Thankfully - no Tachycardia. No A/C due to fall & bleeding concerns -- on ASA/Plavix. --> pt & family agree with no excalation of CVA prophylaxis.

## 2015-05-21 NOTE — Assessment & Plan Note (Signed)
No real CHF Sx of PND or orthopnea - edeam pretty well controlled. Should be OK to d/c Lasix (in order to see if pruritic rash improves) -- if no change - would assume that there is no relationship to Lasix. -- then reduce to every other day & PRN.

## 2015-05-21 NOTE — Assessment & Plan Note (Signed)
No longer really of concern - especially with Dizziness that is at lest related to orthostatic Sx -  Only on low dose ARB -- Ok to hold if excessively dizzy.  Especially if not feeling well & not eating/drinking.

## 2015-05-21 NOTE — Assessment & Plan Note (Signed)
Improved - but still present. We may need to fully d/c ARB. Better with Imdur qhs.  See how she does off of Lasix x 2 weeks

## 2015-05-21 NOTE — Assessment & Plan Note (Signed)
No obvious active Angina Sx.  Recent Echo was relatively normal.  Not on BB (despite C Afib) due to Bradycardia. Not on Statin - due to side effects On Plavix alone. Per Patient & Family request - plan is conservative medical management without invasive or non-invasive evaluations -- therefor no ST performed.

## 2015-06-02 ENCOUNTER — Telehealth: Payer: Self-pay | Admitting: Cardiology

## 2015-06-02 NOTE — Telephone Encounter (Signed)
Please call,she wants to give you an update on the pt.

## 2015-06-02 NOTE — Telephone Encounter (Signed)
Ok to hold imdur for 1-2 weeks to see if itching stops.  Has she had any problems with edema since stopping the furosemide?  Can restart at 20 mg daily, but watch daily weights.

## 2015-06-02 NOTE — Telephone Encounter (Signed)
Received update on patient. Daughter reports that patient is still having frequent scalp itching r/t what they thought was a reaction to her diuretic. She was taken off this med 2 weeks ago.  Daughter asked if this could instead be caused by isosorbide. Also requests advice on whether to resume diuretic.  Informed her I would request advice from pharmacist. Advised for now no changes to meds as taking presently, please continue anti-itch hydrocortisone cream.

## 2015-06-03 NOTE — Telephone Encounter (Signed)
Left detailed message on pt's mothers private vm with instructions from  PharmD Phillips HayKristin Alvstad. I told Ms Everardo Allllison she could call me back at [place] office if she had further questions

## 2015-06-22 ENCOUNTER — Telehealth: Payer: Self-pay | Admitting: Cardiology

## 2015-06-22 NOTE — Telephone Encounter (Signed)
Daughter states patient has stopped isosorbide as instructed at last office appointment Patient thought the medication was causing patient head to itch.   Daughter states the medication was not causing the  Itching. The patient would like to restart isosorbide. Per daughter, the patient is not having any issue at present - no chest discomfort, short of breath,fatigue, diaphoresis.  RN informed daughter - will defer to Dr Herbie BaltimoreHarding and contact her back .she verbalized understanding.

## 2015-06-23 MED ORDER — ISOSORBIDE MONONITRATE ER 30 MG PO TB24: 15.0000 mg | ORAL_TABLET | Freq: Every day | ORAL | Status: DC

## 2015-06-23 NOTE — Telephone Encounter (Signed)
I am fine restarting Imdur -- but probably not needed if not having CP or SOB.  Marykay LexHARDING, Jame Seelig W, MD

## 2015-06-23 NOTE — Telephone Encounter (Signed)
Spoke to Ms Everardo AllLLISON (patient's daughter) informed her that isosorbide mn  15 mg daily. Is okay to restart.  she states patient will feel comfortable taking medication. E-sent to mail order --[facility] . She verbailzed understanding.

## 2015-11-28 ENCOUNTER — Ambulatory Visit (INDEPENDENT_AMBULATORY_CARE_PROVIDER_SITE_OTHER): Payer: Commercial Managed Care - HMO | Admitting: Cardiology

## 2015-11-28 ENCOUNTER — Encounter: Payer: Self-pay | Admitting: Cardiology

## 2015-11-28 VITALS — BP 110/82 | HR 55 | Ht 63.0 in | Wt 133.0 lb

## 2015-11-28 DIAGNOSIS — R42 Dizziness and giddiness: Secondary | ICD-10-CM | POA: Diagnosis not present

## 2015-11-28 DIAGNOSIS — I4821 Permanent atrial fibrillation: Secondary | ICD-10-CM

## 2015-11-28 DIAGNOSIS — I2510 Atherosclerotic heart disease of native coronary artery without angina pectoris: Secondary | ICD-10-CM | POA: Diagnosis not present

## 2015-11-28 DIAGNOSIS — I482 Chronic atrial fibrillation: Secondary | ICD-10-CM | POA: Diagnosis not present

## 2015-11-28 DIAGNOSIS — R0609 Other forms of dyspnea: Secondary | ICD-10-CM

## 2015-11-28 DIAGNOSIS — Z9861 Coronary angioplasty status: Secondary | ICD-10-CM

## 2015-11-28 DIAGNOSIS — I5032 Chronic diastolic (congestive) heart failure: Secondary | ICD-10-CM

## 2015-11-28 DIAGNOSIS — E785 Hyperlipidemia, unspecified: Secondary | ICD-10-CM

## 2015-11-28 DIAGNOSIS — I10 Essential (primary) hypertension: Secondary | ICD-10-CM | POA: Diagnosis not present

## 2015-11-28 NOTE — Progress Notes (Signed)
PCP: [name], MD  Clinic Note: Chief Complaint  Patient presents with  . Referral    From [facility], Dizziness 2-3 weeks, doe,   . Coronary Artery Disease  . Atrial Fibrillation  . Dizziness    HPI: [name] is a 82 y.o. female with a PMH below who presents today for Six-month follow-up for CAD and atrial fibrillation.[name] was last seen on 05/19/2015. At that time she is noticing less dizziness and dyspnea after we made some significant reductions into her blood pressure regimen. Mild baseline exertional dyspnea.  Recent Hospitalizations: None  Studies Reviewed: None  Interval History: [name] presents doing pretty well today. The biggest thing she complains about is dizziness. She has her baseline exertional dyspnea which doesn't bother her much for the amount of exercise and activity but she does. Thankfully, she has not thought had any falls of the dizziness, just simply feels dizzy throughout the course of the day. Oftentimes with sitting down. It doesn't seem to be affected by change in positioning although certainly if she stands up quickly she will get more dizzy. She otherwise denies any cardiac symptoms of anginal type chest pain with rest or exertion. No resting dyspnea, PND, orthopnea or significant edema.  She denies palpitations, lightheadedness, dizziness, weakness or syncope/near syncope. No TIA/amaurosis fugax symptoms. No melena, hematochezia, hematuria, or epstaxis. No claudication.  ROS: A comprehensive was performed. Review of Systems  Constitutional: Negative for fever, chills and malaise/fatigue.  HENT: Negative for congestion and nosebleeds.   Respiratory: Negative for cough, shortness of breath and wheezing.   Gastrointestinal: Negative for blood in stool and melena.  Genitourinary: Negative for hematuria.  Musculoskeletal:       Diffuse joint pains from arthritis.  Neurological: Positive for dizziness (Some vertigo  symptoms as well as some orthostatic symptoms). Negative for weakness and headaches.  Endo/Heme/Allergies: Bruises/bleeds easily.  Psychiatric/Behavioral: Positive for memory loss (She hasn't really seem to have much significant memory loss, just a few fine details.). Negative for depression. The patient has insomnia (Poor sleep. she takes several medicines to help.). The patient is not nervous/anxious.   All other systems reviewed and are negative.    Past Medical History  Diagnosis Date  . CAD S/P percutaneous coronary angioplasty 2004    ZETA BMS 4.0 mm X 15 mm in RCA - 2004; CYPHER DES 2.33mm x 18 mm to LAD (with PTCA of jailed D2 with ostial 85%)& 2 in 2007  . TIA (transient ischemic attack)   . Renal artery stenosis, native (HCC) 2003-2004    S/P R Renal A Stent 5 mm x 12 mm  . Hypertension     H/o Renovascular HTN, s/p R RA Stent in 06/2003  . Hyperlipidemia   . Diverticulosis   . GERD (gastroesophageal reflux disease)     TIA  . PUD (peptic ulcer disease)     Gastric Ulcer  . Gastric outlet obstruction 2012    remote -- s/p Vagotomy & Pyeloroplasty  . PAF (paroxysmal atrial fibrillation) [facility]) April 2015    Newly diagnosed by PCP  . Multiple allergies   . Anxiety   . Depression   . History of blood transfusion     With Hip Surgery  . Cataract     removed bil eyes  . Osteoporosis     s/p L hip Fxr - Hemiarthroplasty  . Arthritis     R Hip total Arthroplasty & L Hip Hemiarthroplasty  Past Surgical History  Procedure Laterality Date  . Vagotomy and pyloroplasty  01/13/04  . Replacement total hip w/  resurfacing implants Right 04/2002  . Renal artery stent  07/22/2003    Cordis 5x65mm stent to right renal artery (Dr. Jonette Eva)  . Tonsillectomy    . Cataract extraction Bilateral   . Colonoscopy    . Joint replacement    . Hip arthroplasty  11/26/2011    Procedure: ARTHROPLASTY BIPOLAR HIP;  Surgeon: Javier Docker, MD;  Location: [facility] OR;  Service: Orthopedics;   Laterality: Left;  . Nm myocar perf wall motion  08/2007    persantine myoview - normal pattern of perfusion, EF 80%, low risk scan  . Percutaneous coronary stent intervention (pci-s)  06/24/2003    ACS Zeta BMS 4.0x26mm to RCA (Dr. Jonette Eva)  . Percutaneous coronary stent intervention (pci-s)  11/13/2005    Cypher DES 2.5x30mm to LAD; Cutting PTCA of Ostial D2 (jailed) (Dr. Jonette Eva)  . Transthoracic echocardiogram  11/23/2013; 01/07/2015:    a) EF 55-60%, no regional WMA; MAC without MR;; b) normal LV size and thickness. EF 50-55%. No RWMA, mild LA dilation with mod PR  . Cardiac catheterization  01/17/2009    normal L main, normal LAD, normal L Cfx, RCA with patent stent (Dr. Erlene Quan)  . Coronary angioplasty with stent placement  2007    hx/notes 01/06/2015    Prior to Admission medications   Medication Sig Start Date End Date Taking? Authorizing Provider  acetaminophen (TYLENOL) 500 MG tablet Take 1,000 mg by mouth every 6 (six) hours as needed for mild pain.   Yes Historical Provider, MD  ALPRAZolam Prudy Feeler) 0.5 MG tablet  10/17/15  Yes Historical Provider, MD  calcium gluconate 500 MG tablet Take 500 mg by mouth 2 (two) times daily.    Yes Historical Provider, MD  Cholecalciferol (VITAMIN D-3 PO) Take 1,000 mg by mouth daily.    Yes Historical Provider, MD  clopidogrel (PLAVIX) 75 MG tablet Take 75 mg by mouth daily.   Yes Historical Provider, MD  docusate sodium (COLACE) 100 MG capsule Take 100 mg by mouth 2 (two) times daily.     Yes Historical Provider, MD  folic acid (FOLVITE) 1 MG tablet Take 1 mg by mouth daily.     Yes Historical Provider, MD  HYDROcodone-acetaminophen (NORCO/VICODIN) 5-325 MG tablet TAKE 1 TABLET BY MOUTH EVERY 6 HOURS AS NEEDED **MUST LAST 30 DAYS PER MD** 10/11/15  Yes Historical Provider, MD  isosorbide mononitrate (IMDUR) 30 MG 24 hr tablet Take 0.5 tablets (15 mg total) by mouth daily. 06/23/15  Yes Marykay Lex, MD  losartan (COZAAR) 50 MG tablet Take  0.5 tablets (25 mg total) by mouth daily. 01/08/15  Yes Nishant Dhungel, MD  mirtazapine (REMERON) 30 MG tablet  11/25/15  Yes Historical Provider, MD  Multiple Vitamin (MULITIVITAMIN WITH MINERALS) TABS Take 1 tablet by mouth daily.   Yes Historical Provider, MD  pantoprazole (PROTONIX) 40 MG tablet Take 1 tablet (40 mg total) by mouth 2 (two) times daily. 02/15/15  Yes Amy S Esterwood, PA-C  temazepam (RESTORIL) 30 MG capsule Take 30 mg by mouth at bedtime.   Yes Historical Provider, MD   No Known Allergies   Social History   Social History  . Marital Status: Widowed    Spouse Name: N/A  . Number of Children: 3  . Years of Education: N/A   Occupational History  . retired    Social History  Main Topics  . Smoking status: Never Smoker   . Smokeless tobacco: Never Used  . Alcohol Use: No  . Drug Use: No  . Sexual Activity: No   Other Topics Concern  . None   Social History Narrative   She was a former long term patient of Dr. Susa Griffinsichard Weintraub. He also followed her husband, Rayna SextonRalph, who died several years ago at age 80, he was followed for a history of aortic valve replacement.   She is therefore a widowed mother of 2, grandmother of at least 3.   She still lives alone, but her daughter lives next door. She gets around quite well using a walker until her recent problems with hip pain.   She is a nonsmoker, never drank alcohol.    family history includes Diabetes in her son; Heart disease in her brother and sister; Hypertension in her father and mother; Stroke in her father and mother; Uterine cancer in her sister. There is no history of Esophageal cancer, Colon cancer, or Stomach cancer.   Wt Readings from Last 3 Encounters:  11/28/15 133 lb (60.328 kg)  05/19/15 129 lb 8 oz (58.741 kg)  02/15/15 133 lb (60.328 kg)    PHYSICAL EXAM BP 110/82 mmHg  Pulse 55  Ht 5\' 3"  (1.6 m)  Wt 133 lb (60.328 kg)  BMI 23.57 kg/m2 General appearance: alert, cooperative, appears stated age,  no distress and Well groomed. She appeared to be relatively frail but well-nourished. Stable weight. HEENT: [place]/AT, EOMI, MMM, anicteric sclera - post-cataract surgical findings. Neck: no adenopathy, no carotid bruit and no JVD Lungs: clear to auscultation bilaterally, normal percussion bilaterally and non-labored, good air movement Heart: Irreg-Irreg, normal S1 & S2. Nondisplaced PMI. 1-2/6 early peaking C-D SEM at RUSB --> carotids. No other R/G gallops Abdomen: soft, non-tender; bowel sounds normal; no masses,  no organomegaly; no HJR Extremities: extremities normal, atraumatic, no cyanosis, trace edema. Pulses: 2+ and symmetric;  Skin: Somewhat thin and frail skin, but no significant lesions besides mild bruising. Neurologic: Mental status: Alert, oriented, thought content appropriate    Adult ECG Report  Rate: 55 ;  Rhythm: atrial fibrillation and Slow ventricular response of 55 bpm. Otherwise normal axis, intervals and durations.;   Narrative Interpretation: Stable EKG   Other studies Reviewed: Additional studies/ records that were reviewed today include:  Recent Labs:  Lab Results  Component Value Date   CREATININE 1.19* 01/06/2015     ASSESSMENT / PLAN: Problem List Items Addressed This Visit    Permanent atrial fibrillation (HCC) (Chronic)    Pretty much rate control without medications. No signs of significant bradycardia. Also thankfully no tachycardia. Only on Plavix for stroke prophylaxis. Not on aspirin, not on full anticoagulation with DOAC or warfarin, per patient and family request      Relevant Orders   EKG 12-Lead   Hyperlipidemia with target LDL less than 70 (Chronic)    Really conservative management here. She is not tolerating a statin. Would simply just monitor to make sure is not getting out of control. PCP is following labs.      Exertional dyspnea (Chronic)    Again likely multifactorial. I think A. fib with low but diastolic dysfunction does play a  role, but nothing concerning. She is able do her ADLs. No plans for ischemic evaluation. Unable to really treat diastolic dysfunction as we are having to back off on blood pressure medications as well as nitrates for dizziness.      Essential hypertension -  Primary (Chronic)    Not really an issue. She still has dizziness. To just take away the concern of possible orthostatic dizziness associated with her symptoms, we will simply stop the ARB along with Imdur.      Relevant Orders   EKG 12-Lead   Dizziness (Chronic)    Still not really sure what this is from. I think just to simplify issues I will stop Imdur and ARB altogether. This will take away the concern for medication induced orthostatic dizziness. As she is not really having any heart failure symptoms or angina, no need for the ARB and nitrate.      Chronic diastolic heart failure (HCC) (Chronic)    Has not had any really significant symptoms in the long time. Not requiring diuretic.  Okay to stop ARB. She doesn't have a prescription for when necessary Lasix.      CAD S/P percutaneous coronary angioplasty: BMS to RCA in 2004; DES to LAD with PTCA of D2 in 2007;  patent in 2010 (Chronic)    No recurrent anginal symptoms. Normal echo recently. She is not on a beta blocker despite being in A. fib secondary to history of bradycardia. Not on statin due to side effects. Takes Plavix for combination CAD and stroke prevention with A. fib.  Patient family both agree with plan for conservative management without invasive coronary invasive evaluations.         Current medicines are reviewed at length with the patient today. (+/- concerns) DIZZY The following changes have been made:  STOP TAKING ISOSORBIDE MONO (IMDUR)  STOP TAKING    LOSARTAN  NO OTHER CHANGES WITH CURRENT MEDICATIONS  Your physician wants you to follow-up in 6 MONTHS WITH DR HARDING.  Studies Ordered:   Orders Placed This Encounter  Procedures  . EKG  12-Lead      Marykay Lex, M.D., M.S. Interventional Cardiologist   Pager # [phone] Phone # [phone] [address] [place], [place] [address]

## 2015-11-28 NOTE — Patient Instructions (Signed)
STOP TAKING ISOSORBIDE MONO (IMDUR)  STOP TAKING    LOSARTAN  NO OTHER CHANGES WITH CURRENT MEDICATIONS  Your physician wants you to follow-up in 6 MONTHS WITH DR HARDING.  You will receive a reminder letter in the mail two months in advance. If you don't receive a letter, please call our office to schedule the follow-up appointment.  If you need a refill on your cardiac medications before your next appointment, please call your pharmacy.

## 2015-11-29 ENCOUNTER — Encounter: Payer: Self-pay | Admitting: Cardiology

## 2015-11-29 NOTE — Assessment & Plan Note (Signed)
Really conservative management here. She is not tolerating a statin. Would simply just monitor to make sure is not getting out of control. PCP is following labs.

## 2015-11-29 NOTE — Assessment & Plan Note (Signed)
Again likely multifactorial. I think A. fib with low but diastolic dysfunction does play a role, but nothing concerning. She is able do her ADLs. No plans for ischemic evaluation. Unable to really treat diastolic dysfunction as we are having to back off on blood pressure medications as well as nitrates for dizziness.

## 2015-11-29 NOTE — Assessment & Plan Note (Signed)
No recurrent anginal symptoms. Normal echo recently. She is not on a beta blocker despite being in A. fib secondary to history of bradycardia. Not on statin due to side effects. Takes Plavix for combination CAD and stroke prevention with A. fib.  Patient family both agree with plan for conservative management without invasive coronary invasive evaluations.

## 2015-11-29 NOTE — Assessment & Plan Note (Signed)
Has not had any really significant symptoms in the long time. Not requiring diuretic.  Okay to stop ARB. She doesn't have a prescription for when necessary Lasix.

## 2015-11-29 NOTE — Assessment & Plan Note (Signed)
Not really an issue. She still has dizziness. To just take away the concern of possible orthostatic dizziness associated with her symptoms, we will simply stop the ARB along with Imdur.

## 2015-11-29 NOTE — Assessment & Plan Note (Signed)
Still not really sure what this is from. I think just to simplify issues I will stop Imdur and ARB altogether. This will take away the concern for medication induced orthostatic dizziness. As she is not really having any heart failure symptoms or angina, no need for the ARB and nitrate.

## 2015-11-29 NOTE — Assessment & Plan Note (Addendum)
Pretty much rate control without medications. No signs of significant bradycardia. Also thankfully no tachycardia. Only on Plavix for stroke prophylaxis. Not on aspirin, not on full anticoagulation with DOAC or warfarin, per patient and family request

## 2016-04-22 DIAGNOSIS — I639 Cerebral infarction, unspecified: Secondary | ICD-10-CM

## 2016-04-22 HISTORY — DX: Cerebral infarction, unspecified: I63.9

## 2016-04-26 ENCOUNTER — Inpatient Hospital Stay (HOSPITAL_COMMUNITY)
Admission: EM | Admit: 2016-04-26 | Discharge: 2016-04-30 | DRG: 065 | Disposition: A | Discharge status: Skilled Nursing Facility | Payer: Commercial Managed Care - HMO | Attending: Family Medicine | Admitting: Family Medicine

## 2016-04-26 ENCOUNTER — Emergency Department (HOSPITAL_COMMUNITY): Payer: Commercial Managed Care - HMO

## 2016-04-26 ENCOUNTER — Encounter (HOSPITAL_COMMUNITY): Payer: Self-pay | Admitting: Emergency Medicine

## 2016-04-26 DIAGNOSIS — Z833 Family history of diabetes mellitus: Secondary | ICD-10-CM

## 2016-04-26 DIAGNOSIS — W08XXXA Fall from other furniture, initial encounter: Secondary | ICD-10-CM | POA: Diagnosis present

## 2016-04-26 DIAGNOSIS — R410 Disorientation, unspecified: Secondary | ICD-10-CM | POA: Diagnosis not present

## 2016-04-26 DIAGNOSIS — Z8711 Personal history of peptic ulcer disease: Secondary | ICD-10-CM

## 2016-04-26 DIAGNOSIS — I636 Cerebral infarction due to cerebral venous thrombosis, nonpyogenic: Secondary | ICD-10-CM | POA: Diagnosis not present

## 2016-04-26 DIAGNOSIS — I10 Essential (primary) hypertension: Secondary | ICD-10-CM | POA: Diagnosis present

## 2016-04-26 DIAGNOSIS — Z96643 Presence of artificial hip joint, bilateral: Secondary | ICD-10-CM | POA: Diagnosis present

## 2016-04-26 DIAGNOSIS — N390 Urinary tract infection, site not specified: Secondary | ICD-10-CM | POA: Diagnosis present

## 2016-04-26 DIAGNOSIS — Z7902 Long term (current) use of antithrombotics/antiplatelets: Secondary | ICD-10-CM

## 2016-04-26 DIAGNOSIS — K219 Gastro-esophageal reflux disease without esophagitis: Secondary | ICD-10-CM | POA: Diagnosis present

## 2016-04-26 DIAGNOSIS — Z955 Presence of coronary angioplasty implant and graft: Secondary | ICD-10-CM

## 2016-04-26 DIAGNOSIS — R296 Repeated falls: Secondary | ICD-10-CM | POA: Diagnosis present

## 2016-04-26 DIAGNOSIS — M6281 Muscle weakness (generalized): Secondary | ICD-10-CM

## 2016-04-26 DIAGNOSIS — S0091XA Abrasion of unspecified part of head, initial encounter: Secondary | ICD-10-CM | POA: Diagnosis present

## 2016-04-26 DIAGNOSIS — I4821 Permanent atrial fibrillation: Secondary | ICD-10-CM | POA: Diagnosis present

## 2016-04-26 DIAGNOSIS — Z8249 Family history of ischemic heart disease and other diseases of the circulatory system: Secondary | ICD-10-CM

## 2016-04-26 DIAGNOSIS — Z9861 Coronary angioplasty status: Secondary | ICD-10-CM

## 2016-04-26 DIAGNOSIS — I6339 Cerebral infarction due to thrombosis of other cerebral artery: Secondary | ICD-10-CM

## 2016-04-26 DIAGNOSIS — I5032 Chronic diastolic (congestive) heart failure: Secondary | ICD-10-CM | POA: Diagnosis present

## 2016-04-26 DIAGNOSIS — I6330 Cerebral infarction due to thrombosis of unspecified cerebral artery: Secondary | ICD-10-CM | POA: Diagnosis present

## 2016-04-26 DIAGNOSIS — Z8673 Personal history of transient ischemic attack (TIA), and cerebral infarction without residual deficits: Secondary | ICD-10-CM

## 2016-04-26 DIAGNOSIS — Z823 Family history of stroke: Secondary | ICD-10-CM

## 2016-04-26 DIAGNOSIS — I639 Cerebral infarction, unspecified: Secondary | ICD-10-CM | POA: Diagnosis present

## 2016-04-26 DIAGNOSIS — W19XXXA Unspecified fall, initial encounter: Secondary | ICD-10-CM

## 2016-04-26 DIAGNOSIS — R2689 Other abnormalities of gait and mobility: Secondary | ICD-10-CM

## 2016-04-26 DIAGNOSIS — Y92000 Kitchen of unspecified non-institutional (private) residence as  the place of occurrence of the external cause: Secondary | ICD-10-CM

## 2016-04-26 DIAGNOSIS — E785 Hyperlipidemia, unspecified: Secondary | ICD-10-CM | POA: Diagnosis present

## 2016-04-26 DIAGNOSIS — I2510 Atherosclerotic heart disease of native coronary artery without angina pectoris: Secondary | ICD-10-CM

## 2016-04-26 DIAGNOSIS — I482 Chronic atrial fibrillation: Secondary | ICD-10-CM | POA: Diagnosis present

## 2016-04-26 LAB — CBC WITH DIFFERENTIAL/PLATELET
BASOS ABS: 0 10*3/uL (ref 0.0–0.1)
Basophils Relative: 0 %
Eosinophils Absolute: 0.1 10*3/uL (ref 0.0–0.7)
Eosinophils Relative: 1 %
HCT: 43.4 % (ref 36.0–46.0)
Hemoglobin: 14.1 g/dL (ref 12.0–15.0)
LYMPHS PCT: 17 %
Lymphs Abs: 1.7 10*3/uL (ref 0.7–4.0)
MCH: 31.9 pg (ref 26.0–34.0)
MCHC: 32.5 g/dL (ref 30.0–36.0)
MCV: 98.2 fL (ref 78.0–100.0)
MONO ABS: 0.7 10*3/uL (ref 0.1–1.0)
Monocytes Relative: 7 %
Neutro Abs: 7.4 10*3/uL (ref 1.7–7.7)
Neutrophils Relative %: 75 %
Platelets: 165 10*3/uL (ref 150–400)
RBC: 4.42 MIL/uL (ref 3.87–5.11)
RDW: 14.2 % (ref 11.5–15.5)
WBC: 10 10*3/uL (ref 4.0–10.5)

## 2016-04-26 LAB — COMPREHENSIVE METABOLIC PANEL
ALT: 17 U/L (ref 14–54)
AST: 22 U/L (ref 15–41)
Albumin: 3.7 g/dL (ref 3.5–5.0)
Alkaline Phosphatase: 78 U/L (ref 38–126)
Anion gap: 11 (ref 5–15)
BILIRUBIN TOTAL: 0.3 mg/dL (ref 0.3–1.2)
BUN: 29 mg/dL — AB (ref 6–20)
CALCIUM: 10.3 mg/dL (ref 8.9–10.3)
CO2: 23 mmol/L (ref 22–32)
CREATININE: 1.1 mg/dL — AB (ref 0.44–1.00)
Chloride: 105 mmol/L (ref 101–111)
GFR calc Af Amer: 48 mL/min — ABNORMAL LOW (ref >60)
GFR calc non Af Amer: 41 mL/min — ABNORMAL LOW (ref >60)
Glucose, Bld: 140 mg/dL — ABNORMAL HIGH (ref 65–99)
POTASSIUM: 4 mmol/L (ref 3.5–5.1)
Sodium: 139 mmol/L (ref 135–145)
Total Protein: 7.2 g/dL (ref 6.5–8.1)

## 2016-04-26 LAB — CBG MONITORING, ED: Glucose-Capillary: 139 mg/dL — ABNORMAL HIGH (ref 65–99)

## 2016-04-26 LAB — URINALYSIS, ROUTINE W REFLEX MICROSCOPIC
Bilirubin Urine: NEGATIVE
GLUCOSE, UA: NEGATIVE mg/dL
Ketones, ur: NEGATIVE mg/dL
Nitrite: NEGATIVE
PH: 6 (ref 5.0–8.0)
Protein, ur: NEGATIVE mg/dL
SPECIFIC GRAVITY, URINE: 1.011 (ref 1.005–1.030)

## 2016-04-26 LAB — I-STAT TROPONIN, ED
TROPONIN I, POC: 0 ng/mL (ref 0.00–0.08)
Troponin i, poc: 0.02 ng/mL (ref 0.00–0.08)

## 2016-04-26 LAB — URINE MICROSCOPIC-ADD ON

## 2016-04-26 NOTE — ED Provider Notes (Signed)
[facility]-EMERGENCY DEPT Provider Note   CSN: [id number] Arrival date & time: 04/26/16  1946     History   Chief Complaint Chief Complaint  Patient presents with  . Aphasia    HPI [name] is a 82 y.o. female.  The history is provided by the patient and a relative (daughter).  Weakness  Primary symptoms include focal weakness, loss of balance, speech change. This is a new problem. The current episode started 6 to 12 hours ago (sometime this morning, unclear onset). The problem has not changed since onset.There was no focality noted. There has been no fever. Associated symptoms include confusion. Pertinent negatives include no shortness of breath, no chest pain, no vomiting and no headaches. There were no medications administered prior to arrival.    Past Medical History:  Diagnosis Date  . Anxiety   . Arthritis    R Hip total Arthroplasty & L Hip Hemiarthroplasty  . CAD S/P percutaneous coronary angioplasty 2004   ZETA BMS 4.0 mm X 15 mm in RCA - 2004; CYPHER DES 2.715mm x 18 mm to LAD (with PTCA of jailed D2 with ostial 85%)& 2 in 2007  . Cataract    removed bil eyes  . Depression   . Diverticulosis   . Gastric outlet obstruction 2012   remote -- s/p Vagotomy & Pyeloroplasty  . GERD (gastroesophageal reflux disease)    TIA  . History of blood transfusion    With Hip Surgery  . Hyperlipidemia   . Hypertension    H/o Renovascular HTN, s/p R RA Stent in 06/2003  . Multiple allergies   . Osteoporosis    s/p L hip Fxr - Hemiarthroplasty  . PAF (paroxysmal atrial fibrillation) [facility](HCC) April 2015   Newly diagnosed by PCP  . PUD (peptic ulcer disease)    Gastric Ulcer  . Renal artery stenosis, native (HCC) 2003-2004   S/P R Renal A Stent 5 mm x 12 mm  . TIA (transient ischemic attack)     Patient Active Problem List   Diagnosis Date Noted  . CVA (cerebral vascular accident) (HCC) 04/26/2016  . Rash of back 05/21/2015  . Chronic diastolic heart failure (HCC)  01/02/725306/17/2016  . Chronic kidney disease stage III 01/07/2015  . Hypercalcemia 01/07/2015  . Dizziness 11/17/2014  . Permanent atrial fibrillation (HCC) 11/19/2013  . Exertional dyspnea 11/19/2013  . Closed left hip fracture (HCC) 11/25/2011  . Fall 11/25/2011  . Gastric outlet obstruction 05/04/2011  . NSAID-induced gastric ulcer 05/04/2011  . Nausea with vomiting 04/25/2011  . Loss of weight 04/25/2011  . Gastric ulcer, acute 04/25/2011  . History of vagotomy 04/25/2011  . GERD (gastroesophageal reflux disease) 04/24/2011  . Weight loss 04/24/2011  . Hyperlipidemia with target LDL less than 70 10/30/2007  . RENAL ARTERY STENOSIS 10/30/2007  . Peripheral vascular disease (HCC) 10/30/2007  . PEPTIC ULCER DISEASE 10/30/2007  . GASTROPARESIS 10/30/2007  . HIATAL HERNIA, HX OF 10/30/2007  . Essential hypertension 04/21/2007  . GERD 04/21/2007  . OSTEOARTHRITIS 04/21/2007  . GASTRITIS 03/03/2007  . GASTRIC OUTLET OBSTRUCTION 01/10/2004  . CAD S/P percutaneous coronary angioplasty: BMS to RCA in 2004; DES to LAD with PTCA of D2 in 2007;  patent in 2010 06/20/2002    Class: Diagnosis of  . ESOPHAGEAL STRICTURE 11/10/2001  . DIVERTICULOSIS, COLON 11/10/2001    Past Surgical History:  Procedure Laterality Date  . CARDIAC CATHETERIZATION  01/17/2009   normal L main, normal LAD, normal L Cfx, RCA with patent stent (Dr.  Erlene Quan)  . CATARACT EXTRACTION Bilateral   . COLONOSCOPY    . CORONARY ANGIOPLASTY WITH STENT PLACEMENT  2007   hx/notes 01/06/2015  . HIP ARTHROPLASTY  11/26/2011   Procedure: ARTHROPLASTY BIPOLAR HIP;  Surgeon: Javier Docker, MD;  Location: [facility] OR;  Service: Orthopedics;  Laterality: Left;  . JOINT REPLACEMENT    . NM MYOCAR PERF WALL MOTION  08/2007   persantine myoview - normal pattern of perfusion, EF 80%, low risk scan  . PERCUTANEOUS CORONARY STENT INTERVENTION (PCI-S)  06/24/2003   ACS Zeta BMS 4.0x64mm to RCA (Dr. Jonette Eva)  . PERCUTANEOUS CORONARY STENT  INTERVENTION (PCI-S)  11/13/2005   Cypher DES 2.5x24mm to LAD; Cutting PTCA of Ostial D2 (jailed) (Dr. Jonette Eva)  . RENAL ARTERY STENT  07/22/2003   Cordis 5x57mm stent to right renal artery (Dr. Jonette Eva)  . REPLACEMENT TOTAL HIP W/  RESURFACING IMPLANTS Right 04/2002  . TONSILLECTOMY    . TRANSTHORACIC ECHOCARDIOGRAM  11/23/2013; 01/07/2015:   a) EF 55-60%, no regional WMA; MAC without MR;; b) normal LV size and thickness. EF 50-55%. No RWMA, mild LA dilation with mod PR  . VAGOTOMY AND PYLOROPLASTY  01/13/04    OB History    No data available       Home Medications    Prior to Admission medications   Medication Sig Start Date End Date Taking? Authorizing Provider  acetaminophen (TYLENOL) 500 MG tablet Take 1,000 mg by mouth every 6 (six) hours as needed for mild pain.   Yes Historical Provider, MD  ALPRAZolam Prudy Feeler) 0.5 MG tablet Take 0.5 mg by mouth at bedtime.  10/17/15  Yes Historical Provider, MD  calcium gluconate 500 MG tablet Take 500 mg by mouth 2 (two) times daily.    Yes Historical Provider, MD  Cholecalciferol (VITAMIN D-3 PO) Take 1,000 mg by mouth daily.    Yes Historical Provider, MD  clopidogrel (PLAVIX) 75 MG tablet Take 75 mg by mouth daily.   Yes Historical Provider, MD  docusate sodium (COLACE) 100 MG capsule Take 100 mg by mouth 2 (two) times daily.     Yes Historical Provider, MD  folic acid (FOLVITE) 1 MG tablet Take 1 mg by mouth daily.     Yes Historical Provider, MD  HYDROcodone-acetaminophen (NORCO/VICODIN) 5-325 MG tablet TAKE 1 TABLET BY MOUTH EVERY 6 HOURS AS NEEDED FOR PAIN  **MUST LAST 30 DAYS PER MD** 10/11/15  Yes Historical Provider, MD  mirtazapine (REMERON) 30 MG tablet Take 30 mg by mouth at bedtime.  11/25/15  Yes Historical Provider, MD  Multiple Vitamin (MULITIVITAMIN WITH MINERALS) TABS Take 1 tablet by mouth daily.   Yes Historical Provider, MD  pantoprazole (PROTONIX) 40 MG tablet Take 1 tablet (40 mg total) by mouth 2 (two) times daily.  02/15/15  Yes Amy S Esterwood, PA-C  temazepam (RESTORIL) 30 MG capsule Take 30 mg by mouth at bedtime.   Yes Historical Provider, MD    Family History Family History  Problem Relation Age of Onset  . Hypertension Mother   . Stroke Mother   . Hypertension Father   . Stroke Father   . Uterine cancer Sister   . Heart disease Sister   . Diabetes Son   . Heart disease Brother   . Esophageal cancer Neg Hx   . Colon cancer Neg Hx   . Stomach cancer Neg Hx     Social History Social History  Substance Use Topics  . Smoking status: Never Smoker  .  Smokeless tobacco: Never Used  . Alcohol use No     Allergies   Review of patient's allergies indicates no known allergies.   Review of Systems Review of Systems  Constitutional: Negative for chills and fever.  HENT: Negative for facial swelling.   Eyes: Negative for visual disturbance.  Respiratory: Negative for cough and shortness of breath.   Cardiovascular: Negative for chest pain and palpitations.  Gastrointestinal: Negative for abdominal pain and vomiting.  Genitourinary: Negative for flank pain.  Musculoskeletal: Negative for arthralgias, back pain and neck pain.  Skin: Negative for color change, rash and wound.  Neurological: Positive for speech change, focal weakness, speech difficulty, weakness and loss of balance. Negative for seizures, syncope and headaches.  Psychiatric/Behavioral: Positive for confusion.  All other systems reviewed and are negative.    Physical Exam Updated Vital Signs BP 160/66   Pulse 62   Temp 98.1 F (36.7 C) (Oral)   Resp 20   Ht 4\' 9"  (1.448 m)   Wt 60.3 kg   SpO2 97%   BMI 28.78 kg/m   Physical Exam  Constitutional: She appears well-developed and well-nourished. No distress.  Pleasant, cooperative, elderly female, well-appearing  HENT:  Head: Normocephalic and atraumatic.  Right Ear: External ear normal.  Left Ear: External ear normal.  Eyes: Conjunctivae and EOM are normal.  Pupils are equal, round, and reactive to light. No scleral icterus.  Neck: Normal range of motion. Neck supple.  No C-spine tenderness  Cardiovascular: Normal rate, regular rhythm and intact distal pulses.   Pulmonary/Chest: Effort normal and breath sounds normal. No respiratory distress.  Abdominal: Soft. She exhibits no distension. There is no tenderness.  No flank hematomas  Musculoskeletal: Normal range of motion. She exhibits no edema, tenderness or deformity.  Intact active ROM of all 4 extremities. No spinal tenderness  Neurological: She is alert. She exhibits normal muscle tone. Coordination normal.  Oriented to person, situation, "hospital", not to year "19-something". Some nonsensical responses to questions but no slurring of speech. 4/5 strength with flexion/extension of right elbow, 4/5 hip flexion, 4/5 dorsi- and plantarflexion of RLE. 5/5 strength throughout LUE and LLE.   Skin: Skin is warm and dry. Capillary refill takes less than 2 seconds. She is not diaphoretic.  No wounds  Psychiatric: She has a normal mood and affect.  Nursing note and vitals reviewed.    ED Treatments / Results  Labs (all labs ordered are listed, but only abnormal results are displayed) Labs Reviewed  COMPREHENSIVE METABOLIC PANEL - Abnormal; Notable for the following:       Result Value   Glucose, Bld 140 (*)    BUN 29 (*)    Creatinine, Ser 1.10 (*)    GFR calc non Af Amer 41 (*)    GFR calc Af Amer 48 (*)    All other components within normal limits  URINALYSIS, ROUTINE W REFLEX MICROSCOPIC (NOT AT [facility]) - Abnormal; Notable for the following:    APPearance HAZY (*)    Hgb urine dipstick TRACE (*)    Leukocytes, UA LARGE (*)    All other components within normal limits  URINE MICROSCOPIC-ADD ON - Abnormal; Notable for the following:    Squamous Epithelial / LPF 0-5 (*)    Bacteria, UA MANY (*)    All other components within normal limits  CBG MONITORING, ED - Abnormal; Notable for the  following:    Glucose-Capillary 139 (*)    All other components within normal limits  CBC  WITH DIFFERENTIAL/PLATELET  Rosezena Sensor, ED  Rosezena Sensor, ED    EKG  EKG Interpretation  Date/Time:  Thursday April 26 2016 19:56:44 EDT Ventricular Rate:  71 PR Interval:    QRS Duration: 95 QT Interval:  395 QTC Calculation: 430 R Axis:   78 Text Interpretation:  Atrial fibrillation Inferior infarct, age indeterminate No previous ECGs available Confirmed by [facility] MD, ERIN ([id number]) on 04/26/2016 10:15:10 PM       Radiology Ct Head Wo Contrast  Result Date: 04/26/2016 CLINICAL DATA:  Became dizzy while sitting in a chair in the kitchen, fell and hit her head, abrasions to face, slight neck pain, found on floor at 0900 hours, slurred speech, history coronary artery disease post PTCA, PAF EXAM: CT HEAD WITHOUT CONTRAST CT CERVICAL SPINE WITHOUT CONTRAST TECHNIQUE: Multidetector CT imaging of the head and cervical spine was performed following the standard protocol without intravenous contrast. Multiplanar CT image reconstructions of the cervical spine were also generated. COMPARISON:  None FINDINGS: CT HEAD FINDINGS Brain: Generalized atrophy. Normal ventricular morphology. No midline shift or mass effect. Small vessel chronic ischemic changes of deep cerebral white matter. Artery low-attenuation at the anterior LEFT thalamus likely representing acute infarct. No intracranial hemorrhage, cortical infarct, mass lesion or extra-axial fluid collection. Vascular: Atherosclerotic calcification throughout the carotid siphons and at the vertebral arteries. Skull: Intact Sinuses/Orbits: Clear Other: N/A CT CERVICAL SPINE FINDINGS Alignment: Normal Skull base and vertebrae: Skullbase intact. Vertebral body heights maintained. Multilevel facet degenerative changes. No fracture or bone destruction. Significant degenerative changes and spur formation at the RIGHT C1-C2 articulation. Scattered  multilevel neural foraminal narrowing bilaterally by spurs. Soft tissues and spinal canal: Prevertebral soft tissues normal thickness. Spinal canal patent. Disc levels: Multilevel disc space narrowing and minimal endplate spur formation. Upper chest: Emphysematous but clear Other: Nonspecific tiny BILATERAL thyroid nodules bilaterally. Carotid atherosclerotic calcification bilaterally. IMPRESSION: Atrophy with small vessel chronic ischemic changes of deep cerebral white matter. Poorly defined low-attenuation within the anterior LEFT thalamus most consistent with acute infarct. No acute intracranial hemorrhage or cortical infarction. Multilevel degenerative disc and facet disease changes of the cervical spine. No acute cervical spine abnormalities. Electronically Signed   By: Ulyses Southward M.D.   On: 04/26/2016 21:07   Ct Cervical Spine Wo Contrast  Result Date: 04/26/2016 CLINICAL DATA:  Became dizzy while sitting in a chair in the kitchen, fell and hit her head, abrasions to face, slight neck pain, found on floor at 0900 hours, slurred speech, history coronary artery disease post PTCA, PAF EXAM: CT HEAD WITHOUT CONTRAST CT CERVICAL SPINE WITHOUT CONTRAST TECHNIQUE: Multidetector CT imaging of the head and cervical spine was performed following the standard protocol without intravenous contrast. Multiplanar CT image reconstructions of the cervical spine were also generated. COMPARISON:  None FINDINGS: CT HEAD FINDINGS Brain: Generalized atrophy. Normal ventricular morphology. No midline shift or mass effect. Small vessel chronic ischemic changes of deep cerebral white matter. Artery low-attenuation at the anterior LEFT thalamus likely representing acute infarct. No intracranial hemorrhage, cortical infarct, mass lesion or extra-axial fluid collection. Vascular: Atherosclerotic calcification throughout the carotid siphons and at the vertebral arteries. Skull: Intact Sinuses/Orbits: Clear Other: N/A CT CERVICAL  SPINE FINDINGS Alignment: Normal Skull base and vertebrae: Skullbase intact. Vertebral body heights maintained. Multilevel facet degenerative changes. No fracture or bone destruction. Significant degenerative changes and spur formation at the RIGHT C1-C2 articulation. Scattered multilevel neural foraminal narrowing bilaterally by spurs. Soft tissues and spinal canal: Prevertebral soft tissues normal thickness. Spinal canal  patent. Disc levels: Multilevel disc space narrowing and minimal endplate spur formation. Upper chest: Emphysematous but clear Other: Nonspecific tiny BILATERAL thyroid nodules bilaterally. Carotid atherosclerotic calcification bilaterally. IMPRESSION: Atrophy with small vessel chronic ischemic changes of deep cerebral white matter. Poorly defined low-attenuation within the anterior LEFT thalamus most consistent with acute infarct. No acute intracranial hemorrhage or cortical infarction. Multilevel degenerative disc and facet disease changes of the cervical spine. No acute cervical spine abnormalities. Electronically Signed   By: Ulyses Southward M.D.   On: 04/26/2016 21:07    Procedures Procedures (including critical care time)  Medications Ordered in ED Medications - No data to display   Initial Impression / Assessment and Plan / ED Course  I have reviewed the triage vital signs and the nursing notes.  Pertinent labs & imaging results that were available during my care of the patient were reviewed by me and considered in my medical decision making (see chart for details).  Clinical Course   MERLE WHITEHORN is a 82 y.o. female with h/o atrial fibrillation, CAD, no known h/o CVA, who presented to ED with daughter for evaluation of slurred and nonsensical speech and confusion that began sometime this morning. Pt was normal this morning after awakening, but found down on ground by daughter a couple hours later in the morning, confused and with slurred speech. Has not ambulated since.  Noted to have slight RUE and RLE weakness on exam. CT head with left thalamic infarct. As AMS was rapid-onset, doubt comorbid infection. Pt denies neck pain or chest pain. Admitted for further management.  Pt condition, course, and admission were discussed with attending physician Dr. Alvira Monday.   Final Clinical Impressions(s) / ED Diagnoses   Final diagnoses:  Cerebral infarction, unspecified mechanism [facility])    New Prescriptions New Prescriptions   No medications on file     Horald Pollen, MD 04/27/16 1610    Alvira Monday, MD 05/06/16 [phone]

## 2016-04-26 NOTE — ED Triage Notes (Signed)
Pt coming from home. 10 am pt was sitting in the chair in the kitchen and became dizzy and hit her head. Abrasions to face and slight neck pain. Found her on the floor. Neighbors say they found her at 9 am. Pt LSN 9 am. Slurred speech only deficit. Pt alert. Emesis x1 after fall.

## 2016-04-26 NOTE — Consult Note (Signed)
Admission H&P    Chief Complaint: New onset slurred speech, confusion and recurrent falls.  HPI: [name] is an 82 y.o. female history of TIA, PAF, hypertension, hyperlipidemia and coronary artery disease presenting with confusion and slurred speech as well as history of falling at home. She's been on Plavix daily. CT scan of her head showed low density area involving left thalamus, indicative of probable acute ischemic infarction. No facial droop was described. NIH stroke score at the time of this evaluation was 4.  LSN: 9 AM on 04/26/2016 tPA Given: No: Beyond time under for treatment consideration mRankin:  Past Medical History:  Diagnosis Date  . Anxiety   . Arthritis    R Hip total Arthroplasty & L Hip Hemiarthroplasty  . CAD S/P percutaneous coronary angioplasty 2004   ZETA BMS 4.0 mm X 15 mm in RCA - 2004; CYPHER DES 2.60m x 18 mm to LAD (with PTCA of jailed D2 with ostial 85%)& 2 in 2007  . Cataract    removed bil eyes  . Depression   . Diverticulosis   . Gastric outlet obstruction 2012   remote -- s/p Vagotomy & Pyeloroplasty  . GERD (gastroesophageal reflux disease)    TIA  . History of blood transfusion    With Hip Surgery  . Hyperlipidemia   . Hypertension    H/o Renovascular HTN, s/p R RA Stent in 06/2003  . Multiple allergies   . Osteoporosis    s/p L hip Fxr - Hemiarthroplasty  . PAF (paroxysmal atrial fibrillation) ([facility] April 2015   Newly diagnosed by PCP  . PUD (peptic ulcer disease)    Gastric Ulcer  . [place]   S/P R Renal A Stent 5 mm x 12 mm  . TIA (transient ischemic attack)     Past Surgical History:  Procedure Laterality Date  . CARDIAC CATHETERIZATION  01/17/2009   normal L main, normal LAD, normal L Cfx, RCA with patent stent (Dr. JAdora Fridge  . CATARACT EXTRACTION Bilateral   . COLONOSCOPY    . CORONARY ANGIOPLASTY WITH STENT PLACEMENT  2007   hx/notes 01/06/2015  . HIP ARTHROPLASTY  11/26/2011    Procedure: ARTHROPLASTY BIPOLAR HIP;  Surgeon: JJohnn Hai MD;  [place] [facility] [place]  Service: [place]opedics;  Laterality: Left;  . JOINT REPLACEMENT    . NM MYOCAR PERF WALL MOTION  08/2007   persantine myoview - normal pattern of perfusion, EF 80%, low risk scan  . PERCUTANEOUS CORONARY STENT INTERVENTION (PCI-S)  06/24/2003   ACS Zeta BMS 4.0x166mto RCA (Dr. R.Marella Chimes . PERCUTANEOUS CORONARY STENT INTERVENTION (PCI-S)  11/13/2005   Cypher DES 2.5x1831mo LAD; Cutting PTCA of Ostial D2 (jailed) (Dr. R. Marella Chimes. RENAL ARTERY STENT  07/22/2003   Cordis 5x12m43ment to right renal artery (Dr. R. WMarella Chimes REPLACEMENT TOTAL HIP W/  RESURFACING IMPLANTS Right 04/2002  . TONSILLECTOMY    . TRANSTHORACIC ECHOCARDIOGRAM  11/23/2013; 01/07/2015:   a) EF 55-60%, no regional WMA; MAC without MR;; b) normal LV size and thickness. EF 50-55%. No RWMA, mild LA dilation with mod PR  . VAGOTOMY AND PYLOROPLASTY  01/13/04    Family History  Problem Relation Age of Onset  . Hypertension Mother   . Stroke Mother   . Hypertension Father   . Stroke Father   . Uterine cancer Sister   . Heart disease Sister   . Diabetes Son   . Heart disease Brother   .  Esophageal cancer Neg Hx   . Colon cancer Neg Hx   . Stomach cancer Neg Hx    Social History:  reports that she has never smoked. She has never used smokeless tobacco. She reports that she does not drink alcohol or use drugs.  Allergies: No Known Allergies  Medications: Preadmission medications were reviewed by me.  ROS: Constitutional: Negative for chills and fever.  HENT: Negative for facial swelling.   Eyes: Negative for visual disturbance.  Respiratory: Negative for cough and shortness of breath.   Cardiovascular: Negative for chest pain and palpitations.  Gastrointestinal: Negative for abdominal pain and vomiting.  Genitourinary: Negative for flank pain.  Musculoskeletal: Negative for arthralgias, back pain and neck pain.  Skin:  Negative for color change, rash and wound.  Neurological: Positive for speech change, focal weakness, speech difficulty, weakness and loss of balance. Negative for seizures, syncope and headaches.  Psychiatric/Behavioral: Positive for confusion.  All other systems reviewed and are negative  Physical Examination: Blood pressure 160/66, pulse 62, temperature 98.1 F (36.7 C), temperature source Oral, resp. rate 20, height 4\' 9"  (1.448 m), weight 60.3 kg (133 lb), SpO2 97 %.  HEENT-  Normocephalic, no lesions, without obvious abnormality.  Normal external eye and conjunctiva.  Normal TM's bilaterally.  Normal auditory canals and external ears. Normal external nose, mucus membranes and septum.  Normal pharynx. Neck supple with no masses, nodes, nodules or enlargement. Cardiovascular - irregularly irregular rhythm, S1, S2 normal and no S3 or S4 Lungs - chest clear, no wheezing, rales, normal symmetric air entry Abdomen - soft, non-tender; bowel sounds normal; no masses,  no organomegaly Extremities - no joint deformities, effusion, or inflammation and no edema  Neurologic Examination: Mental Status: Alert, disoriented to current age and mouth, no acute distress.  Speech fluent without evidence of aphasia. Able to follow commands without difficulty. Cranial Nerves: II-Visual fields were normal. III/IV/VI-Pupils were equal and reacted normally to light. Extraocular movements were full and conjugate.    V/VII-no facial numbness; mild right lower facial weakness. VIII-normal. X-normal speech and symmetrical palatal movement. XI: trapezius strength/neck flexion strength normal bilaterally XII-midline tongue extension with normal strength. Motor: Mild proximal and distal weakness of right upper extremity as well as mild proximal weakness of right lower extremity. Strength of left extremities was normal. Sensory: Normal throughout. Deep Tendon Reflexes: 1+ and symmetric in upper extremities, 1+ at  left knee and absent at right knee. Plantars: Mute bilaterally Cerebellar: Moderately impaired coordination of right upper extremity. Carotid auscultation: Fairly loud right carotid bruit  Results for orders placed or performed during the hospital encounter of 04/26/16 (from the past 48 hour(s))  POC CBG, ED     Status: Abnormal   Collection Time: 04/26/16  7:58 PM  Result Value Ref Range   Glucose-Capillary 139 (H) 65 - 99 mg/dL  CBC with Differential     Status: None   Collection Time: 04/26/16  8:18 PM  Result Value Ref Range   WBC 10.0 4.0 - 10.5 K/uL   RBC 4.42 3.87 - 5.11 MIL/uL   Hemoglobin 14.1 12.0 - 15.0 g/dL   HCT 06/26/16 16.0 - 61.0 %   MCV 98.2 78.0 - 100.0 fL   MCH 31.9 26.0 - 34.0 pg   MCHC 32.5 30.0 - 36.0 g/dL   RDW 77.4 21.7 - 82.7 %   Platelets 165 150 - 400 K/uL   Neutrophils Relative % 75 %   Neutro Abs 7.4 1.7 - 7.7 K/uL   Lymphocytes  Relative 17 %   Lymphs Abs 1.7 0.7 - 4.0 K/uL   Monocytes Relative 7 %   Monocytes Absolute 0.7 0.1 - 1.0 K/uL   Eosinophils Relative 1 %   Eosinophils Absolute 0.1 0.0 - 0.7 K/uL   Basophils Relative 0 %   Basophils Absolute 0.0 0.0 - 0.1 K/uL  Comprehensive metabolic panel     Status: Abnormal   Collection Time: 04/26/16  8:18 PM  Result Value Ref Range   Sodium 139 135 - 145 mmol/L   Potassium 4.0 3.5 - 5.1 mmol/L   Chloride 105 101 - 111 mmol/L   CO2 23 22 - 32 mmol/L   Glucose, Bld 140 (H) 65 - 99 mg/dL   BUN 29 (H) 6 - 20 mg/dL   Creatinine, Ser 1.10 (H) 0.44 - 1.00 mg/dL   Calcium 10.3 8.9 - 10.3 mg/dL   Total Protein 7.2 6.5 - 8.1 g/dL   Albumin 3.7 3.5 - 5.0 g/dL   AST 22 15 - 41 U/L   ALT 17 14 - 54 U/L   Alkaline Phosphatase 78 38 - 126 U/L   Total Bilirubin 0.3 0.3 - 1.2 mg/dL   GFR calc non Af Amer 41 (L) >60 mL/min   GFR calc Af Amer 48 (L) >60 mL/min    Comment: (NOTE) The eGFR has been calculated using the CKD EPI equation. This calculation has not been validated in all clinical situations. eGFR's  persistently <60 mL/min signify possible Chronic Kidney Disease.    Anion gap 11 5 - 15  I-Stat Troponin, ED - 0, 3, 6 hours (not at [facility])     Status: None   Collection Time: 04/26/16  8:26 PM  Result Value Ref Range   Troponin i, poc 0.00 0.00 - 0.08 ng/mL   Comment 3            Comment: Due to the release kinetics of cTnI, a negative result within the first hours of the onset of symptoms does not rule out myocardial infarction with certainty. If myocardial infarction is still suspected, repeat the test at appropriate intervals.   Urinalysis, Routine w reflex microscopic (not at [facility])     Status: Abnormal   Collection Time: 04/26/16 10:03 PM  Result Value Ref Range   Color, Urine YELLOW YELLOW   APPearance HAZY (A) CLEAR   Specific Gravity, Urine 1.011 1.005 - 1.030   pH 6.0 5.0 - 8.0   Glucose, UA NEGATIVE NEGATIVE mg/dL   Hgb urine dipstick TRACE (A) NEGATIVE   Bilirubin Urine NEGATIVE NEGATIVE   Ketones, ur NEGATIVE NEGATIVE mg/dL   Protein, ur NEGATIVE NEGATIVE mg/dL   Nitrite NEGATIVE NEGATIVE   Leukocytes, UA LARGE (A) NEGATIVE  Urine microscopic-add on     Status: Abnormal   Collection Time: 04/26/16 10:03 PM  Result Value Ref Range   Squamous Epithelial / LPF 0-5 (A) NONE SEEN   WBC, UA 6-30 0 - 5 WBC/hpf   RBC / HPF 0-5 0 - 5 RBC/hpf   Bacteria, UA MANY (A) NONE SEEN  I-Stat Troponin, ED - 0, 3, 6 hours (not at [facility])     Status: None   Collection Time: 04/26/16 11:07 PM  Result Value Ref Range   Troponin i, poc 0.02 0.00 - 0.08 ng/mL   Comment 3            Comment: Due to the release kinetics of cTnI, a negative result within the first hours of the onset of symptoms does not  rule out myocardial infarction with certainty. If myocardial infarction is still suspected, repeat the test at appropriate intervals.    Ct Head Wo Contrast  Result Date: 04/26/2016 CLINICAL DATA:  Became dizzy while sitting in a chair in the kitchen, fell and hit her head, abrasions to  face, slight neck pain, found on floor at 0900 hours, slurred speech, history coronary artery disease post PTCA, PAF EXAM: CT HEAD WITHOUT CONTRAST CT CERVICAL SPINE WITHOUT CONTRAST TECHNIQUE: Multidetector CT imaging of the head and cervical spine was performed following the standard protocol without intravenous contrast. Multiplanar CT image reconstructions of the cervical spine were also generated. COMPARISON:  None FINDINGS: CT HEAD FINDINGS Brain: Generalized atrophy. Normal ventricular morphology. No midline shift or mass effect. Small vessel chronic ischemic changes of deep cerebral white matter. Artery low-attenuation at the anterior LEFT thalamus likely representing acute infarct. No intracranial hemorrhage, cortical infarct, mass lesion or extra-axial fluid collection. Vascular: Atherosclerotic calcification throughout the carotid siphons and at the vertebral arteries. Skull: Intact Sinuses/Orbits: Clear Other: N/A CT CERVICAL SPINE FINDINGS Alignment: Normal Skull base and vertebrae: Skullbase intact. Vertebral body heights maintained. Multilevel facet degenerative changes. No fracture or bone destruction. Significant degenerative changes and spur formation at the RIGHT C1-C2 articulation. Scattered multilevel neural foraminal narrowing bilaterally by spurs. Soft tissues and spinal canal: Prevertebral soft tissues normal thickness. Spinal canal patent. Disc levels: Multilevel disc space narrowing and minimal endplate spur formation. Upper chest: Emphysematous but clear Other: Nonspecific tiny BILATERAL thyroid nodules bilaterally. Carotid atherosclerotic calcification bilaterally. IMPRESSION: Atrophy with small vessel chronic ischemic changes of deep cerebral white matter. Poorly defined low-attenuation within the anterior LEFT thalamus most consistent with acute infarct. No acute intracranial hemorrhage or cortical infarction. Multilevel degenerative disc and facet disease changes of the cervical spine.  No acute cervical spine abnormalities. Electronically Signed   By: Ulyses Southward M.D.   On: 04/26/2016 21:07   Ct Cervical Spine Wo Contrast  Result Date: 04/26/2016 CLINICAL DATA:  Became dizzy while sitting in a chair in the kitchen, fell and hit her head, abrasions to face, slight neck pain, found on floor at 0900 hours, slurred speech, history coronary artery disease post PTCA, PAF EXAM: CT HEAD WITHOUT CONTRAST CT CERVICAL SPINE WITHOUT CONTRAST TECHNIQUE: Multidetector CT imaging of the head and cervical spine was performed following the standard protocol without intravenous contrast. Multiplanar CT image reconstructions of the cervical spine were also generated. COMPARISON:  None FINDINGS: CT HEAD FINDINGS Brain: Generalized atrophy. Normal ventricular morphology. No midline shift or mass effect. Small vessel chronic ischemic changes of deep cerebral white matter. Artery low-attenuation at the anterior LEFT thalamus likely representing acute infarct. No intracranial hemorrhage, cortical infarct, mass lesion or extra-axial fluid collection. Vascular: Atherosclerotic calcification throughout the carotid siphons and at the vertebral arteries. Skull: Intact Sinuses/Orbits: Clear Other: N/A CT CERVICAL SPINE FINDINGS Alignment: Normal Skull base and vertebrae: Skullbase intact. Vertebral body heights maintained. Multilevel facet degenerative changes. No fracture or bone destruction. Significant degenerative changes and spur formation at the RIGHT C1-C2 articulation. Scattered multilevel neural foraminal narrowing bilaterally by spurs. Soft tissues and spinal canal: Prevertebral soft tissues normal thickness. Spinal canal patent. Disc levels: Multilevel disc space narrowing and minimal endplate spur formation. Upper chest: Emphysematous but clear Other: Nonspecific tiny BILATERAL thyroid nodules bilaterally. Carotid atherosclerotic calcification bilaterally. IMPRESSION: Atrophy with small vessel chronic ischemic  changes of deep cerebral white matter. Poorly defined low-attenuation within the anterior LEFT thalamus most consistent with acute infarct. No acute intracranial hemorrhage  or cortical infarction. Multilevel degenerative disc and facet disease changes of the cervical spine. No acute cervical spine abnormalities. Electronically Signed   By: Lavonia Dana M.D.   On: 04/26/2016 21:07    Assessment: 82 y.o. female with multiple risk factors for stroke presenting with acute ischemic thalamic infarction.  Stroke Risk Factors - atrial fibrillation, hyperlipidemia and hypertension  Plan: 1. HgbA1c, fasting lipid panel 2. MRI, MRA  of the brain without contrast 3. PT consult, OT consult, Speech consult 4. Echocardiogram 5. Carotid dopplers 6. Prophylactic therapy-Antiplatelet med: Plavix 7. Risk factor modification 8. Telemetry monitoring  C.R. Nicole Kindred, MD [facility] [phone]  04/26/2016, 11:40 PM

## 2016-04-27 ENCOUNTER — Observation Stay (HOSPITAL_COMMUNITY): Payer: Commercial Managed Care - HMO

## 2016-04-27 ENCOUNTER — Encounter (HOSPITAL_COMMUNITY): Payer: Self-pay | Admitting: Internal Medicine

## 2016-04-27 DIAGNOSIS — N3000 Acute cystitis without hematuria: Secondary | ICD-10-CM | POA: Diagnosis not present

## 2016-04-27 DIAGNOSIS — Y92000 Kitchen of unspecified non-institutional (private) residence as  the place of occurrence of the external cause: Secondary | ICD-10-CM | POA: Diagnosis not present

## 2016-04-27 DIAGNOSIS — I6789 Other cerebrovascular disease: Secondary | ICD-10-CM | POA: Diagnosis not present

## 2016-04-27 DIAGNOSIS — R296 Repeated falls: Secondary | ICD-10-CM | POA: Diagnosis present

## 2016-04-27 DIAGNOSIS — S0091XA Abrasion of unspecified part of head, initial encounter: Secondary | ICD-10-CM | POA: Diagnosis present

## 2016-04-27 DIAGNOSIS — I482 Chronic atrial fibrillation: Secondary | ICD-10-CM

## 2016-04-27 DIAGNOSIS — I6330 Cerebral infarction due to thrombosis of unspecified cerebral artery: Secondary | ICD-10-CM

## 2016-04-27 DIAGNOSIS — I2510 Atherosclerotic heart disease of native coronary artery without angina pectoris: Secondary | ICD-10-CM | POA: Diagnosis present

## 2016-04-27 DIAGNOSIS — I5032 Chronic diastolic (congestive) heart failure: Secondary | ICD-10-CM | POA: Diagnosis present

## 2016-04-27 DIAGNOSIS — Z955 Presence of coronary angioplasty implant and graft: Secondary | ICD-10-CM | POA: Diagnosis not present

## 2016-04-27 DIAGNOSIS — I10 Essential (primary) hypertension: Secondary | ICD-10-CM | POA: Diagnosis not present

## 2016-04-27 DIAGNOSIS — K219 Gastro-esophageal reflux disease without esophagitis: Secondary | ICD-10-CM | POA: Diagnosis present

## 2016-04-27 DIAGNOSIS — Z7902 Long term (current) use of antithrombotics/antiplatelets: Secondary | ICD-10-CM | POA: Diagnosis not present

## 2016-04-27 DIAGNOSIS — Z833 Family history of diabetes mellitus: Secondary | ICD-10-CM | POA: Diagnosis not present

## 2016-04-27 DIAGNOSIS — I636 Cerebral infarction due to cerebral venous thrombosis, nonpyogenic: Secondary | ICD-10-CM | POA: Diagnosis present

## 2016-04-27 DIAGNOSIS — R410 Disorientation, unspecified: Secondary | ICD-10-CM | POA: Diagnosis present

## 2016-04-27 DIAGNOSIS — W19XXXA Unspecified fall, initial encounter: Secondary | ICD-10-CM | POA: Diagnosis not present

## 2016-04-27 DIAGNOSIS — Z823 Family history of stroke: Secondary | ICD-10-CM | POA: Diagnosis not present

## 2016-04-27 DIAGNOSIS — Z8711 Personal history of peptic ulcer disease: Secondary | ICD-10-CM | POA: Diagnosis not present

## 2016-04-27 DIAGNOSIS — W08XXXA Fall from other furniture, initial encounter: Secondary | ICD-10-CM | POA: Diagnosis present

## 2016-04-27 DIAGNOSIS — Z9861 Coronary angioplasty status: Secondary | ICD-10-CM | POA: Diagnosis not present

## 2016-04-27 DIAGNOSIS — I639 Cerebral infarction, unspecified: Secondary | ICD-10-CM | POA: Diagnosis present

## 2016-04-27 DIAGNOSIS — Z8673 Personal history of transient ischemic attack (TIA), and cerebral infarction without residual deficits: Secondary | ICD-10-CM | POA: Diagnosis not present

## 2016-04-27 DIAGNOSIS — E785 Hyperlipidemia, unspecified: Secondary | ICD-10-CM | POA: Diagnosis present

## 2016-04-27 DIAGNOSIS — Z96643 Presence of artificial hip joint, bilateral: Secondary | ICD-10-CM | POA: Diagnosis present

## 2016-04-27 DIAGNOSIS — N390 Urinary tract infection, site not specified: Secondary | ICD-10-CM | POA: Diagnosis present

## 2016-04-27 DIAGNOSIS — Z8249 Family history of ischemic heart disease and other diseases of the circulatory system: Secondary | ICD-10-CM | POA: Diagnosis not present

## 2016-04-27 LAB — CBC
HCT: 42.6 % (ref 36.0–46.0)
HEMOGLOBIN: 14 g/dL (ref 12.0–15.0)
MCH: 32.3 pg (ref 26.0–34.0)
MCHC: 32.9 g/dL (ref 30.0–36.0)
MCV: 98.2 fL (ref 78.0–100.0)
Platelets: 173 10*3/uL (ref 150–400)
RBC: 4.34 MIL/uL (ref 3.87–5.11)
RDW: 14.5 % (ref 11.5–15.5)
WBC: 11.9 10*3/uL — ABNORMAL HIGH (ref 4.0–10.5)

## 2016-04-27 LAB — LIPID PANEL
Cholesterol: 147 mg/dL (ref 0–200)
HDL: 45 mg/dL (ref >40)
LDL CALC: 75 mg/dL (ref 0–99)
TRIGLYCERIDES: 136 mg/dL (ref <150)
Total CHOL/HDL Ratio: 3.3 RATIO
VLDL: 27 mg/dL (ref 0–40)

## 2016-04-27 LAB — COMPREHENSIVE METABOLIC PANEL
ALBUMIN: 3.6 g/dL (ref 3.5–5.0)
ALK PHOS: 73 U/L (ref 38–126)
ALT: 16 U/L (ref 14–54)
ANION GAP: 14 (ref 5–15)
AST: 22 U/L (ref 15–41)
BUN: 28 mg/dL — ABNORMAL HIGH (ref 6–20)
CHLORIDE: 103 mmol/L (ref 101–111)
CO2: 25 mmol/L (ref 22–32)
Calcium: 10.5 mg/dL — ABNORMAL HIGH (ref 8.9–10.3)
Creatinine, Ser: 1.03 mg/dL — ABNORMAL HIGH (ref 0.44–1.00)
GFR calc Af Amer: 52 mL/min — ABNORMAL LOW (ref >60)
GFR calc non Af Amer: 45 mL/min — ABNORMAL LOW (ref >60)
GLUCOSE: 98 mg/dL (ref 65–99)
POTASSIUM: 4.6 mmol/L (ref 3.5–5.1)
SODIUM: 142 mmol/L (ref 135–145)
Total Bilirubin: 0.4 mg/dL (ref 0.3–1.2)
Total Protein: 7.4 g/dL (ref 6.5–8.1)

## 2016-04-27 LAB — I-STAT TROPONIN, ED: Troponin i, poc: 0.01 ng/mL (ref 0.00–0.08)

## 2016-04-27 MED ORDER — ENOXAPARIN SODIUM 30 MG/0.3ML ~~LOC~~ SOLN
30.0000 mg | SUBCUTANEOUS | Status: DC
  Administered 2016-04-27 – 2016-04-30 (×4): 30 mg via SUBCUTANEOUS
  Filled 2016-04-27 (×5): qty 0.3

## 2016-04-27 MED ORDER — ALPRAZOLAM 0.5 MG PO TABS
0.5000 mg | ORAL_TABLET | Freq: Every day | ORAL | Status: DC
  Administered 2016-04-27 – 2016-04-29 (×3): 0.5 mg via ORAL
  Filled 2016-04-27 (×3): qty 1

## 2016-04-27 MED ORDER — CALCIUM CITRATE 950 (200 CA) MG PO TABS
100.0000 mg | ORAL_TABLET | Freq: Every day | ORAL | Status: DC
  Administered 2016-04-27 – 2016-04-30 (×4): 100 mg via ORAL
  Filled 2016-04-27 (×4): qty 1

## 2016-04-27 MED ORDER — RESOURCE THICKENUP CLEAR PO POWD
Freq: Once | ORAL | Status: AC
  Administered 2016-04-27: 12:00:00 via ORAL
  Filled 2016-04-27: qty 125

## 2016-04-27 MED ORDER — ADULT MULTIVITAMIN W/MINERALS CH
1.0000 | ORAL_TABLET | Freq: Every day | ORAL | Status: DC
  Administered 2016-04-27 – 2016-04-30 (×4): 1 via ORAL
  Filled 2016-04-27 (×4): qty 1

## 2016-04-27 MED ORDER — CALCIUM GLUCONATE 500 MG PO TABS
500.0000 mg | ORAL_TABLET | Freq: Two times a day (BID) | ORAL | Status: DC
  Filled 2016-04-27 (×2): qty 1

## 2016-04-27 MED ORDER — ASPIRIN 300 MG RE SUPP
300.0000 mg | Freq: Every day | RECTAL | Status: DC
  Filled 2016-04-27: qty 1

## 2016-04-27 MED ORDER — ASPIRIN EC 325 MG PO TBEC
325.0000 mg | DELAYED_RELEASE_TABLET | Freq: Every day | ORAL | Status: DC
  Administered 2016-04-27 – 2016-04-30 (×4): 325 mg via ORAL
  Filled 2016-04-27 (×4): qty 1

## 2016-04-27 MED ORDER — MIRTAZAPINE 15 MG PO TABS
30.0000 mg | ORAL_TABLET | Freq: Every day | ORAL | Status: DC
  Administered 2016-04-27 – 2016-04-29 (×3): 30 mg via ORAL
  Filled 2016-04-27 (×2): qty 2

## 2016-04-27 MED ORDER — SODIUM CHLORIDE 0.9 % IV SOLN
INTRAVENOUS | Status: DC
  Administered 2016-04-27 – 2016-04-29 (×4): via INTRAVENOUS

## 2016-04-27 MED ORDER — CLOPIDOGREL BISULFATE 75 MG PO TABS
75.0000 mg | ORAL_TABLET | Freq: Every day | ORAL | Status: DC
  Administered 2016-04-27: 75 mg via ORAL
  Filled 2016-04-27: qty 1

## 2016-04-27 MED ORDER — TEMAZEPAM 7.5 MG PO CAPS
30.0000 mg | ORAL_CAPSULE | Freq: Every day | ORAL | Status: DC
  Administered 2016-04-27 – 2016-04-29 (×3): 30 mg via ORAL
  Filled 2016-04-27 (×3): qty 4

## 2016-04-27 MED ORDER — FOLIC ACID 1 MG PO TABS
1.0000 mg | ORAL_TABLET | Freq: Every day | ORAL | Status: DC
  Administered 2016-04-27 – 2016-04-30 (×4): 1 mg via ORAL
  Filled 2016-04-27 (×4): qty 1

## 2016-04-27 MED ORDER — STROKE: EARLY STAGES OF RECOVERY BOOK
Freq: Once | Status: AC
  Administered 2016-04-27: 11:00:00
  Filled 2016-04-27: qty 1

## 2016-04-27 MED ORDER — HYDRALAZINE HCL 20 MG/ML IJ SOLN: 10.0000 mg | INTRAMUSCULAR | Status: DC | PRN

## 2016-04-27 MED ORDER — HYDROCODONE-ACETAMINOPHEN 5-325 MG PO TABS: 1.0000 | ORAL_TABLET | Freq: Four times a day (QID) | ORAL | Status: DC | PRN

## 2016-04-27 MED ORDER — ASPIRIN 300 MG RE SUPP: 300.0000 mg | Freq: Every day | RECTAL | Status: DC

## 2016-04-27 MED ORDER — ASPIRIN 325 MG PO TABS: 325.0000 mg | ORAL_TABLET | Freq: Every day | ORAL | Status: DC

## 2016-04-27 MED ORDER — DOCUSATE SODIUM 100 MG PO CAPS
100.0000 mg | ORAL_CAPSULE | Freq: Two times a day (BID) | ORAL | Status: DC
  Administered 2016-04-27 – 2016-04-30 (×7): 100 mg via ORAL
  Filled 2016-04-27 (×7): qty 1

## 2016-04-27 MED ORDER — SENNOSIDES-DOCUSATE SODIUM 8.6-50 MG PO TABS
1.0000 | ORAL_TABLET | Freq: Every evening | ORAL | Status: DC | PRN
  Filled 2016-04-27: qty 1

## 2016-04-27 MED ORDER — PANTOPRAZOLE SODIUM 40 MG PO TBEC
40.0000 mg | DELAYED_RELEASE_TABLET | Freq: Two times a day (BID) | ORAL | Status: DC
  Administered 2016-04-27 – 2016-04-30 (×7): 40 mg via ORAL
  Filled 2016-04-27 (×7): qty 1

## 2016-04-27 NOTE — Progress Notes (Signed)
Pt admitted to the unit from ED at 0930. Pt A&O x3; VSS; telemetry applied and verified with CCMD; NT called to second verify; pt  Skin dry intact except ecchymosis to bilateral hands; left elbow has healing scab; pink foam dsg applied; no open wound or pressure ulcer noted; pt oriented to the unit and room; fall safety precaution and prevention education completed with pt; bed alarm on, call light within reach; MD at bedside; will closely monitor pt. Dionne BucyP. Amo Yaa Donnellan RN

## 2016-04-27 NOTE — Care Management Note (Signed)
Case Management Note  Patient Details  Name: Jamal MaesLillie K Rogue MRN: [id number] Date of Birth: 05-13-1918  Subjective/Objective:      Pt admitted with CVA. She is from home. Found by neighbors on the floor.               Action/Plan: Awaiting PT/OT recommendations. CM following for discharge disposition.  Expected Discharge Date:                  Expected Discharge Plan:     In-House Referral:     Discharge planning Services     Post Acute Care Choice:    Choice offered to:     DME Arranged:    DME Agency:     HH Arranged:    HH Agency:     Status of Service:  In process, will continue to follow  If discussed at Long Length of Stay Meetings, dates discussed:    Additional Comments:  Kermit BaloKelli F Accalia Rigdon, RN 04/27/2016, 10:55 AM

## 2016-04-27 NOTE — Progress Notes (Signed)
PROGRESS NOTE    Diane MaesLillie K Odom  ZOX:[id number] DOB: Nov 23, 1917 DOA: 04/26/2016 PCP: [name], WILLIAM, MD   Brief Narrative:   [name] is a 82 y.o. female with history of CAD status post stenting, permanent A. fib, hypertension and diastolic CHF was brought to the ER to patient's family found the patient was having difficulty speaking and confusion. Patient's symptoms started yesterday morning after waking up. Patient also had a fall. In the ER patient was found to have mild right upper and lower extremity weakness. CT of the head shows left thalamic stroke. On call neurologist was consulted and patient is being admitted for further stroke workup. On my exam patient is alert awake oriented. Mild weakness in the right upper and lower extremity. Denies any headache visual symptoms or difficulty swallowing.    Assessment & Plan:   Active Problems:   Essential hypertension   CAD S/P percutaneous coronary angioplasty: BMS to RCA in 2004; DES to LAD with PTCA of D2 in 2007;  patent in 2010   Permanent atrial fibrillation (HCC)   Thrombotic stroke involving cerebral artery (HCC)   Stroke (cerebrum) (HCC)   Acute left thalamic stroke  - MRI/MRA brain today - carotid Doppler - f/u lipid panel and HgA1c - Patient on plavix at home  Hypertension uncontrolled  - patient has not been on any medications for hypertension prior to admissio - allow for permissive hypertension and closely follow blood pressure trends - IV hydralazine for systolic blood pressure more than 220 and diastolic more than 120  Permanent A. Fib  - rate controlled without any medications - Chads 2 vasc score is more than 2 - Patient is not on anticoagulation at patient's own wish (per admitting physician)  CAD status post stenting  - denies any chest pain - On Plavix - Intolerant to statins.  History of diastolic CHF  - no acute issues at this time.  UA showing possible UTI  - patient appears to be  asymptomatic - Will check urine culture and place on antibiotic accordingly   DVT prophylaxis: Lovenox Code Status: Full code Family Communication: No family present Disposition Plan: Pending evaluation by PT/OT    Consultants:   Neurology  PT  Procedures:   none  Antimicrobials:   none    Subjective: Patient laying  Comfortably in bed at time of evaluation.  She reports that she feels well.  Does not know why she is here.  Knows she is in the hospital.  Does not know what year it is or who the president is.  Does know where she lives. Patient denies any pain.    Objective: Vitals:   04/27/16 0654 04/27/16 0655 04/27/16 0926 04/27/16 1114  BP: 123/79  (!) 197/89 (!) 191/87  Pulse: 72 72 81 69  Resp: 15  16 20   Temp:   98.9 F (37.2 C) 98.5 F (36.9 C)  TempSrc:   Oral Oral  SpO2: 96% 96%  100%  Weight:      Height:        Intake/Output Summary (Last 24 hours) at 04/27/16 1128 Last data filed at 04/27/16 0217  Gross per 24 hour  Intake                0 ml  Output              800 ml  Net             -800 ml   [employer] PowerFiled Weights  04/26/16 1953  Weight: 60.3 kg (133 lb)    Examination:  General exam: Appears calm and comfortable  Respiratory system: Clear to auscultation. Respiratory effort normal. Cardiovascular system: S1 & S2 heard, RRR. No JVD, murmurs, rubs, gallops or clicks. No pedal edema. Gastrointestinal system: Abdomen is nondistended, soft and nontender. No organomegaly or masses felt. Normal bowel sounds heard. Central nervous system: Alert and oriented. No focal neurological deficits. Extremities: slight weakness of right UE and LE.  CN II-XII grossly intact with no deficits appreciated. Skin: No rashes, lesions or ulcers Psychiatry: Oriented to person and place. Mood & affect appropriate. Poor insight    Data Reviewed: I have personally reviewed following labs and imaging studies  CBC:  Recent Labs Lab 04/26/16 2018 04/27/16 0421    WBC 10.0 11.9*  NEUTROABS 7.4  --   HGB 14.1 14.0  HCT 43.4 42.6  MCV 98.2 98.2  PLT 165 173   Basic Metabolic Panel:  Recent Labs Lab 04/26/16 2018 04/27/16 0507  NA 139 142  K 4.0 4.6  CL 105 103  CO2 23 25  GLUCOSE 140* 98  BUN 29* 28*  CREATININE 1.10* 1.03*  CALCIUM 10.3 10.5*   GFR: Estimated Creatinine Clearance: 24.4 mL/min (by C-G formula based on SCr of 1.03 mg/dL (H)). Liver Function Tests:  Recent Labs Lab 04/26/16 2018 04/27/16 0507  AST 22 22  ALT 17 16  ALKPHOS 78 73  BILITOT 0.3 0.4  PROT 7.2 7.4  ALBUMIN 3.7 3.6   No results for input(s): LIPASE, AMYLASE in the last 168 hours. No results for input(s): AMMONIA in the last 168 hours. Coagulation Profile: No results for input(s): INR, PROTIME in the last 168 hours. Cardiac Enzymes: No results for input(s): CKTOTAL, CKMB, CKMBINDEX, TROPONINI in the last 168 hours. BNP (last 3 results) No results for input(s): PROBNP in the last 8760 hours. HbA1C: No results for input(s): HGBA1C in the last 72 hours. CBG:  Recent Labs Lab 04/26/16 1958  GLUCAP 139*   Lipid Profile:  Recent Labs  04/27/16 0203  CHOL 147  HDL 45  LDLCALC 75  TRIG 136  CHOLHDL 3.3   Thyroid Function Tests: No results for input(s): TSH, T4TOTAL, FREET4, T3FREE, THYROIDAB in the last 72 hours. Anemia Panel: No results for input(s): VITAMINB12, FOLATE, FERRITIN, TIBC, IRON, RETICCTPCT in the last 72 hours. Sepsis Labs: No results for input(s): PROCALCITON, LATICACIDVEN in the last 168 hours.  No results found for this or any previous visit (from the past 240 hour(s)).       Radiology Studies: Ct Head Wo Contrast  Result Date: 04/26/2016 CLINICAL DATA:  Became dizzy while sitting in a chair in the kitchen, fell and hit her head, abrasions to face, slight neck pain, found on floor at 0900 hours, slurred speech, history coronary artery disease post PTCA, PAF EXAM: CT HEAD WITHOUT CONTRAST CT CERVICAL SPINE WITHOUT  CONTRAST TECHNIQUE: Multidetector CT imaging of the head and cervical spine was performed following the standard protocol without intravenous contrast. Multiplanar CT image reconstructions of the cervical spine were also generated. COMPARISON:  None FINDINGS: CT HEAD FINDINGS Brain: Generalized atrophy. Normal ventricular morphology. No midline shift or mass effect. Small vessel chronic ischemic changes of deep cerebral white matter. Artery low-attenuation at the anterior LEFT thalamus likely representing acute infarct. No intracranial hemorrhage, cortical infarct, mass lesion or extra-axial fluid collection. Vascular: Atherosclerotic calcification throughout the carotid siphons and at the vertebral arteries. Skull: Intact Sinuses/Orbits: Clear Other: N/A CT CERVICAL SPINE  FINDINGS Alignment: Normal Skull base and vertebrae: Skullbase intact. Vertebral body heights maintained. Multilevel facet degenerative changes. No fracture or bone destruction. Significant degenerative changes and spur formation at the RIGHT C1-C2 articulation. Scattered multilevel neural foraminal narrowing bilaterally by spurs. Soft tissues and spinal canal: Prevertebral soft tissues normal thickness. Spinal canal patent. Disc levels: Multilevel disc space narrowing and minimal endplate spur formation. Upper chest: Emphysematous but clear Other: Nonspecific tiny BILATERAL thyroid nodules bilaterally. Carotid atherosclerotic calcification bilaterally. IMPRESSION: Atrophy with small vessel chronic ischemic changes of deep cerebral white matter. Poorly defined low-attenuation within the anterior LEFT thalamus most consistent with acute infarct. No acute intracranial hemorrhage or cortical infarction. Multilevel degenerative disc and facet disease changes of the cervical spine. No acute cervical spine abnormalities. Electronically Signed   By: Ulyses Southward M.D.   On: 04/26/2016 21:07   Ct Cervical Spine Wo Contrast  Result Date:  04/26/2016 CLINICAL DATA:  Became dizzy while sitting in a chair in the kitchen, fell and hit her head, abrasions to face, slight neck pain, found on floor at 0900 hours, slurred speech, history coronary artery disease post PTCA, PAF EXAM: CT HEAD WITHOUT CONTRAST CT CERVICAL SPINE WITHOUT CONTRAST TECHNIQUE: Multidetector CT imaging of the head and cervical spine was performed following the standard protocol without intravenous contrast. Multiplanar CT image reconstructions of the cervical spine were also generated. COMPARISON:  None FINDINGS: CT HEAD FINDINGS Brain: Generalized atrophy. Normal ventricular morphology. No midline shift or mass effect. Small vessel chronic ischemic changes of deep cerebral white matter. Artery low-attenuation at the anterior LEFT thalamus likely representing acute infarct. No intracranial hemorrhage, cortical infarct, mass lesion or extra-axial fluid collection. Vascular: Atherosclerotic calcification throughout the carotid siphons and at the vertebral arteries. Skull: Intact Sinuses/Orbits: Clear Other: N/A CT CERVICAL SPINE FINDINGS Alignment: Normal Skull base and vertebrae: Skullbase intact. Vertebral body heights maintained. Multilevel facet degenerative changes. No fracture or bone destruction. Significant degenerative changes and spur formation at the RIGHT C1-C2 articulation. Scattered multilevel neural foraminal narrowing bilaterally by spurs. Soft tissues and spinal canal: Prevertebral soft tissues normal thickness. Spinal canal patent. Disc levels: Multilevel disc space narrowing and minimal endplate spur formation. Upper chest: Emphysematous but clear Other: Nonspecific tiny BILATERAL thyroid nodules bilaterally. Carotid atherosclerotic calcification bilaterally. IMPRESSION: Atrophy with small vessel chronic ischemic changes of deep cerebral white matter. Poorly defined low-attenuation within the anterior LEFT thalamus most consistent with acute infarct. No acute  intracranial hemorrhage or cortical infarction. Multilevel degenerative disc and facet disease changes of the cervical spine. No acute cervical spine abnormalities. Electronically Signed   By: Ulyses Southward M.D.   On: 04/26/2016 21:07   Mr Brain Wo Contrast  Result Date: 04/27/2016 CLINICAL DATA:  Slurred speech and fall at home. Followup probable LEFT thalamus infarct. History of hypertension, hyperlipidemia. EXAM: MRI HEAD WITHOUT CONTRAST MRA HEAD WITHOUT CONTRAST TECHNIQUE: Multiplanar, multiecho pulse sequences of the brain and surrounding structures were obtained without intravenous contrast. Angiographic images of the head were obtained using MRA technique without contrast. COMPARISON:  CT HEAD April 26, 2016 and MRI of the brain July 29, 2007 FINDINGS: MRI HEAD FINDINGS BRAIN: 9 x 13 mm ovoid focus of reduced diffusion mesial LEFT thalamus with corresponding low ADC values and bright FLAIR T2 hyperintense signal. No susceptibility artifact to suggest hemorrhage. The ventricles and sulci are normal for patient's age. No suspicious parenchymal signal, masses or mass effect. Patchy supratentorial white matter FLAIR T2 hyperintensities. Old small LEFT cerebellar infarcts. No abnormal extra-axial fluid collections.  No extra-axial masses though, contrast enhanced sequences would be more sensitive. VASCULAR: Normal major intracranial vascular flow voids present at skull base. SKULL AND UPPER CERVICAL SPINE: No abnormal sellar expansion. No suspicious calvarial bone marrow signal. Craniocervical junction maintained. SINUSES/ORBITS: Minimal paranasal sinus mucosal thickening. Mild RIGHT mastoid effusion. Status post bilateral ocular lens implants. The included ocular globes and orbital contents are non-suspicious. OTHER: Patient is edentulous. MRA HEAD FINDINGS ANTERIOR CIRCULATION: Normal flow related enhancement of the included cervical, petrous, cavernous and supraclinoid internal carotid arteries. Patent  anterior communicating artery. Normal flow related enhancement of the anterior and middle cerebral arteries, including distal segments. No large vessel occlusion, high-grade stenosis, abnormal luminal irregularity, aneurysm. POSTERIOR CIRCULATION: LEFT vertebral artery is dominant. Proximal basilar artery is patent with normal flow related enhancement. Poor visualization of basilar tip. Normal flow related enhancement of the posterior cerebral arteries. Robust bilateral posterior communicating arteries present, possible fetal origin. No large vessel occlusion, high-grade stenosis, abnormal luminal irregularity, aneurysm. IMPRESSION: MRI HEAD: Acute 9 x 13 mm LEFT thalamus infarct corresponding to CT abnormality. Old small LEFT cerebellar infarcts. Mild chronic small vessel ischemic disease. MRA HEAD: No emergent large vessel occlusion or severe stenosis. Slow flow versus occluded/ developmentally absent basilar tip with complete, robust circle of Willis. Electronically Signed   By: Awilda Metro M.D.   On: 04/27/2016 06:21   Dg Pelvis Portable  Result Date: 04/27/2016 CLINICAL DATA:  Fall. EXAM: PORTABLE PELVIS 1-2 VIEWS COMPARISON:  05/09/2011 . FINDINGS: Degenerative changes lumbar spine. Bilateral hip replacements. Diffuse osteopenia. No acute bony abnormality. If symptoms persist full pelvic series can be obtained. IMPRESSION: No acute abnormality. Electronically Signed   By: Maisie Fus  Register   On: 04/27/2016 07:34   Mr Maxine Glenn Head/brain Wo Cm  Result Date: 04/27/2016 CLINICAL DATA:  Slurred speech and fall at home. Followup probable LEFT thalamus infarct. History of hypertension, hyperlipidemia. EXAM: MRI HEAD WITHOUT CONTRAST MRA HEAD WITHOUT CONTRAST TECHNIQUE: Multiplanar, multiecho pulse sequences of the brain and surrounding structures were obtained without intravenous contrast. Angiographic images of the head were obtained using MRA technique without contrast. COMPARISON:  CT HEAD April 26, 2016  and MRI of the brain July 29, 2007 FINDINGS: MRI HEAD FINDINGS BRAIN: 9 x 13 mm ovoid focus of reduced diffusion mesial LEFT thalamus with corresponding low ADC values and bright FLAIR T2 hyperintense signal. No susceptibility artifact to suggest hemorrhage. The ventricles and sulci are normal for patient's age. No suspicious parenchymal signal, masses or mass effect. Patchy supratentorial white matter FLAIR T2 hyperintensities. Old small LEFT cerebellar infarcts. No abnormal extra-axial fluid collections. No extra-axial masses though, contrast enhanced sequences would be more sensitive. VASCULAR: Normal major intracranial vascular flow voids present at skull base. SKULL AND UPPER CERVICAL SPINE: No abnormal sellar expansion. No suspicious calvarial bone marrow signal. Craniocervical junction maintained. SINUSES/ORBITS: Minimal paranasal sinus mucosal thickening. Mild RIGHT mastoid effusion. Status post bilateral ocular lens implants. The included ocular globes and orbital contents are non-suspicious. OTHER: Patient is edentulous. MRA HEAD FINDINGS ANTERIOR CIRCULATION: Normal flow related enhancement of the included cervical, petrous, cavernous and supraclinoid internal carotid arteries. Patent anterior communicating artery. Normal flow related enhancement of the anterior and middle cerebral arteries, including distal segments. No large vessel occlusion, high-grade stenosis, abnormal luminal irregularity, aneurysm. POSTERIOR CIRCULATION: LEFT vertebral artery is dominant. Proximal basilar artery is patent with normal flow related enhancement. Poor visualization of basilar tip. Normal flow related enhancement of the posterior cerebral arteries. Robust bilateral posterior communicating arteries present, possible fetal  origin. No large vessel occlusion, high-grade stenosis, abnormal luminal irregularity, aneurysm. IMPRESSION: MRI HEAD: Acute 9 x 13 mm LEFT thalamus infarct corresponding to CT abnormality. Old small  LEFT cerebellar infarcts. Mild chronic small vessel ischemic disease. MRA HEAD: No emergent large vessel occlusion or severe stenosis. Slow flow versus occluded/ developmentally absent basilar tip with complete, robust circle of Willis. Electronically Signed   By: Awilda Metro M.D.   On: 04/27/2016 06:21        Scheduled Meds: . ALPRAZolam  0.5 mg Oral QHS  . calcium citrate  100 mg of elemental calcium Oral Daily  . clopidogrel  75 mg Oral Daily  . docusate sodium  100 mg Oral BID  . enoxaparin (LOVENOX) injection  30 mg Subcutaneous Q24H  . folic acid  1 mg Oral Daily  . mirtazapine  30 mg Oral QHS  . multivitamin with minerals  1 tablet Oral Daily  . pantoprazole  40 mg Oral BID  . RESOURCE THICKENUP CLEAR   Oral Once  . temazepam  30 mg Oral QHS   Continuous Infusions: . sodium chloride 50 mL/hr at 04/27/16 1110     LOS: 0 days    Time spent: 40 minutes    Bennett Scrape, MD [facility] Pager [phone]  If 7PM-7AM, please contact night-coverage [email or web address] Password [email or web address] 04/27/2016, 11:28 AM

## 2016-04-27 NOTE — Evaluation (Signed)
Clinical/Bedside Swallow Evaluation Patient Details  Name: [name] MRN: [id number] Date of Birth: [birth year]  Today's Date: 04/27/2016 Time: SLP Start Time (ACUTE ONLY): 1015 SLP Stop Time (ACUTE ONLY): 1045 SLP Time Calculation (min) (ACUTE ONLY): 30 min  Past Medical History:  Past Medical History:  Diagnosis Date  . Anxiety   . Arthritis    R Hip total Arthroplasty & L Hip Hemiarthroplasty  . CAD S/P percutaneous coronary angioplasty 2004   ZETA BMS 4.0 mm X 15 mm in RCA - 2004; CYPHER DES 2.7mm x 18 mm to LAD (with PTCA of jailed D2 with ostial 85%)& 2 in 2007  . Cataract    removed bil eyes  . Depression   . Diverticulosis   . Gastric outlet obstruction 2012   remote -- s/p Vagotomy & Pyeloroplasty  . GERD (gastroesophageal reflux disease)    TIA  . History of blood transfusion    With Hip Surgery  . Hyperlipidemia   . Hypertension    H/o Renovascular HTN, s/p R RA Stent in 06/2003  . Multiple allergies   . Osteoporosis    s/p L hip Fxr - Hemiarthroplasty  . PAF (paroxysmal atrial fibrillation) [facility]) April 2015   Newly diagnosed by PCP  . PUD (peptic ulcer disease)    Gastric Ulcer  . Renal artery stenosis, native (HCC) 2003-2004   S/P R Renal A Stent 5 mm x 12 mm  . Stroke (HCC) 04/2016  . TIA (transient ischemic attack)    Past Surgical History:  Past Surgical History:  Procedure Laterality Date  . CARDIAC CATHETERIZATION  01/17/2009   normal L main, normal LAD, normal L Cfx, RCA with patent stent (Dr. Erlene Quan)  . CATARACT EXTRACTION Bilateral   . COLONOSCOPY    . CORONARY ANGIOPLASTY WITH STENT PLACEMENT  2007   hx/notes 01/06/2015  . HIP ARTHROPLASTY  11/26/2011   Procedure: ARTHROPLASTY BIPOLAR HIP;  Surgeon: Javier Docker, MD;  Location: [facility] OR;  Service: Orthopedics;  Laterality: Left;  . JOINT REPLACEMENT    . NM MYOCAR PERF WALL MOTION  08/2007   persantine myoview - normal pattern of perfusion, EF 80%, low risk scan  . PERCUTANEOUS CORONARY  STENT INTERVENTION (PCI-S)  06/24/2003   ACS Zeta BMS 4.0x35mm to RCA (Dr. Jonette Eva)  . PERCUTANEOUS CORONARY STENT INTERVENTION (PCI-S)  11/13/2005   Cypher DES 2.5x32mm to LAD; Cutting PTCA of Ostial D2 (jailed) (Dr. Jonette Eva)  . RENAL ARTERY STENT  07/22/2003   Cordis 5x6mm stent to right renal artery (Dr. Jonette Eva)  . REPLACEMENT TOTAL HIP W/  RESURFACING IMPLANTS Right 04/2002  . TONSILLECTOMY    . TRANSTHORACIC ECHOCARDIOGRAM  11/23/2013; 01/07/2015:   a) EF 55-60%, no regional WMA; MAC without MR;; b) normal LV size and thickness. EF 50-55%. No RWMA, mild LA dilation with mod PR  . VAGOTOMY AND PYLOROPLASTY  01/13/04   HPI:  82 y.o.femalewith history of CAD status post stenting, permanent A. fib, hypertension and diastolic CHF admitted with acute confusion, weakness RUE/LE.  MRI Cute 9x13 mm left thalamic CVA.  Esophagram 04/04/15: moderate retention above the narrowing but a 13 mm barium pill successfully traversed the stenosis..   Assessment / Plan / Recommendation Clinical Impression  Pt presents with a chronic esophageal dysphagia notable for retention; mild acute changes in oropharyngeal function.  Demonstrated no new CN involvement; mild confusion.  Pt with adequate oral recognition/mastication.  Consumed regular solids, purees without difficulty. Thin liquids elicited delayed cough, concerning  for potential penetration/aspiration; nectar-thick liquids were better tolerated.  For today, start dysphagia 3 diet with nectar-thick liquids; meds whole in puree.  Dtr educated and sign posted at [facility].  SLP will follow for toleration/diet progression.     Asp[facility] Risk  [facility]    Diet Recommendation   dysphagia 3, nectar thick liquids  Medication Administration: Whole meds with puree    Other  Recommendations Oral Care Recommendations: Oral care BID Other Recommendations: Order thickener from pharmacy   Follow up Recommendations  (tba)      Frequency and  Duration min 2x/week  1 week       Prognosis Prognosis for Safe Diet Advancement: Good      Swallow Study   General Date of Onset: 04/26/16 HPI: 82 y.o.femalewith history of CAD status post stenting, permanent A. fib, hypertension and diastolic CHF admitted with acute confusion, weakness RUE/LE.  MRI Cute 9x13 mm left thalamic CVA.  Esophagram 04/04/15: moderate retention above the narrowing but a 13 mm barium pill successfully traversed the stenosis.. Type of Study: Bedside Swallow Evaluation Diet Prior to this Study: NPO Temperature Spikes Noted: No Respiratory Status: Room air History of Recent Intubation: No Behavior/Cognition: Alert;Cooperative;Confused Oral Cavity Assessment: Within Functional Limits Oral Care Completed by SLP: Yes Oral Cavity - Dentition: Dentures, top;Dentures, bottom Vision: Functional for self-feeding Self-Feeding Abilities: Able to feed self Patient Positioning: Upright in bed Baseline Vocal Quality: Hoarse Volitional Cough: Strong Volitional Swallow: Able to elicit    Oral/Motor/Sensory Function Overall Oral Motor/Sensory Function: Within functional limits   Ice Chips Ice chips: Within functional limits   Thin Liquid Thin Liquid: Impaired Presentation: Cup;Straw Pharyngeal  Phase Impairments: Cough - Delayed    Nectar Thick Nectar Thick Liquid: Within functional limits Presentation: Cup;Straw   Honey Thick Honey Thick Liquid: Not tested   Puree Puree: Within functional limits   Solid   GO   Solid: Within functional limits    Functional Assessment Tool Used: clinical judgment Functional Limitations: Swallowing Swallow Current Status ([id number]): At least 1 percent but less than 20 percent impaired, limited or restricted Swallow Goal Status [phone]([id number]): At least 1 percent but less than 20 percent impaired, limited or restricted   Blenda MountsCouture, Cherrise Occhipinti Laurice 04/27/2016,11:07 AM  Marchelle FolksAmanda L. Samson Fredericouture, KentuckyMA CCC/SLP Pager [phone]

## 2016-04-27 NOTE — H&P (Signed)
History and Physical    [name] ZOX:[id number] DOB: 12-Sep-1917 DOA: 04/26/2016  PCP: [name], MD  Patient coming from: Home.  Chief Complaint: Difficulty speaking and confusion.  History obtained from ER physician.  HPI: [name] is a 82 y.o. female with history of CAD status post stenting, permanent A. fib, hypertension and diastolic CHF was brought to the ER to patient's family found the patient was having difficulty speaking and confusion. Patient's symptoms started yesterday morning after waking up. Patient also had a fall. In the ER patient was found to have mild right upper and lower extremity weakness. CT of the head shows left thalamic stroke. On call neurologist was consulted and patient is being admitted for further stroke workup. On my exam patient is alert awake oriented. Mild weakness in the right upper and lower extremity. Denies any headache visual symptoms or difficulty swallowing.   ED Course: CT head shows possible left thalamic stroke.  Review of Systems: As per HPI, rest all negative.   Past Medical History:  Diagnosis Date  . Anxiety   . Arthritis    R Hip total Arthroplasty & L Hip Hemiarthroplasty  . CAD S/P percutaneous coronary angioplasty 2004   ZETA BMS 4.0 mm X 15 mm in RCA - 2004; CYPHER DES 2.65mm x 18 mm to LAD (with PTCA of jailed D2 with ostial 85%)& 2 in 2007  . Cataract    removed bil eyes  . Depression   . Diverticulosis   . Gastric outlet obstruction 2012   remote -- s/p Vagotomy & Pyeloroplasty  . GERD (gastroesophageal reflux disease)    TIA  . History of blood transfusion    With Hip Surgery  . Hyperlipidemia   . Hypertension    H/o Renovascular HTN, s/p R RA Stent in 06/2003  . Multiple allergies   . Osteoporosis    s/p L hip Fxr - Hemiarthroplasty  . PAF (paroxysmal atrial fibrillation) [facility]) April 2015   Newly diagnosed by PCP  . PUD (peptic ulcer disease)    Gastric Ulcer  . Renal artery stenosis,  native (HCC) 2003-2004   S/P R Renal A Stent 5 mm x 12 mm  . TIA (transient ischemic attack)     Past Surgical History:  Procedure Laterality Date  . CARDIAC CATHETERIZATION  01/17/2009   normal L main, normal LAD, normal L Cfx, RCA with patent stent (Dr. Erlene Quan)  . CATARACT EXTRACTION Bilateral   . COLONOSCOPY    . CORONARY ANGIOPLASTY WITH STENT PLACEMENT  2007   hx/notes 01/06/2015  . HIP ARTHROPLASTY  11/26/2011   Procedure: ARTHROPLASTY BIPOLAR HIP;  Surgeon: Javier Docker, MD;  Location: [facility] OR;  Service: Orthopedics;  Laterality: Left;  . JOINT REPLACEMENT    . NM MYOCAR PERF WALL MOTION  08/2007   persantine myoview - normal pattern of perfusion, EF 80%, low risk scan  . PERCUTANEOUS CORONARY STENT INTERVENTION (PCI-S)  06/24/2003   ACS Zeta BMS 4.0x69mm to RCA (Dr. Jonette Eva)  . PERCUTANEOUS CORONARY STENT INTERVENTION (PCI-S)  11/13/2005   Cypher DES 2.5x78mm to LAD; Cutting PTCA of Ostial D2 (jailed) (Dr. Jonette Eva)  . RENAL ARTERY STENT  07/22/2003   Cordis 5x59mm stent to right renal artery (Dr. Jonette Eva)  . REPLACEMENT TOTAL HIP W/  RESURFACING IMPLANTS Right 04/2002  . TONSILLECTOMY    . TRANSTHORACIC ECHOCARDIOGRAM  11/23/2013; 01/07/2015:   a) EF 55-60%, no regional WMA; MAC without MR;; b) normal LV size  and thickness. EF 50-55%. No RWMA, mild LA dilation with mod PR  . VAGOTOMY AND PYLOROPLASTY  01/13/04     reports that she has never smoked. She has never used smokeless tobacco. She reports that she does not drink alcohol or use drugs.  No Known Allergies  Family History  Problem Relation Age of Onset  . Hypertension Mother   . Stroke Mother   . Hypertension Father   . Stroke Father   . Uterine cancer Sister   . Heart disease Sister   . Diabetes Son   . Heart disease Brother   . Esophageal cancer Neg Hx   . Colon cancer Neg Hx   . Stomach cancer Neg Hx     Prior to Admission medications   Medication Sig Start Date End Date Taking? Authorizing  Provider  acetaminophen (TYLENOL) 500 MG tablet Take 1,000 mg by mouth every 6 (six) hours as needed for mild pain.   Yes Historical Provider, MD  ALPRAZolam Prudy Feeler) 0.5 MG tablet Take 0.5 mg by mouth at bedtime.  10/17/15  Yes Historical Provider, MD  calcium gluconate 500 MG tablet Take 500 mg by mouth 2 (two) times daily.    Yes Historical Provider, MD  Cholecalciferol (VITAMIN D-3 PO) Take 1,000 mg by mouth daily.    Yes Historical Provider, MD  clopidogrel (PLAVIX) 75 MG tablet Take 75 mg by mouth daily.   Yes Historical Provider, MD  docusate sodium (COLACE) 100 MG capsule Take 100 mg by mouth 2 (two) times daily.     Yes Historical Provider, MD  folic acid (FOLVITE) 1 MG tablet Take 1 mg by mouth daily.     Yes Historical Provider, MD  HYDROcodone-acetaminophen (NORCO/VICODIN) 5-325 MG tablet TAKE 1 TABLET BY MOUTH EVERY 6 HOURS AS NEEDED FOR PAIN  **MUST LAST 30 DAYS PER MD** 10/11/15  Yes Historical Provider, MD  mirtazapine (REMERON) 30 MG tablet Take 30 mg by mouth at bedtime.  11/25/15  Yes Historical Provider, MD  Multiple Vitamin (MULITIVITAMIN WITH MINERALS) TABS Take 1 tablet by mouth daily.   Yes Historical Provider, MD  pantoprazole (PROTONIX) 40 MG tablet Take 1 tablet (40 mg total) by mouth 2 (two) times daily. 02/15/15  Yes Amy S Esterwood, PA-C  temazepam (RESTORIL) 30 MG capsule Take 30 mg by mouth at bedtime.   Yes Historical Provider, MD    Physical Exam: Vitals:   04/26/16 2200 04/26/16 2215 04/26/16 2315 04/26/16 2317  BP: 170/83 160/77 160/66   Pulse: (!) 54 66 62   Resp: 19 18 20    Temp:    98.1 F (36.7 C)  TempSrc:    Oral  SpO2: 98% 99% 97%   Weight:      Height:          Constitutional: Moderately built and nourished. Vitals:   04/26/16 2200 04/26/16 2215 04/26/16 2315 04/26/16 2317  BP: 170/83 160/77 160/66   Pulse: (!) 54 66 62   Resp: 19 18 20    Temp:    98.1 F (36.7 C)  TempSrc:    Oral  SpO2: 98% 99% 97%   Weight:      Height:       Eyes:  Anicteric no pallor. ENMT: No discharge from the ears eyes nose or mouth. Neck: No neck rigidity no mass felt. No JVD appreciated. Respiratory: No rhonchi or crepitations. Cardiovascular: S1 and S2 heard no murmur appreciated. Abdomen: Soft nontender bowel sounds present. No guarding or rigidity. Musculoskeletal: No edema. No  joint effusion. Skin: No rash. Skin appears warm. Neurologic: Mild weakness of the right upper and lower extremity. Left upper and lower extremities 5 x 5. May be having mild right facial droop. Tongue is midline. PERRLA positive. Psychiatric: Alert awake oriented to time place and person. Appears normal.   Labs on Admission: I have personally reviewed following labs and imaging studies  CBC:  Recent Labs Lab 04/26/16 2018  WBC 10.0  NEUTROABS 7.4  HGB 14.1  HCT 43.4  MCV 98.2  PLT 165   Basic Metabolic Panel:  Recent Labs Lab 04/26/16 2018  NA 139  K 4.0  CL 105  CO2 23  GLUCOSE 140*  BUN 29*  CREATININE 1.10*  CALCIUM 10.3   GFR: Estimated Creatinine Clearance: 22.8 mL/min (by C-G formula based on SCr of 1.1 mg/dL (H)). Liver Function Tests:  Recent Labs Lab 04/26/16 2018  AST 22  ALT 17  ALKPHOS 78  BILITOT 0.3  PROT 7.2  ALBUMIN 3.7   No results for input(s): LIPASE, AMYLASE in the last 168 hours. No results for input(s): AMMONIA in the last 168 hours. Coagulation Profile: No results for input(s): INR, PROTIME in the last 168 hours. Cardiac Enzymes: No results for input(s): CKTOTAL, CKMB, CKMBINDEX, TROPONINI in the last 168 hours. BNP (last 3 results) No results for input(s): PROBNP in the last 8760 hours. HbA1C: No results for input(s): HGBA1C in the last 72 hours. CBG:  Recent Labs Lab 04/26/16 1958  GLUCAP 139*   Lipid Profile: No results for input(s): CHOL, HDL, LDLCALC, TRIG, CHOLHDL, LDLDIRECT in the last 72 hours. Thyroid Function Tests: No results for input(s): TSH, T4TOTAL, FREET4, T3FREE, THYROIDAB in the  last 72 hours. Anemia Panel: No results for input(s): VITAMINB12, FOLATE, FERRITIN, TIBC, IRON, RETICCTPCT in the last 72 hours. Urine analysis:    Component Value Date/Time   COLORURINE YELLOW 04/26/2016 2203   APPEARANCEUR HAZY (A) 04/26/2016 2203   LABSPEC 1.011 04/26/2016 2203   PHURINE 6.0 04/26/2016 2203   GLUCOSEU NEGATIVE 04/26/2016 2203   HGBUR TRACE (A) 04/26/2016 2203   BILIRUBINUR NEGATIVE 04/26/2016 2203   KETONESUR NEGATIVE 04/26/2016 2203   PROTEINUR NEGATIVE 04/26/2016 2203   UROBILINOGEN 0.2 01/06/2015 1630   NITRITE NEGATIVE 04/26/2016 2203   LEUKOCYTESUR LARGE (A) 04/26/2016 2203   Sepsis Labs: @LABRCNTIP (procalcitonin:4,lacticidven:4) )No results found for this or any previous visit (from the past 240 hour(s)).   Radiological Exams on Admission: Ct Head Wo Contrast  Result Date: 04/26/2016 CLINICAL DATA:  Became dizzy while sitting in a chair in the kitchen, fell and hit her head, abrasions to face, slight neck pain, found on floor at 0900 hours, slurred speech, history coronary artery disease post PTCA, PAF EXAM: CT HEAD WITHOUT CONTRAST CT CERVICAL SPINE WITHOUT CONTRAST TECHNIQUE: Multidetector CT imaging of the head and cervical spine was performed following the standard protocol without intravenous contrast. Multiplanar CT image reconstructions of the cervical spine were also generated. COMPARISON:  None FINDINGS: CT HEAD FINDINGS Brain: Generalized atrophy. Normal ventricular morphology. No midline shift or mass effect. Small vessel chronic ischemic changes of deep cerebral white matter. Artery low-attenuation at the anterior LEFT thalamus likely representing acute infarct. No intracranial hemorrhage, cortical infarct, mass lesion or extra-axial fluid collection. Vascular: Atherosclerotic calcification throughout the carotid siphons and at the vertebral arteries. Skull: Intact Sinuses/Orbits: Clear Other: N/A CT CERVICAL SPINE FINDINGS Alignment: Normal Skull base  and vertebrae: Skullbase intact. Vertebral body heights maintained. Multilevel facet degenerative changes. No fracture or bone destruction. Significant  degenerative changes and spur formation at the RIGHT C1-C2 articulation. Scattered multilevel neural foraminal narrowing bilaterally by spurs. Soft tissues and spinal canal: Prevertebral soft tissues normal thickness. Spinal canal patent. Disc levels: Multilevel disc space narrowing and minimal endplate spur formation. Upper chest: Emphysematous but clear Other: Nonspecific tiny BILATERAL thyroid nodules bilaterally. Carotid atherosclerotic calcification bilaterally. IMPRESSION: Atrophy with small vessel chronic ischemic changes of deep cerebral white matter. Poorly defined low-attenuation within the anterior LEFT thalamus most consistent with acute infarct. No acute intracranial hemorrhage or cortical infarction. Multilevel degenerative disc and facet disease changes of the cervical spine. No acute cervical spine abnormalities. Electronically Signed   By: Ulyses Southward M.D.   On: 04/26/2016 21:07   Ct Cervical Spine Wo Contrast  Result Date: 04/26/2016 CLINICAL DATA:  Became dizzy while sitting in a chair in the kitchen, fell and hit her head, abrasions to face, slight neck pain, found on floor at 0900 hours, slurred speech, history coronary artery disease post PTCA, PAF EXAM: CT HEAD WITHOUT CONTRAST CT CERVICAL SPINE WITHOUT CONTRAST TECHNIQUE: Multidetector CT imaging of the head and cervical spine was performed following the standard protocol without intravenous contrast. Multiplanar CT image reconstructions of the cervical spine were also generated. COMPARISON:  None FINDINGS: CT HEAD FINDINGS Brain: Generalized atrophy. Normal ventricular morphology. No midline shift or mass effect. Small vessel chronic ischemic changes of deep cerebral white matter. Artery low-attenuation at the anterior LEFT thalamus likely representing acute infarct. No intracranial  hemorrhage, cortical infarct, mass lesion or extra-axial fluid collection. Vascular: Atherosclerotic calcification throughout the carotid siphons and at the vertebral arteries. Skull: Intact Sinuses/Orbits: Clear Other: N/A CT CERVICAL SPINE FINDINGS Alignment: Normal Skull base and vertebrae: Skullbase intact. Vertebral body heights maintained. Multilevel facet degenerative changes. No fracture or bone destruction. Significant degenerative changes and spur formation at the RIGHT C1-C2 articulation. Scattered multilevel neural foraminal narrowing bilaterally by spurs. Soft tissues and spinal canal: Prevertebral soft tissues normal thickness. Spinal canal patent. Disc levels: Multilevel disc space narrowing and minimal endplate spur formation. Upper chest: Emphysematous but clear Other: Nonspecific tiny BILATERAL thyroid nodules bilaterally. Carotid atherosclerotic calcification bilaterally. IMPRESSION: Atrophy with small vessel chronic ischemic changes of deep cerebral white matter. Poorly defined low-attenuation within the anterior LEFT thalamus most consistent with acute infarct. No acute intracranial hemorrhage or cortical infarction. Multilevel degenerative disc and facet disease changes of the cervical spine. No acute cervical spine abnormalities. Electronically Signed   By: Ulyses Southward M.D.   On: 04/26/2016 21:07    EKG: Independently reviewed. A. fib rate controlled.  Assessment/Plan Active Problems:   Essential hypertension   CAD S/P percutaneous coronary angioplasty: BMS to RCA in 2004; DES to LAD with PTCA of D2 in 2007;  patent in 2010   Permanent atrial fibrillation (HCC)   Thrombotic stroke involving cerebral artery (HCC)   Stroke (cerebrum) (HCC)    1. Acute left thalamic stroke - discussed with Dr. Noel Christmas on-call neurologist. At this time patient is being placed on neurochecks and swallow evaluation. Will get MRI/MRA brain today, carotid Doppler. Patient is on Plavix. Check  lipid panel hemoglobin A1c. 2. Hypertension uncontrolled - patient has not been on any medications for hypertension prior to admission. Since patient has stroke will allow for permissive hypertension and closely follow blood pressure trends. When necessary IV hydralazine for systolic blood pressure more than 220 and diastolic more than 120. 3. Permanent A. Fib - rate controlled without any medications. Chads 2 vasc score is more than 2.  Patient is not on anticoagulation at patient's own wish. 4. CAD status post stenting - denies any chest pain. On Plavix. Intolerant to statins. 5. History of diastolic CHF - no acute issues at this time. 6. UA showing possible UTI - patient appears to be asymptomatic. Will check urine culture but I have not placed patient on antibiotic.  X-ray pelvis pending.   DVT prophylaxis: Lovenox. Code Status: Full code.  Family Communication: Discussed with patient.  Disposition Plan: Home.  Consults called: Neurology and physical therapy.  Admission status: Observation.    Eduard Clos MD [facility] Pager [phone].  If 7PM-7AM, please contact night-coverage [email or web address] Password [email or web address]  04/27/2016, 2:05 AM

## 2016-04-27 NOTE — Progress Notes (Signed)
Lengthy conversation with patient's daughter Alona BeneJoyce about anticoagulation.  Patient has had repeated falls and patient's daughter states she does not understand why patient is not on anticoagulation.  Patient still only oriented x 2.   Will look into patient's chart to see if any mention of anticoagulation is made in previous notes.

## 2016-04-27 NOTE — Progress Notes (Signed)
STROKE TEAM PROGRESS NOTE   HISTORY OF PRESENT ILLNESS (per record) [name] is an 82 y.o. female history of TIA, PAF, hypertension, hyperlipidemia and coronary artery disease presenting with confusion and slurred speech as well as history of falling at home. She's been on Plavix daily. CT scan of her head showed low density area involving left thalamus, indicative of probable acute ischemic infarction. No facial droop was described. NIH stroke score at the time of this evaluation was 4. She was LKW 9 AM on 04/26/2016.  Patient was not administered IV t-PA secondary to being beyond time under for treatment consideration. She was admitted for further evaluation and treatment.   SUBJECTIVE (INTERVAL HISTORY) No family is at the bedside.  Overall she feels her condition is stable. Patient states she stays at home and her daughter helps.    OBJECTIVE Temp:  [98.1 F (36.7 C)-98.9 F (37.2 C)] 98.9 F (37.2 C) (10/06 0926) Pulse Rate:  [40-114] 81 (10/06 0926) Resp:  [15-22] 16 (10/06 0926) BP: (106-197)/(63-97) 197/89 (10/06 0926) SpO2:  [93 %-99 %] 96 % (10/06 0655) Weight:  [60.3 kg (133 lb)] 60.3 kg (133 lb) (10/05 1953)  CBC:  Recent Labs Lab 04/26/16 2018 04/27/16 0421  WBC 10.0 11.9*  NEUTROABS 7.4  --   HGB 14.1 14.0  HCT 43.4 42.6  MCV 98.2 98.2  PLT 165 173    Basic Metabolic Panel:  Recent Labs Lab 04/26/16 2018 04/27/16 0507  NA 139 142  K 4.0 4.6  CL 105 103  CO2 23 25  GLUCOSE 140* 98  BUN 29* 28*  CREATININE 1.10* 1.03*  CALCIUM 10.3 10.5*    Lipid Panel:    Component Value Date/Time   CHOL 147 04/27/2016 0203   TRIG 136 04/27/2016 0203   HDL 45 04/27/2016 0203   CHOLHDL 3.3 04/27/2016 0203   VLDL 27 04/27/2016 0203   LDLCALC 75 04/27/2016 0203   HgbA1c:  Lab Results  Component Value Date   HGBA1C 6.0 (H) 01/06/2015   Urine Drug Screen: No results found for: LABOPIA, COCAINSCRNUR, LABBENZ, AMPHETMU, THCU, LABBARB    IMAGING  Ct  Head Wo Contrast Ct Cervical Spine Wo Contrast 04/26/2016 Atrophy with small vessel chronic ischemic changes of deep cerebral white matter. Poorly defined low-attenuation within the anterior LEFT thalamus most consistent with acute infarct. No acute intracranial hemorrhage or cortical infarction. Multilevel degenerative disc and facet disease changes of the cervical spine. No acute cervical spine abnormalities.   Dg Pelvis Portable 04/27/2016 No acute abnormality.   MRI HEAD 04/27/2016 Acute 9 x 13 mm LEFT thalamus infarct corresponding to CT abnormality. Old small LEFT cerebellar infarcts. Mild chronic small vessel ischemic disease.   MRA HEAD 04/27/2016 No emergent large vessel occlusion or severe stenosis. Slow flow versus occluded/ developmentally absent basilar tip with complete, robust circle of Willis.     PHYSICAL EXAM Pleasant  Frail elderly lady not in distress. . Afebrile. Head is nontraumatic. Neck is supple without bruit.    Cardiac exam no murmur or gallop. Lungs are clear to auscultation. Distal pulses are well felt. Neurological Exam ;  Awake  Alert oriented x 2. Diminished attention, registration and recall.. Normal speech and language.eye movements full without nystagmus.fundi were not visualized. Vision acuity and fields appear normal. Hearing is normal. Palatal movements are normal. Face symmetric. Tongue midline. Normal strength, tone, reflexes and coordination. Normal sensation. Gait deferred.  ASSESSMENT/PLAN [name] is a 82 y.o. female with history of TIA,  PAF, hypertension, hyperlipidemia and coronary artery disease presenting with slurred speech, confusion and recurrent falls. She did not receive IV t-PA due to being beyond time under for treatment consideration .   Stroke:   left thalamic infarct, given location probably secondary to small vessel disease source though has atrial fibrillation   MRI  L thalamic infarct  MRA  No significant large  vessel occlusion  Carotid Doppler  pending   2D Echo  pending   LDL 75  HgbA1c pending  Lovenox 30 mg sq daily for VTE prophylaxis     clopidogrel 75 mg daily prior to admission, now on clopidogrel 75 mg daily. (see below)  Ongoing aggressive stroke risk factor management  Therapy recommendations:  pending   Disposition:  pending   Atrial Fibrillation  Home anticoagulation:  none   Has been on anticoagulation in the past per pt  No proven role for plavix in the setting of atrial fibrillation, so will change to aspirin for now  Prefer anticoagulation, but given pts frail state, falls and unreliable history, recommend discussion with daughter prior to starting  Dr. Pearlean BrownieSethi discussed with DR. Ukleja   Hypertension  Permissive hypertension (OK if < 220/120) but gradually normalize in 5-7 days  Long-term BP goal normotensive  Hyperlipidemia  Home meds:  No statin  LDL 75, goal < 70  Per stroke guidelines, statin recommended  Patient intolerant to statins in the past. Given age, frailty, not worth it to risk myalgias given close to goal level  Hx diastolic CHF  Other Stroke Risk Factors  Advanced age  Hx TIA  Family hx stroke (mother, father)  Coronary artery disease s/p stents 2007, 2004  Other Active Problems  Possible URI  Hospital day # 0  Rhoderick MoodyBIBY,SHARON  [facility] See Amion for Pager information 04/27/2016 2:59 PM  I have personally examined this patient, reviewed notes, independently viewed imaging studies, participated in medical decision making and plan of care.ROS completed by me personally and pertinent positives fully documented  I have made any additions or clarifications directly to the above note. Agree with note above. Change plavix to aspirin and d/w daughter reason for not being on anticoagulation in past for atrial fibrillation Greater than 50% time during this 35 minute visit was spent on counselling and coordination of care  about stroke risk, prevention and treatment. Delia HeadyPramod Sethi, MD Medical Director [facility] Pager: [phone] 04/27/2016 5:28 PM  To contact Stroke Continuity provider, please refer to [email or web address]. After hours, contact General Neurology

## 2016-04-27 NOTE — ED Notes (Addendum)
Pt. Coming in after hitting her head with slurred speech as only deficit. Pt. MRI showed no large vessel occlusion. Pt. Hx of dementia. Pt. Has been using bedpan in ED. Pt. Failed stroke swallow screen. NIH of 1 for LOC questions. Daughter went home, but will be returning.

## 2016-04-28 ENCOUNTER — Inpatient Hospital Stay (HOSPITAL_COMMUNITY): Payer: Commercial Managed Care - HMO

## 2016-04-28 DIAGNOSIS — I2510 Atherosclerotic heart disease of native coronary artery without angina pectoris: Secondary | ICD-10-CM

## 2016-04-28 DIAGNOSIS — Z9861 Coronary angioplasty status: Secondary | ICD-10-CM

## 2016-04-28 DIAGNOSIS — W19XXXA Unspecified fall, initial encounter: Secondary | ICD-10-CM

## 2016-04-28 DIAGNOSIS — I6330 Cerebral infarction due to thrombosis of unspecified cerebral artery: Secondary | ICD-10-CM

## 2016-04-28 LAB — HEMOGLOBIN A1C
HEMOGLOBIN A1C: 6 % — AB (ref 4.8–5.6)
Mean Plasma Glucose: 126 mg/dL

## 2016-04-28 NOTE — Progress Notes (Signed)
VASCULAR LAB PRELIMINARY  PRELIMINARY  PRELIMINARY  PRELIMINARY  Carotid duplex completed.    Preliminary report:  1-39% ICA plaquing. Vertebral artery flow is antegrade.   Yaneli Keithley, RVT 04/28/2016, 4:04 PM

## 2016-04-28 NOTE — Evaluation (Signed)
Physical Therapy Evaluation Patient Details Name: [name] MRN: [id number] DOB: 07-08-1918 Today's Date: 04/28/2016   History of Present Illness  82 y.o. female with history of CAD status post stenting, permanent A. fib, hypertension and diastolic CHF was brought to the ER to patient's family found the patient was having difficulty speaking and confusion. Patient's symptoms started yesterday morning after waking up. Patient also had a fall. In the ER patient was found to have mild right upper and lower extremity weakness. CT of the head shows left thalamic stroke.  Clinical Impression  Pt's prior falls related to dizziness/lightheadedness per pt report.  Pt lightheaded upon sitting and again upon standing and during gait which did not immediately resolve after resting in recliner.  Post ex BP 177/88, HR 107. Pt at risk for falls.  Noted purplish bruising Rt lower anterior leg.  Will continue to follow patient while on this venue of care to progress mobility. Therapy will continue to follow to assist with discharge planning and follow up recommendations.    Follow Up Recommendations SNF;Supervision/Assistance - 24 hour    Equipment Recommendations  Rolling walker with 5" wheels;3in1 (PT)    Recommendations for Other Services       Precautions / Restrictions Precautions Precautions: Fall Precaution Comments: prior h/o multiple falls, last fall last week from dizziness Restrictions Weight Bearing Restrictions: No      Mobility  Bed Mobility Overal bed mobility: Needs Assistance Bed Mobility: Supine to Sit     Supine to sit: Mod assist     General bed mobility comments: mod assist  Transfers Overall transfer level: Needs assistance Equipment used: Rolling walker (2 wheeled) Transfers: Sit to/from [employer] Sit to Stand: Min assist Stand pivot transfers: Mod assist       General transfer comment: min cues for hand  placement  Ambulation/Gait Ambulation/Gait assistance: Mod assist Ambulation Distance (Feet): 20 Feet Assistive device: Rolling walker (2 wheeled) Gait Pattern/deviations: Antalgic;Decreased stance time - right;Decreased step length - right;Decreased step length - left;Trunk flexed     General Gait Details: leans over RW, recommend follow with recliner for safety. Pt dizzy during gait (lightheaded)  Stairs            Wheelchair Mobility    Modified Rankin (Stroke Patients Only) Modified Rankin (Stroke Patients Only) Pre-Morbid Rankin Score: No symptoms Modified Rankin: Moderately severe disability     Balance Overall balance assessment: Needs assistance Sitting-balance support: Bilateral upper extremity supported;Feet supported Sitting balance-Leahy Scale: Fair     Standing balance support: Bilateral upper extremity supported Standing balance-Leahy Scale: Poor                               Pertinent Vitals/Pain Pain Assessment: No/denies pain    Home Living Family/patient expects to be discharged to:: Skilled nursing facility Living Arrangements: Alone               Additional Comments: pt lives alone, dau can assist upon discharge, level unknown    Prior Function Level of Independence: Needs assistance;Independent with assistive device(s)   Gait / Transfers Assistance Needed: walker  ADL's / Homemaking Assistance Needed: assist with dressing and showering, dau does household chores        Hand Dominance   Dominant Hand: Right    Extremity/Trunk Assessment   Upper Extremity Assessment: Defer to OT evaluation           Lower Extremity Assessment:  Generalized weakness (4+/5 bil hip flexion, knee ext, ankle DF, fxnl deficits)      Cervical / Trunk Assessment: Kyphotic  Communication   Communication: No difficulties  Cognition Arousal/Alertness: Awake/alert Behavior During Therapy: WFL for tasks assessed/performed Overall  Cognitive Status: Within Functional Limits for tasks assessed Area of Impairment: Orientation;Memory;Safety/judgement;Problem solving Orientation Level: Time   Memory: Decreased short-term memory;Decreased recall of precautions   Safety/Judgement: Decreased awareness of deficits   Problem Solving: Slow processing General Comments: Pt's family noticing decreased memory, some confusion that is not baseline.    General Comments General comments (skin integrity, edema, etc.): post exercise BP 177/88, HR 107 bpm, lightheaded upon sitting and during gait    Exercises     Assessment/Plan    PT Assessment Patient needs continued PT services  PT Problem List Decreased strength;Decreased activity tolerance;Decreased balance;Decreased mobility;Decreased knowledge of use of DME          PT Treatment Interventions DME instruction;Gait training;Functional mobility training;Therapeutic activities;Therapeutic exercise;Balance training;Neuromuscular re-education;Patient/family education;Wheelchair mobility training    PT Goals (Current goals can be found in the Care Plan section)  Acute Rehab PT Goals Patient Stated Goal: not fall PT Goal Formulation: With patient Time For Goal Achievement: 05/11/16 Potential to Achieve Goals: Good    Frequency Min 3X/week   Barriers to discharge Decreased caregiver support unclear how much assist pt's dau can provide upon discharge    Co-evaluation               End of Session Equipment Utilized During Treatment: Gait belt Activity Tolerance: Patient tolerated treatment well Patient left: in bed;with call bell/phone within reach           Time: [id number] PT Time Calculation (min) (ACUTE ONLY): 27 min   Charges:   PT Evaluation $PT Eval Moderate Complexity: 1 Procedure     PT G CodesNestor Lewandowsky:       Taino Maertens M Creston Klas, PT [phone]  Batsheva Stevick 04/28/2016, 6:56 PM

## 2016-04-28 NOTE — Evaluation (Signed)
Occupational Therapy Evaluation Patient Details Name: Diane MaesLillie K Miano MRN: [id number] DOB: 04-Apr-1918 Today's Date: 04/28/2016    History of Present Illness 82 y.o. female with history of CAD status post stenting, permanent A. fib, hypertension and diastolic CHF was brought to the ER to patient's family found the patient was having difficulty speaking and confusion. Patient's symptoms started yesterday morning after waking up. Patient also had a fall. In the ER patient was found to have mild right upper and lower extremity weakness. CT of the head shows left thalamic stroke.   Clinical Impression   Pt admitted with the above diagnoses and presents with below problem list. Pt will benefit from continued acute OT to address the below listed deficits and maximize independence with basic ADLs prior to d/c to venue below. PTA pt was mostly mod I with ADLs, needing occasional help with LB ADLs and min guard for shower transfers. Pt is currently mod to max A with LB ADLs and functional transfers.      Follow Up Recommendations  SNF    Equipment Recommendations  Other (comment) (defer to next venue)    Recommendations for Other Services PT consult     Precautions / Restrictions Precautions Precautions: Fall Restrictions Weight Bearing Restrictions: No      Mobility Bed Mobility Overal bed mobility: Needs Assistance Bed Mobility: Supine to Sit     Supine to sit: Mod assist     General bed mobility comments: min A to powerup trunk and to bring hips fully EOB  Transfers Overall transfer level: Needs assistance Equipment used: Rolling walker (2 wheeled) Transfers: Sit to/from [employer];Stand Pivot Transfers Sit to Stand: Mod assist Stand pivot transfers: Mod assist       General transfer comment: from EOB>BSC>recliner. Assist for powerup and to steady balance.    Balance Overall balance assessment: Needs assistance Sitting-balance support: No upper extremity  supported Sitting balance-Leahy Scale: Fair     Standing balance support: Bilateral upper extremity supported Standing balance-Leahy Scale: Poor                              ADL Overall ADL's : Needs assistance/impaired Eating/Feeding: Set up;Sitting   Grooming: Set up;Sitting   Upper Body Bathing: Set up;Sitting   Lower Body Bathing: Maximal assistance;Sit to/from stand   Upper Body Dressing : Set up;Sitting   Lower Body Dressing: Maximal assistance;Sit to/from stand   Toilet Transfer: Moderate assistance;Stand-pivot;BSC;RW   Toileting- Clothing Manipulation and Hygiene: Moderate assistance;Sit to/from stand   Tub/ Engineer, structuralhower Transfer: Moderate assistance;Stand-pivot;3 in 1;Rolling walker   Functional mobility during ADLs: Moderate assistance;Rolling walker (pivotal steps) General ADL Comments: Pt completed bed mobility and SPT from EOB>BSC>recliner. Assist for balance.      Vision     Perception     Praxis      Pertinent Vitals/Pain Pain Assessment: No/denies pain     Hand Dominance Right   Extremity/Trunk Assessment Upper Extremity Assessment Upper Extremity Assessment: Generalized weakness   Lower Extremity Assessment Lower Extremity Assessment: Defer to PT evaluation       Communication Communication Communication: Other (comment) (mildly slurred speech noted)   Cognition Arousal/Alertness: Awake/alert Behavior During Therapy: WFL for tasks assessed/performed Overall Cognitive Status: Impaired/Different from baseline Area of Impairment: Orientation;Memory;Safety/judgement;Problem solving Orientation Level: Time   Memory: Decreased short-term memory;Decreased recall of precautions   Safety/Judgement: Decreased awareness of deficits   Problem Solving: Slow processing General Comments: Pt's family noticing decreased memory,  some confusion that is not baseline.   General Comments       Exercises       Shoulder Instructions       Home Living Family/patient expects to be discharged to:: Skilled nursing facility Living Arrangements: Alone                               Additional Comments: daughter lives across the street      Prior Functioning/Environment Level of Independence: Needs assistance;Independent with assistive device(s)  Gait / Transfers Assistance Needed: rw ADL's / Homemaking Assistance Needed: min A with LB ADLs (daughter washes feet and gets pants started, min guard for shower transfers)            OT Problem List: Impaired balance (sitting and/or standing);Decreased knowledge of use of DME or AE;Decreased knowledge of precautions;Decreased cognition   OT Treatment/Interventions: Self-care/ADL training;Therapeutic exercise;DME and/or AE instruction;Therapeutic activities;Patient/family education;Balance training;Cognitive remediation/compensation    OT Goals(Current goals can be found in the care plan section) Acute Rehab OT Goals Patient Stated Goal: not stated OT Goal Formulation: With patient/family Time For Goal Achievement: 05/12/16 Potential to Achieve Goals: Good ADL Goals Pt Will Perform Lower Body Bathing: with min assist;sit to/from stand Pt Will Perform Lower Body Dressing: with min assist;sit to/from stand Pt Will Transfer to Toilet: with min assist;ambulating;bedside commode Pt Will Perform Toileting - Clothing Manipulation and hygiene: with min assist;sit to/from stand Pt Will Perform Tub/Shower Transfer: with min assist;ambulating;3 in 1;rolling walker Additional ADL Goal #1: Pt will complete bed mobility at min guard level to prepare for OOB ADLs.   OT Frequency: Min 2X/week   Barriers to D/C:            Co-evaluation              End of Session Equipment Utilized During Treatment: Gait belt;Rolling walker  Activity Tolerance: Patient tolerated treatment well Patient left: in chair;with call bell/phone within reach;with chair alarm set;with  family/visitor present   Time: 1500-1520 OT Time Calculation (min): 20 min Charges:  OT General Charges $OT Visit: 1 Procedure OT Evaluation $OT Eval Low Complexity: 1 Procedure G-Codes:    Pilar Grammes May 06, 2016, 4:32 PM

## 2016-04-28 NOTE — Progress Notes (Signed)
PROGRESS NOTE    [name]  ZOX:[id number] DOB: Apr 21, 1918 DOA: 04/26/2016 PCP: [name], WILLIAM, MD   Brief Narrative:   Diane MaesLillie K Odom is a 82 y.o. female with history of CAD status post stenting, permanent A. fib, hypertension and diastolic CHF was brought to the ER to patient's family found the patient was having difficulty speaking and confusion. Patient's symptoms started yesterday morning after waking up. Patient also had a fall. In the ER patient was found to have mild right upper and lower extremity weakness. CT of the head shows left thalamic stroke. On call neurologist was consulted and patient is being admitted for further stroke workup. On my exam patient is alert awake oriented. Mild weakness in the right upper and lower extremity. Denies any headache visual symptoms or difficulty swallowing.  PT/OT to evaluated patient to assist with placement.   Assessment & Plan:   Active Problems:   Essential hypertension   CAD S/P percutaneous coronary angioplasty: BMS to RCA in 2004; DES to LAD with PTCA of D2 in 2007;  patent in 2010   Permanent atrial fibrillation (HCC)   Thrombotic stroke involving cerebral artery (HCC)   Stroke (cerebrum) (HCC)   Acute CVA (cerebrovascular accident) (HCC)   Acute left thalamic stroke  - MRI/MRA brain results: Acute 9 x 13 mm LEFT thalamus infarct corresponding to CT Abnormality. Old small LEFT cerebellar infarcts. Mild chronic small vessel ischemic disease. MRA HEAD: No emergent large vessel occlusion or severe stenosis. Slow flow versus occluded/ developmentally absent basilar tip with complete, robust circle of Willis. - carotid Doppler within normal limits - stopped plavix yesterday - aspirin daily  Hypertension uncontrolled  - patient has not been on any medications for hypertension prior to admission - allow for permissive hypertension and closely follow blood pressure trends - IV hydralazine for systolic blood pressure more than  220 and diastolic more than 120  Permanent A. Fib  - rate controlled without any medications - Chads 2 vasc score is more than 2 - Patient is not on anticoagulation which after discussing at length with patient's daughter and son aswell as reviewing patients records from her PCP she is not on anticoagulation due to fall risk - discussed risk and benefit of starting anticoagulation but patient's daughter and son agree that patient is took high risk of falling to start anticoagulation  CAD status post stenting  - denies any chest pain - previously on plavix (d/c'ed yesterday) - Intolerant to statins.  History of diastolic CHF  - no acute issues at this time.  UA showing possible UTI  - patient appears to be asymptomatic - Will check urine culture and place on antibiotic accordingly   DVT prophylaxis: Lovenox Code Status: Full code Family Communication: Talked to patients son, nephew and daughter about plan and disposition Disposition Plan: Pending evaluation by PT/OT will likely need SNF as patient is max assist   Consultants:   Neurology  PT  OT  Procedures:   none  Antimicrobials:   none    Subjective: Patient awake and alert.  She is feeling well and ate a hearty breakfast.  Patient's nephew and son are visiting and on second evaluation patient's daughter is bedside.  Staff voice that patient is a max assist to even get to the bedside commode.  Patient verbal but per patient's daughter is "not as sharp as she was before the stroke".  Objective: Vitals:   04/27/16 2200 04/28/16 0107 04/28/16 0700 04/28/16 0950  BP: (!) 142/83 Marland Kitchen(!)  143/57 (!) 141/70 (!) 162/63  Pulse: 84 71 73 (!) 55  Resp: 18 19 18 18   Temp:  97.6 F (36.4 C) 97.9 F (36.6 C) 97.9 F (36.6 C)  TempSrc:  Axillary Oral Oral  SpO2: 97% 98% 98% 99%  Weight:      Height:        Intake/Output Summary (Last 24 hours) at 04/28/16 1350 Last data filed at 04/28/16 0355  Gross per 24 hour  Intake            1197.5 ml  Output                0 ml  Net           1197.5 ml   Filed Weights   04/26/16 1953  Weight: 60.3 kg (133 lb)    Examination:  General exam: Appears calm and comfortable  Respiratory system: Clear to auscultation. Respiratory effort normal. Cardiovascular system: S1 & S2 heard, RRR. No JVD, murmurs, rubs, gallops or clicks. No pedal edema. Gastrointestinal system: Abdomen is nondistended, soft and nontender. No organomegaly or masses felt. Normal bowel sounds heard. Central nervous system: Alert and oriented. No focal neurological deficits. Extremities: slight weakness of right UE and LE.  CN II-XII grossly intact with no deficits appreciated. Skin: No rashes, lesions or ulcers Psychiatry: Oriented to person and place. Mood & affect appropriate. Poor insight    Data Reviewed: I have personally reviewed following labs and imaging studies  CBC:  Recent Labs Lab 04/26/16 2018 04/27/16 0421  WBC 10.0 11.9*  NEUTROABS 7.4  --   HGB 14.1 14.0  HCT 43.4 42.6  MCV 98.2 98.2  PLT 165 173   Basic Metabolic Panel:  Recent Labs Lab 04/26/16 2018 04/27/16 0507  NA 139 142  K 4.0 4.6  CL 105 103  CO2 23 25  GLUCOSE 140* 98  BUN 29* 28*  CREATININE 1.10* 1.03*  CALCIUM 10.3 10.5*   GFR: Estimated Creatinine Clearance: 24.4 mL/min (by C-G formula based on SCr of 1.03 mg/dL (H)). Liver Function Tests:  Recent Labs Lab 04/26/16 2018 04/27/16 0507  AST 22 22  ALT 17 16  ALKPHOS 78 73  BILITOT 0.3 0.4  PROT 7.2 7.4  ALBUMIN 3.7 3.6   No results for input(s): LIPASE, AMYLASE in the last 168 hours. No results for input(s): AMMONIA in the last 168 hours. Coagulation Profile: No results for input(s): INR, PROTIME in the last 168 hours. Cardiac Enzymes: No results for input(s): CKTOTAL, CKMB, CKMBINDEX, TROPONINI in the last 168 hours. BNP (last 3 results) No results for input(s): PROBNP in the last 8760 hours. HbA1C:  Recent Labs   04/27/16 0203  HGBA1C 6.0*   CBG:  Recent Labs Lab 04/26/16 1958  GLUCAP 139*   Lipid Profile:  Recent Labs  04/27/16 0203  CHOL 147  HDL 45  LDLCALC 75  TRIG 136  CHOLHDL 3.3   Thyroid Function Tests: No results for input(s): TSH, T4TOTAL, FREET4, T3FREE, THYROIDAB in the last 72 hours. Anemia Panel: No results for input(s): VITAMINB12, FOLATE, FERRITIN, TIBC, IRON, RETICCTPCT in the last 72 hours. Sepsis Labs: No results for input(s): PROCALCITON, LATICACIDVEN in the last 168 hours.  Recent Results (from the past 240 hour(s))  Culture, Urine     Status: Abnormal (Preliminary result)   Collection Time: 04/26/16 10:03 PM  Result Value Ref Range Status   Specimen Description URINE, RANDOM  Final   Special Requests NONE  Final   Culture >=  100,000 COLONIES/mL CITROBACTER KOSERI (A)  Final   Report Status PENDING  Incomplete         Radiology Studies: Ct Head Wo Contrast  Result Date: 04/26/2016 CLINICAL DATA:  Became dizzy while sitting in a chair in the kitchen, fell and hit her head, abrasions to face, slight neck pain, found on floor at 0900 hours, slurred speech, history coronary artery disease post PTCA, PAF EXAM: CT HEAD WITHOUT CONTRAST CT CERVICAL SPINE WITHOUT CONTRAST TECHNIQUE: Multidetector CT imaging of the head and cervical spine was performed following the standard protocol without intravenous contrast. Multiplanar CT image reconstructions of the cervical spine were also generated. COMPARISON:  None FINDINGS: CT HEAD FINDINGS Brain: Generalized atrophy. Normal ventricular morphology. No midline shift or mass effect. Small vessel chronic ischemic changes of deep cerebral white matter. Artery low-attenuation at the anterior LEFT thalamus likely representing acute infarct. No intracranial hemorrhage, cortical infarct, mass lesion or extra-axial fluid collection. Vascular: Atherosclerotic calcification throughout the carotid siphons and at the vertebral arteries.  Skull: Intact Sinuses/Orbits: Clear Other: N/A CT CERVICAL SPINE FINDINGS Alignment: Normal Skull base and vertebrae: Skullbase intact. Vertebral body heights maintained. Multilevel facet degenerative changes. No fracture or bone destruction. Significant degenerative changes and spur formation at the RIGHT C1-C2 articulation. Scattered multilevel neural foraminal narrowing bilaterally by spurs. Soft tissues and spinal canal: Prevertebral soft tissues normal thickness. Spinal canal patent. Disc levels: Multilevel disc space narrowing and minimal endplate spur formation. Upper chest: Emphysematous but clear Other: Nonspecific tiny BILATERAL thyroid nodules bilaterally. Carotid atherosclerotic calcification bilaterally. IMPRESSION: Atrophy with small vessel chronic ischemic changes of deep cerebral white matter. Poorly defined low-attenuation within the anterior LEFT thalamus most consistent with acute infarct. No acute intracranial hemorrhage or cortical infarction. Multilevel degenerative disc and facet disease changes of the cervical spine. No acute cervical spine abnormalities. Electronically Signed   By: Ulyses Southward M.D.   On: 04/26/2016 21:07   Ct Cervical Spine Wo Contrast  Result Date: 04/26/2016 CLINICAL DATA:  Became dizzy while sitting in a chair in the kitchen, fell and hit her head, abrasions to face, slight neck pain, found on floor at 0900 hours, slurred speech, history coronary artery disease post PTCA, PAF EXAM: CT HEAD WITHOUT CONTRAST CT CERVICAL SPINE WITHOUT CONTRAST TECHNIQUE: Multidetector CT imaging of the head and cervical spine was performed following the standard protocol without intravenous contrast. Multiplanar CT image reconstructions of the cervical spine were also generated. COMPARISON:  None FINDINGS: CT HEAD FINDINGS Brain: Generalized atrophy. Normal ventricular morphology. No midline shift or mass effect. Small vessel chronic ischemic changes of deep cerebral white matter. Artery  low-attenuation at the anterior LEFT thalamus likely representing acute infarct. No intracranial hemorrhage, cortical infarct, mass lesion or extra-axial fluid collection. Vascular: Atherosclerotic calcification throughout the carotid siphons and at the vertebral arteries. Skull: Intact Sinuses/Orbits: Clear Other: N/A CT CERVICAL SPINE FINDINGS Alignment: Normal Skull base and vertebrae: Skullbase intact. Vertebral body heights maintained. Multilevel facet degenerative changes. No fracture or bone destruction. Significant degenerative changes and spur formation at the RIGHT C1-C2 articulation. Scattered multilevel neural foraminal narrowing bilaterally by spurs. Soft tissues and spinal canal: Prevertebral soft tissues normal thickness. Spinal canal patent. Disc levels: Multilevel disc space narrowing and minimal endplate spur formation. Upper chest: Emphysematous but clear Other: Nonspecific tiny BILATERAL thyroid nodules bilaterally. Carotid atherosclerotic calcification bilaterally. IMPRESSION: Atrophy with small vessel chronic ischemic changes of deep cerebral white matter. Poorly defined low-attenuation within the anterior LEFT thalamus most consistent with acute infarct. No acute  intracranial hemorrhage or cortical infarction. Multilevel degenerative disc and facet disease changes of the cervical spine. No acute cervical spine abnormalities. Electronically Signed   By: Ulyses Southward M.D.   On: 04/26/2016 21:07   Mr Brain Wo Contrast  Result Date: 04/27/2016 CLINICAL DATA:  Slurred speech and fall at home. Followup probable LEFT thalamus infarct. History of hypertension, hyperlipidemia. EXAM: MRI HEAD WITHOUT CONTRAST MRA HEAD WITHOUT CONTRAST TECHNIQUE: Multiplanar, multiecho pulse sequences of the brain and surrounding structures were obtained without intravenous contrast. Angiographic images of the head were obtained using MRA technique without contrast. COMPARISON:  CT HEAD April 26, 2016 and MRI of the  brain July 29, 2007 FINDINGS: MRI HEAD FINDINGS BRAIN: 9 x 13 mm ovoid focus of reduced diffusion mesial LEFT thalamus with corresponding low ADC values and bright FLAIR T2 hyperintense signal. No susceptibility artifact to suggest hemorrhage. The ventricles and sulci are normal for patient's age. No suspicious parenchymal signal, masses or mass effect. Patchy supratentorial white matter FLAIR T2 hyperintensities. Old small LEFT cerebellar infarcts. No abnormal extra-axial fluid collections. No extra-axial masses though, contrast enhanced sequences would be more sensitive. VASCULAR: Normal major intracranial vascular flow voids present at skull base. SKULL AND UPPER CERVICAL SPINE: No abnormal sellar expansion. No suspicious calvarial bone marrow signal. Craniocervical junction maintained. SINUSES/ORBITS: Minimal paranasal sinus mucosal thickening. Mild RIGHT mastoid effusion. Status post bilateral ocular lens implants. The included ocular globes and orbital contents are non-suspicious. OTHER: Patient is edentulous. MRA HEAD FINDINGS ANTERIOR CIRCULATION: Normal flow related enhancement of the included cervical, petrous, cavernous and supraclinoid internal carotid arteries. Patent anterior communicating artery. Normal flow related enhancement of the anterior and middle cerebral arteries, including distal segments. No large vessel occlusion, high-grade stenosis, abnormal luminal irregularity, aneurysm. POSTERIOR CIRCULATION: LEFT vertebral artery is dominant. Proximal basilar artery is patent with normal flow related enhancement. Poor visualization of basilar tip. Normal flow related enhancement of the posterior cerebral arteries. Robust bilateral posterior communicating arteries present, possible fetal origin. No large vessel occlusion, high-grade stenosis, abnormal luminal irregularity, aneurysm. IMPRESSION: MRI HEAD: Acute 9 x 13 mm LEFT thalamus infarct corresponding to CT abnormality. Old small LEFT cerebellar  infarcts. Mild chronic small vessel ischemic disease. MRA HEAD: No emergent large vessel occlusion or severe stenosis. Slow flow versus occluded/ developmentally absent basilar tip with complete, robust circle of Willis. Electronically Signed   By: Awilda Metro M.D.   On: 04/27/2016 06:21   Dg Pelvis Portable  Result Date: 04/27/2016 CLINICAL DATA:  Fall. EXAM: PORTABLE PELVIS 1-2 VIEWS COMPARISON:  05/09/2011 . FINDINGS: Degenerative changes lumbar spine. Bilateral hip replacements. Diffuse osteopenia. No acute bony abnormality. If symptoms persist full pelvic series can be obtained. IMPRESSION: No acute abnormality. Electronically Signed   By: Maisie Fus  Register   On: 04/27/2016 07:34   Mr Maxine Glenn Head/brain Wo Cm  Result Date: 04/27/2016 CLINICAL DATA:  Slurred speech and fall at home. Followup probable LEFT thalamus infarct. History of hypertension, hyperlipidemia. EXAM: MRI HEAD WITHOUT CONTRAST MRA HEAD WITHOUT CONTRAST TECHNIQUE: Multiplanar, multiecho pulse sequences of the brain and surrounding structures were obtained without intravenous contrast. Angiographic images of the head were obtained using MRA technique without contrast. COMPARISON:  CT HEAD April 26, 2016 and MRI of the brain July 29, 2007 FINDINGS: MRI HEAD FINDINGS BRAIN: 9 x 13 mm ovoid focus of reduced diffusion mesial LEFT thalamus with corresponding low ADC values and bright FLAIR T2 hyperintense signal. No susceptibility artifact to suggest hemorrhage. The ventricles and sulci are normal for  patient's age. No suspicious parenchymal signal, masses or mass effect. Patchy supratentorial white matter FLAIR T2 hyperintensities. Old small LEFT cerebellar infarcts. No abnormal extra-axial fluid collections. No extra-axial masses though, contrast enhanced sequences would be more sensitive. VASCULAR: Normal major intracranial vascular flow voids present at skull base. SKULL AND UPPER CERVICAL SPINE: No abnormal sellar expansion. No  suspicious calvarial bone marrow signal. Craniocervical junction maintained. SINUSES/ORBITS: Minimal paranasal sinus mucosal thickening. Mild RIGHT mastoid effusion. Status post bilateral ocular lens implants. The included ocular globes and orbital contents are non-suspicious. OTHER: Patient is edentulous. MRA HEAD FINDINGS ANTERIOR CIRCULATION: Normal flow related enhancement of the included cervical, petrous, cavernous and supraclinoid internal carotid arteries. Patent anterior communicating artery. Normal flow related enhancement of the anterior and middle cerebral arteries, including distal segments. No large vessel occlusion, high-grade stenosis, abnormal luminal irregularity, aneurysm. POSTERIOR CIRCULATION: LEFT vertebral artery is dominant. Proximal basilar artery is patent with normal flow related enhancement. Poor visualization of basilar tip. Normal flow related enhancement of the posterior cerebral arteries. Robust bilateral posterior communicating arteries present, possible fetal origin. No large vessel occlusion, high-grade stenosis, abnormal luminal irregularity, aneurysm. IMPRESSION: MRI HEAD: Acute 9 x 13 mm LEFT thalamus infarct corresponding to CT abnormality. Old small LEFT cerebellar infarcts. Mild chronic small vessel ischemic disease. MRA HEAD: No emergent large vessel occlusion or severe stenosis. Slow flow versus occluded/ developmentally absent basilar tip with complete, robust circle of Willis. Electronically Signed   By: Awilda Metro M.D.   On: 04/27/2016 06:21        Scheduled Meds: . ALPRAZolam  0.5 mg Oral QHS  . aspirin EC  325 mg Oral Daily   Or  . aspirin  300 mg Rectal Daily  . calcium citrate  100 mg of elemental calcium Oral Daily  . docusate sodium  100 mg Oral BID  . enoxaparin (LOVENOX) injection  30 mg Subcutaneous Q24H  . folic acid  1 mg Oral Daily  . mirtazapine  30 mg Oral QHS  . multivitamin with minerals  1 tablet Oral Daily  . pantoprazole  40 mg  Oral BID  . temazepam  30 mg Oral QHS   Continuous Infusions: . sodium chloride 50 mL/hr at 04/28/16 0454     LOS: 1 day    Time spent: 40 minutes    Bennett Scrape, MD [facility] Pager [phone]  If 7PM-7AM, please contact night-coverage [email or web address] Password [email or web address] 04/28/2016, 1:50 PM

## 2016-04-28 NOTE — Progress Notes (Signed)
STROKE TEAM PROGRESS NOTE   HISTORY OF PRESENT ILLNESS (per record) [name] is an 82 y.o. female history of TIA, PAF, hypertension, hyperlipidemia and coronary artery disease presenting with confusion and slurred speech as well as history of falling at home. She's been on Plavix daily. CT scan of her head showed low density area involving left thalamus, indicative of probable acute ischemic infarction. No facial droop was described. NIH stroke score at the time of this evaluation was 4. She was LKW 9 AM on 04/26/2016.  Patient was not administered IV t-PA secondary to being beyond time under for treatment consideration. She was admitted for further evaluation and treatment.   SUBJECTIVE (INTERVAL HISTORY) Her son is at the bedside.  Overall she feels her condition is stable. Patient states she stays at home and her daughter helps.    OBJECTIVE Temp:  [97.6 F (36.4 C)-98.6 F (37 C)] 97.9 F (36.6 C) (10/07 0950) Pulse Rate:  [55-87] 55 (10/07 0950) Cardiac Rhythm: Atrial fibrillation (10/07 0827) Resp:  [18-19] 18 (10/07 0950) BP: (138-164)/(50-83) 162/63 (10/07 0950) SpO2:  [97 %-100 %] 99 % (10/07 0950)  CBC:   Recent Labs Lab 04/26/16 2018 04/27/16 0421  WBC 10.0 11.9*  NEUTROABS 7.4  --   HGB 14.1 14.0  HCT 43.4 42.6  MCV 98.2 98.2  PLT 165 173    Basic Metabolic Panel:   Recent Labs Lab 04/26/16 2018 04/27/16 0507  NA 139 142  K 4.0 4.6  CL 105 103  CO2 23 25  GLUCOSE 140* 98  BUN 29* 28*  CREATININE 1.10* 1.03*  CALCIUM 10.3 10.5*    Lipid Panel:     Component Value Date/Time   CHOL 147 04/27/2016 0203   TRIG 136 04/27/2016 0203   HDL 45 04/27/2016 0203   CHOLHDL 3.3 04/27/2016 0203   VLDL 27 04/27/2016 0203   LDLCALC 75 04/27/2016 0203   HgbA1c:  Lab Results  Component Value Date   HGBA1C 6.0 (H) 04/27/2016   Urine Drug Screen: No results found for: LABOPIA, COCAINSCRNUR, LABBENZ, AMPHETMU, THCU, LABBARB    IMAGING  Ct Head Wo  Contrast Ct Cervical Spine Wo Contrast 04/26/2016 Atrophy with small vessel chronic ischemic changes of deep cerebral white matter. Poorly defined low-attenuation within the anterior LEFT thalamus most consistent with acute infarct. No acute intracranial hemorrhage or cortical infarction. Multilevel degenerative disc and facet disease changes of the cervical spine. No acute cervical spine abnormalities.   Dg Pelvis Portable 04/27/2016 No acute abnormality.   MRI HEAD 04/27/2016 Acute 9 x 13 mm LEFT thalamus infarct corresponding to CT abnormality. Old small LEFT cerebellar infarcts. Mild chronic small vessel ischemic disease.   MRA HEAD 04/27/2016 No emergent large vessel occlusion or severe stenosis. Slow flow versus occluded/ developmentally absent basilar tip with complete, robust circle of Willis.     PHYSICAL EXAM Pleasant  Frail elderly lady not in distress. . Afebrile. Head is nontraumatic. Neck is supple without bruit.    Cardiac exam no murmur or gallop. Lungs are clear to auscultation. Distal pulses are well felt. Neurological Exam ;  Awake  Alert oriented x 2. Diminished attention, registration and recall.. Normal speech and language.eye movements full without nystagmus.fundi were not visualized. Vision acuity and fields appear normal. Hearing is normal. Palatal movements are normal. Face symmetric. Tongue midline. Normal strength, tone, reflexes and coordination. Normal sensation. Gait deferred.  ASSESSMENT/PLAN [name] is a 82 y.o. female with history of TIA, PAF, hypertension, hyperlipidemia  and coronary artery disease presenting with slurred speech, confusion and recurrent falls. She did not receive IV t-PA due to being beyond time under for treatment consideration .   Stroke:   left thalamic infarct, given location probably secondary to small vessel disease source though has atrial fibrillation   MRI  L thalamic infarct  MRA  No significant large vessel  occlusion  Carotid Doppler  pending   2D Echo  pending   LDL 75  HgbA1c pending  Lovenox 30 mg sq daily for VTE prophylaxis DIET DYS 3 Room service appropriate? Yes; Fluid consistency: Nectar Thick  clopidogrel 75 mg daily prior to admission, now on clopidogrel 75 mg daily. (see below)  Ongoing aggressive stroke risk factor management  Therapy recommendations:  pending   Disposition:  pending   Atrial Fibrillation  Home anticoagulation:  none   Has been on anticoagulation in the past per pt  No proven role for plavix in the setting of atrial fibrillation, so will change to aspirin for now  Prefer anticoagulation, but given pts frail state, falls and unreliable history, recommend discussion with daughter prior to starting  Dr. Pearlean BrownieSethi discussed with DR. Ukleja   Hypertension  Permissive hypertension (OK if < 220/120) but gradually normalize in 5-7 days  Long-term BP goal normotensive  Hyperlipidemia  Home meds:  No statin  LDL 75, goal < 70  Per stroke guidelines, statin recommended  Patient intolerant to statins in the past. Given age, frailty, not worth it to risk myalgias given close to goal level  Hx diastolic CHF  Other Stroke Risk Factors  Advanced age  Hx TIA  Family hx stroke (mother, father)  Coronary artery disease s/p stents 2007, 2004  Other Active Problems  Possible URI  Hospital day # 1     I have personally examined this patient, reviewed notes, independently viewed imaging studies, participated in medical decision making and plan of care.ROS completed by me personally and pertinent positives fully documented  I have made any additions or clarifications directly to the above note. . Continue aspirin and d/w son reason for not being on anticoagulation in past for atrial fibrillation Greater than 50% time during this 25 minute visit was spent on counselling and coordination of care about stroke risk, prevention and treatment. Delia HeadyPramod  Sethi, MD Medical Director [facility] Pager: [phone] 04/28/2016 1:20 PM  To contact Stroke Continuity provider, please refer to [email or web address]. After hours, contact General Neurology

## 2016-04-29 LAB — URINE CULTURE

## 2016-04-29 LAB — VAS US CAROTID
LCCADDIAS: 10 cm/s
LCCAPDIAS: 10 cm/s
LCCAPSYS: 72 cm/s
LEFT VERTEBRAL DIAS: -7 cm/s
Left CCA dist sys: 77 cm/s
Left ICA dist dias: -22 cm/s
Left ICA dist sys: -113 cm/s
Left ICA prox dias: -16 cm/s
Left ICA prox sys: -74 cm/s
RCCADSYS: -62 cm/s
RCCAPSYS: 72 cm/s
RIGHT ECA DIAS: -4 cm/s
RIGHT VERTEBRAL DIAS: -6 cm/s
Right CCA prox dias: 11 cm/s

## 2016-04-29 MED ORDER — CEPHALEXIN 500 MG PO CAPS
500.0000 mg | ORAL_CAPSULE | Freq: Two times a day (BID) | ORAL | Status: DC
  Administered 2016-04-29 – 2016-04-30 (×3): 500 mg via ORAL
  Filled 2016-04-29 (×3): qty 1

## 2016-04-29 NOTE — [place] FL2 (Signed)
[place] MEDICAID FL2 LEVEL OF CARE SCREENING TOOL     IDENTIFICATION  Patient Name: [name] Birthdate: 03-27-1918 Sex: female Admission Date (Current Location): 04/26/2016  [facility] and [place] Number:  Producer, television/film/video and Address:  [facility], [address] [place], [place] [address]      Provider Number: [id number]  Attending Physician Name and Address:  Maye Hides, MD  Relative Name and Phone Number:       Current Level of Care: Hospital Recommended Level of Care: Skilled Nursing Facility Prior Approval Number: [id number]  Date Approved/Denied: 04/29/16 PASRR Number:    Discharge Plan: SNF    Current Diagnoses: Patient Active Problem List   Diagnosis Date Noted  . Stroke (cerebrum) (HCC) 04/27/2016  . Acute CVA (cerebrovascular accident) (HCC) 04/27/2016  . Thrombotic stroke involving cerebral artery (HCC) 04/26/2016  . Rash of back 05/21/2015  . Chronic diastolic heart failure (HCC) 01/07/2015  . Chronic kidney disease stage III 01/07/2015  . Hypercalcemia 01/07/2015  . Dizziness 11/17/2014  . Permanent atrial fibrillation (HCC) 11/19/2013  . Exertional dyspnea 11/19/2013  . Closed left hip fracture (HCC) 11/25/2011  . Fall 11/25/2011  . Gastric outlet obstruction 05/04/2011  . NSAID-induced gastric ulcer 05/04/2011  . Nausea with vomiting 04/25/2011  . Loss of weight 04/25/2011  . Gastric ulcer, acute 04/25/2011  . History of vagotomy 04/25/2011  . GERD (gastroesophageal reflux disease) 04/24/2011  . Weight loss 04/24/2011  . Hyperlipidemia with target LDL less than 70 10/30/2007  . RENAL ARTERY STENOSIS 10/30/2007  . Peripheral vascular disease (HCC) 10/30/2007  . PEPTIC ULCER DISEASE 10/30/2007  . GASTROPARESIS 10/30/2007  . HIATAL HERNIA, HX OF 10/30/2007  . Essential hypertension 04/21/2007  . GERD 04/21/2007  . OSTEOARTHRITIS 04/21/2007  . GASTRITIS 03/03/2007  . GASTRIC OUTLET OBSTRUCTION  01/10/2004  . CAD S/P percutaneous coronary angioplasty: BMS to RCA in 2004; DES to LAD with PTCA of D2 in 2007;  patent in 2010 06/20/2002    Class: Diagnosis of  . ESOPHAGEAL STRICTURE 11/10/2001  . DIVERTICULOSIS, COLON 11/10/2001    Orientation RESPIRATION BLADDER Height & Weight     Self, Place, Situation  Normal Continent Weight: 133 lb (60.3 kg) Height:  4\' 9"  (144.8 cm)  BEHAVIORAL SYMPTOMS/MOOD NEUROLOGICAL BOWEL NUTRITION STATUS      Continent Diet (DYS 3, Thick fluid consistency)  AMBULATORY STATUS COMMUNICATION OF NEEDS Skin   Extensive Assist Verbally Normal                       Personal Care Assistance Level of Assistance  Bathing, Feeding, Dressing Bathing Assistance: Limited assistance Feeding assistance: Independent Dressing Assistance: Limited assistance     Functional Limitations Info  Sight, Hearing, Speech Sight Info: Impaired Hearing Info: Adequate Speech Info: Impaired (Slurred speech)    SPECIAL CARE FACTORS FREQUENCY  OT (By licensed OT), PT (By licensed PT)     PT Frequency: 5x OT Frequency: 5x            Contractures      Additional Factors Info  Code Status, Allergies Code Status Info: Full Code  Allergies Info: No known allergies            Current Medications (04/29/2016):  This is the current hospital active medication list Current Facility-Administered Medications  Medication Dose Route Frequency Provider Last Rate Last Dose  . 0.9 %  sodium chloride infusion   Intravenous Continuous Eduard Clos, MD 50  mL/hr at 04/29/16 0038    . ALPRAZolam Prudy Feeler(XANAX) tablet 0.5 mg  0.5 mg Oral QHS Eduard ClosArshad N Kakrakandy, MD   0.5 mg at 04/28/16 2252  . aspirin EC tablet 325 mg  325 mg Oral Daily Layne BentonSharon L Biby, NP   325 mg at 04/29/16 [id number]   Or  . aspirin suppository 300 mg  300 mg Rectal Daily Layne BentonSharon L Biby, NP      . calcium citrate (CALCITRATE - dosed in mg elemental calcium) tablet 100 mg of elemental calcium  100 mg of elemental  calcium Oral Daily Eduard ClosArshad N Kakrakandy, MD   100 mg of elemental calcium at 04/29/16 1000  . docusate sodium (COLACE) capsule 100 mg  100 mg Oral BID Eduard ClosArshad N Kakrakandy, MD   100 mg at 04/29/16 0949  . enoxaparin (LOVENOX) injection 30 mg  30 mg Subcutaneous Q24H Eduard ClosArshad N Kakrakandy, MD   30 mg at 04/29/16 1200  . folic acid (FOLVITE) tablet 1 mg  1 mg Oral Daily Eduard ClosArshad N Kakrakandy, MD   1 mg at 04/29/16 0949  . hydrALAZINE (APRESOLINE) injection 10 mg  10 mg Intravenous Q4H PRN Eduard ClosArshad N Kakrakandy, MD      . HYDROcodone-acetaminophen (NORCO/VICODIN) 5-325 MG per tablet 1 tablet  1 tablet Oral Q6H PRN Eduard ClosArshad N Kakrakandy, MD      . mirtazapine (REMERON) tablet 30 mg  30 mg Oral QHS Eduard ClosArshad N Kakrakandy, MD   30 mg at 04/28/16 2251  . multivitamin with minerals tablet 1 tablet  1 tablet Oral Daily Eduard ClosArshad N Kakrakandy, MD   1 tablet at 04/29/16 0949  . pantoprazole (PROTONIX) EC tablet 40 mg  40 mg Oral BID Eduard ClosArshad N Kakrakandy, MD   40 mg at 04/29/16 0949  . senna-docusate (Senokot-S) tablet 1 tablet  1 tablet Oral QHS PRN Eduard ClosArshad N Kakrakandy, MD      . temazepam (RESTORIL) capsule 30 mg  30 mg Oral QHS Eduard ClosArshad N Kakrakandy, MD   30 mg at 04/28/16 2251     Discharge Medications: Please see discharge summary for a list of discharge medications.  Relevant Imaging Results:  Relevant Lab Results:   Additional Information SSN: [id number]  Jonathon JordanLynn B Jamaul Heist, LCSWA

## 2016-04-29 NOTE — Progress Notes (Signed)
PROGRESS NOTE    [name]  ZOX:[id number] DOB: 1917/09/22 DOA: 04/26/2016 PCP: [name], MD   Brief Narrative:   [name] is a 82 y.o. female with history of CAD status post stenting, permanent A. fib, hypertension and diastolic CHF was brought to the ER to patient's family found the patient was having difficulty speaking and confusion. Patient's symptoms started yesterday morning after waking up. Patient also had a fall. In the ER patient was found to have mild right upper and lower extremity weakness. CT of the head shows left thalamic stroke. On call neurologist was consulted and patient is being admitted for further stroke workup. On my exam patient is alert awake oriented. Mild weakness in the right upper and lower extremity. Denies any headache visual symptoms or difficulty swallowing.  PT/OT to evaluated patient to assist with placement.   Assessment & Plan:   Active Problems:   Essential hypertension   CAD S/P percutaneous coronary angioplasty: BMS to RCA in 2004; DES to LAD with PTCA of D2 in 2007;  patent in 2010   Permanent atrial fibrillation (HCC)   Thrombotic stroke involving cerebral artery (HCC)   Stroke (cerebrum) (HCC)   Acute CVA (cerebrovascular accident) (HCC)   Acute left thalamic stroke  - MRI/MRA brain results: Acute 9 x 13 mm LEFT thalamus infarct corresponding to CT Abnormality. Old small LEFT cerebellar infarcts. Mild chronic small vessel ischemic disease. MRA HEAD: No emergent large vessel occlusion or severe stenosis. Slow flow versus occluded/ developmentally absent basilar tip with complete, robust circle of Willis. - carotid Doppler within normal limits - stopped plavix yesterday - aspirin daily - residual strength, balance and ambulation deficits- SNF recommended at discharge by PT/OT  Hypertension uncontrolled  - patient has not been on any medications for hypertension prior to admission - allow for permissive hypertension and  closely follow blood pressure trends - IV hydralazine for systolic blood pressure more than 220 and diastolic more than 120 - Most recent blood pressure slightly elevated- may need to consider starting medication if continues to stay elevated  Permanent A. Fib  - rate controlled without any medications - Chads 2 vasc score is more than 2 - Patient is not on anticoagulation which after discussing at length with patient's daughter and son aswell as reviewing patients records from her PCP she is not on anticoagulation due to fall risk - discussed risk and benefit of starting anticoagulation but patient's daughter and son agree that patient is took high risk of falling to start anticoagulation - will continue aspirin only  CAD status post stenting  - denies any chest pain - previously on plavix (d/c'ed yesterday) - Intolerant to statins in the past  History of diastolic CHF  - no acute issues at this time - will need to monitor IVF as patient has been receiving gentle hydration during admission  UA showing possible UTI  - patient appears to be asymptomatic - citrobacter koseri noted in urine - keflex 500mg  q12 x 7 days   DVT prophylaxis: Lovenox Code Status: Full code Family Communication: Talked to patients son, nephew and daughter about plan and disposition Disposition Plan: SNF recommended by PT/OT for discharge disposition   Consultants:   Neurology  PT  OT  Procedures:   none  Antimicrobials:   none    Subjective: Patient seen and evaluated.  She was asleep at time of exam but woke up easily.  She voices that she ate eggs this morning and slept well  last night.  Her family was later in the room when I walked by later.  She denies any chest pain, chest pressure, weakness.  Still needing assistance with ambulating even to the commode.  Patient states she feels well.  Discussed with patient and her daughter yesterday SNF for rehab and both patient and her daughter  agree that will likely be best given patient's debility.  Objective: Vitals:   04/28/16 2152 04/29/16 0302 04/29/16 0504 04/29/16 0918  BP: (!) 148/74 (!) 141/51 (!) 164/63 (!) 187/77  Pulse: (!) 113 66 (!) 59 62  Resp: 18 16 18 17   Temp: 98.4 F (36.9 C) 97.6 F (36.4 C) 97.6 F (36.4 C) 97.6 F (36.4 C)  TempSrc: Axillary Axillary Axillary Oral  SpO2: 99% 98% 98% 96%  Weight:      Height:       No intake or output data in the 24 hours ending 04/29/16 1148 Filed Weights   04/26/16 1953  Weight: 60.3 kg (133 lb)    Examination:  General exam: Appears calm and comfortable  Respiratory system: Clear to auscultation. Respiratory effort normal. Cardiovascular system: S1 & S2 heard, RRR. No JVD, murmurs, rubs, gallops or clicks. No pedal edema. Gastrointestinal system: Abdomen is nondistended, soft and nontender. No organomegaly or masses felt. Normal bowel sounds heard. Central nervous system: Alert and oriented. No focal neurological deficits. Extremities: slight weakness of right UE; minimal weakness of right lower extremity however I did not ambulate the patient.  CN II-XII grossly intact with no deficits appreciated. Skin: No rashes, lesions or ulcers Psychiatry: Oriented to person and place. Mood & affect appropriate. Poor insight    Data Reviewed: I have personally reviewed following labs and imaging studies  CBC:  Recent Labs Lab 04/26/16 2018 04/27/16 0421  WBC 10.0 11.9*  NEUTROABS 7.4  --   HGB 14.1 14.0  HCT 43.4 42.6  MCV 98.2 98.2  PLT 165 173   Basic Metabolic Panel:  Recent Labs Lab 04/26/16 2018 04/27/16 0507  NA 139 142  K 4.0 4.6  CL 105 103  CO2 23 25  GLUCOSE 140* 98  BUN 29* 28*  CREATININE 1.10* 1.03*  CALCIUM 10.3 10.5*   GFR: Estimated Creatinine Clearance: 24.4 mL/min (by C-G formula based on SCr of 1.03 mg/dL (H)). Liver Function Tests:  Recent Labs Lab 04/26/16 2018 04/27/16 0507  AST 22 22  ALT 17 16  ALKPHOS 78 73    BILITOT 0.3 0.4  PROT 7.2 7.4  ALBUMIN 3.7 3.6   No results for input(s): LIPASE, AMYLASE in the last 168 hours. No results for input(s): AMMONIA in the last 168 hours. Coagulation Profile: No results for input(s): INR, PROTIME in the last 168 hours. Cardiac Enzymes: No results for input(s): CKTOTAL, CKMB, CKMBINDEX, TROPONINI in the last 168 hours. BNP (last 3 results) No results for input(s): PROBNP in the last 8760 hours. HbA1C:  Recent Labs  04/27/16 0203  HGBA1C 6.0*   CBG:  Recent Labs Lab 04/26/16 1958  GLUCAP 139*   Lipid Profile:  Recent Labs  04/27/16 0203  CHOL 147  HDL 45  LDLCALC 75  TRIG 136  CHOLHDL 3.3   Thyroid Function Tests: No results for input(s): TSH, T4TOTAL, FREET4, T3FREE, THYROIDAB in the last 72 hours. Anemia Panel: No results for input(s): VITAMINB12, FOLATE, FERRITIN, TIBC, IRON, RETICCTPCT in the last 72 hours. Sepsis Labs: No results for input(s): PROCALCITON, LATICACIDVEN in the last 168 hours.  Recent Results (from the past 240  hour(s))  Culture, Urine     Status: Abnormal   Collection Time: 04/26/16 10:03 PM  Result Value Ref Range Status   Specimen Description URINE, RANDOM  Final   Special Requests NONE  Final   Culture >=100,000 COLONIES/mL CITROBACTER KOSERI (A)  Final   Report Status 04/29/2016 FINAL  Final   Organism ID, Bacteria CITROBACTER KOSERI (A)  Final      Susceptibility   Citrobacter koseri - MIC*    CEFAZOLIN <=4 SENSITIVE Sensitive     CEFTRIAXONE <=1 SENSITIVE Sensitive     CIPROFLOXACIN <=0.25 SENSITIVE Sensitive     GENTAMICIN <=1 SENSITIVE Sensitive     IMIPENEM <=0.25 SENSITIVE Sensitive     NITROFURANTOIN 32 SENSITIVE Sensitive     TRIMETH/SULFA <=20 SENSITIVE Sensitive     PIP/TAZO <=4 SENSITIVE Sensitive     * >=100,000 COLONIES/mL CITROBACTER KOSERI         Radiology Studies: No results found.      Scheduled Meds: . ALPRAZolam  0.5 mg Oral QHS  . aspirin EC  325 mg Oral Daily    Or  . aspirin  300 mg Rectal Daily  . calcium citrate  100 mg of elemental calcium Oral Daily  . docusate sodium  100 mg Oral BID  . enoxaparin (LOVENOX) injection  30 mg Subcutaneous Q24H  . folic acid  1 mg Oral Daily  . mirtazapine  30 mg Oral QHS  . multivitamin with minerals  1 tablet Oral Daily  . pantoprazole  40 mg Oral BID  . temazepam  30 mg Oral QHS   Continuous Infusions: . sodium chloride 50 mL/hr at 04/29/16 0038     LOS: 2 days    Time spent: 35 minutes    Bennett Scrape, MD [facility] Pager [phone]  If 7PM-7AM, please contact night-coverage [email or web address] Password [email or web address] 04/29/2016, 11:48 AM

## 2016-04-29 NOTE — Progress Notes (Signed)
[facility]. CSW presented bed offer to the family and family has decided to accept bed offer. [facility], facility is not able to accept pt today but will be able to facilitate a transfer for Monday. Weekday CSW notified.   Jonathon JordanLynn B Hersh Minney, MSW, LCSWA Weekend Coverage  [phone]

## 2016-04-29 NOTE — Clinical Social Work Note (Signed)
Clinical Social Work Assessment  Patient Details  Name: [name] MRN: [id number] Date of Birth: October 22, 1917  Date of referral:  04/29/16               Reason for consult:  Facility Placement                Permission sought to share information with:  Family Supports, Oceanographer[facility]ility Contact Representative Permission granted to share information::  Yes, Verbal Permission Granted  Name::        Agency::     Relationship::     Contact Information:     Housing/Transportation Living arrangements for the past 2 months:  Single Family Home Source of Information:  Patient, Adult Children Patient Interpreter Needed:  None Criminal Activity/Legal Involvement Pertinent to Current Situation/Hospitalization:  No - Comment as needed Significant Relationships:  Adult Children Lives with:  Self Do you feel safe going back to the place where you live?  No Need for family participation in patient care:  Yes (Comment)  Care giving concerns: Pt is currently unable to care for herself independently.   Social Worker assessment / plan: CSW received consult for possible SNF placement. CSW engaged with pt at her bedside with her family present at the time of the assessment. CSW introduced herself and her role as a Child psychotherapistsocial worker. CSW explained that PT is recommending SNF placement for the pt. The family states that they are agreeable with SNF and [facility]. CSW explained the referral process and stated that CSW would be working with the family to facilitate the referral and transfer. CSW will complete an FL2 for the pt and send referral to local facilites. When appropriate CSW will then present bed offers to the family and inform them of their freedom to choose. CSW will continue to follow pt and assist as needed.  Employment status:  Retired Database administratornsurance information:  Managed Medicare PT Recommendations:  Skilled Nursing Facility Information / Referral  to community resources:  Skilled Nursing Facility  Patient/Family's Response to care: Pt will d/c to SNF.  Patient/Family's Understanding of and Emotional Response to Diagnosis, Current Treatment, and Prognosis: Pt and pt's family are appreciative of the care provided by CSW at this time.  Emotional Assessment Appearance:  Appears stated age Attitude/Demeanor/Rapport:  Other (Cooperative) Affect (typically observed):  Calm, Pleasant Orientation:  Oriented to Self Alcohol / Substance use:  Not Applicable Psych involvement (Current and /or in the community):  No (Comment)  Discharge Needs  Concerns to be addressed:  Care Coordination Readmission within the last 30 days:  No Current discharge risk:  Dependent with Mobility Barriers to Discharge:  No Barriers Identified   Jonathon JordanLynn B Anu Stagner, LCSWA 04/29/2016, 12:34 PM

## 2016-04-29 NOTE — Progress Notes (Signed)
STROKE TEAM PROGRESS NOTE   HISTORY OF PRESENT ILLNESS (per record) [name] is an 82 y.o. female history of TIA, PAF, hypertension, hyperlipidemia and coronary artery disease presenting with confusion and slurred speech as well as history of falling at home. She's been on Plavix daily. CT scan of her head showed low density area involving left thalamus, indicative of probable acute ischemic infarction. No facial droop was described. NIH stroke score at the time of this evaluation was 4. She was LKW 9 AM on 04/26/2016.  Patient was not administered IV t-PA secondary to being beyond time under for treatment consideration. She was admitted for further evaluation and treatment.   SUBJECTIVE (INTERVAL HISTORY)  No changes.  Overall she feels her condition is stable.     OBJECTIVE Temp:  [97.6 F (36.4 C)-98.4 F (36.9 C)] 98 F (36.7 C) (10/08 1332) Pulse Rate:  [58-113] 65 (10/08 1332) Cardiac Rhythm: Atrial fibrillation (10/08 0700) Resp:  [16-18] 18 (10/08 1332) BP: (141-187)/(51-77) 152/72 (10/08 1332) SpO2:  [96 %-99 %] 99 % (10/08 1332)  CBC:   Recent Labs Lab 04/26/16 2018 04/27/16 0421  WBC 10.0 11.9*  NEUTROABS 7.4  --   HGB 14.1 14.0  HCT 43.4 42.6  MCV 98.2 98.2  PLT 165 173    Basic Metabolic Panel:   Recent Labs Lab 04/26/16 2018 04/27/16 0507  NA 139 142  K 4.0 4.6  CL 105 103  CO2 23 25  GLUCOSE 140* 98  BUN 29* 28*  CREATININE 1.10* 1.03*  CALCIUM 10.3 10.5*    Lipid Panel:     Component Value Date/Time   CHOL 147 04/27/2016 0203   TRIG 136 04/27/2016 0203   HDL 45 04/27/2016 0203   CHOLHDL 3.3 04/27/2016 0203   VLDL 27 04/27/2016 0203   LDLCALC 75 04/27/2016 0203   HgbA1c:  Lab Results  Component Value Date   HGBA1C 6.0 (H) 04/27/2016   Urine Drug Screen: No results found for: LABOPIA, COCAINSCRNUR, LABBENZ, AMPHETMU, THCU, LABBARB    IMAGING  Ct Head Wo Contrast Ct Cervical Spine Wo Contrast 04/26/2016 Atrophy with small  vessel chronic ischemic changes of deep cerebral white matter. Poorly defined low-attenuation within the anterior LEFT thalamus most consistent with acute infarct. No acute intracranial hemorrhage or cortical infarction. Multilevel degenerative disc and facet disease changes of the cervical spine. No acute cervical spine abnormalities.   Dg Pelvis Portable 04/27/2016 No acute abnormality.   MRI HEAD 04/27/2016 Acute 9 x 13 mm LEFT thalamus infarct corresponding to CT abnormality. Old small LEFT cerebellar infarcts. Mild chronic small vessel ischemic disease.   MRA HEAD 04/27/2016 No emergent large vessel occlusion or severe stenosis. Slow flow versus occluded/ developmentally absent basilar tip with complete, robust circle of Willis.     PHYSICAL EXAM Pleasant  Frail elderly lady not in distress. . Afebrile. Head is nontraumatic. Neck is supple without bruit.    Cardiac exam no murmur or gallop. Lungs are clear to auscultation. Distal pulses are well felt. Neurological Exam ;  Awake  Alert oriented x 2. Diminished attention, registration and recall.. Normal speech and language.eye movements full without nystagmus.fundi were not visualized. Vision acuity and fields appear normal. Hearing is normal. Palatal movements are normal. Face symmetric. Tongue midline. Normal strength, tone, reflexes and coordination. Normal sensation. Gait deferred.  ASSESSMENT/PLAN Ms. [name] is a 82 y.o. female with history of TIA, PAF, hypertension, hyperlipidemia and coronary artery disease presenting with slurred speech, confusion and recurrent falls.  She did not receive IV t-PA due to being beyond time under for treatment consideration .   Stroke:   left thalamic infarct, given location probably secondary to small vessel disease source though has atrial fibrillation   MRI  L thalamic infarct  MRA  No significant large vessel occlusion  Carotid Doppler  pending   2D Echo  pending   LDL  75  HgbA1c pending  Lovenox 30 mg sq daily for VTE prophylaxis DIET DYS 3 Room service appropriate? Yes; Fluid consistency: Nectar Thick  clopidogrel 75 mg daily prior to admission, now on clopidogrel 75 mg daily. (see below)  Ongoing aggressive stroke risk factor management  Therapy recommendations:  SNF Disposition: SNF Atrial Fibrillation  Home anticoagulation:  none   Has been on anticoagulation in the past per pt  No proven role for plavix in the setting of atrial fibrillation, so will change to aspirin for now  Prefer anticoagulation, but given pts frail state, falls and unreliable history, recommend Aspirin and not anticoagulation Dr. Pearlean Brownie discussed with DR. Ukleja   Hypertension  Permissive hypertension (OK if < 220/120) but gradually normalize in 5-7 days  Long-term BP goal normotensive  Hyperlipidemia  Home meds:  No statin  LDL 75, goal < 70  Per stroke guidelines, statin recommended  Patient intolerant to statins in the past. Given age, frailty, not worth it to risk myalgias given close to goal level  Hx diastolic CHF  Other Stroke Risk Factors  Advanced age  Hx TIA  Family hx stroke (mother, father)  Coronary artery disease s/p stents 2007, 2004  Other Active Problems  Possible URI  Hospital day # 2     I have personally examined this patient, reviewed notes, independently viewed imaging studies, participated in medical decision making and plan of care.ROS completed by me personally and pertinent positives fully documented  I have made any additions or clarifications directly to the above note. . Continue aspirin and d/w son on 04/28/16 reason for not being on anticoagulation in past for atrial fibrillation due to fall risk and frail status Stroke team will sign off and call for questions.Await SNF transfer Delia Heady, MD Medical Director [facility] Pager: [phone] 04/29/2016 1:47 PM  To contact Stroke Continuity  provider, please refer to [email or web address]. After hours, contact General Neurology

## 2016-04-30 ENCOUNTER — Inpatient Hospital Stay (HOSPITAL_COMMUNITY): Payer: Commercial Managed Care - HMO

## 2016-04-30 DIAGNOSIS — I6789 Other cerebrovascular disease: Secondary | ICD-10-CM

## 2016-04-30 DIAGNOSIS — N3000 Acute cystitis without hematuria: Secondary | ICD-10-CM

## 2016-04-30 LAB — ECHOCARDIOGRAM COMPLETE
Height: 57 in
Weight: 2128 oz

## 2016-04-30 MED ORDER — ASPIRIN 325 MG PO TBEC: 325.0000 mg | DELAYED_RELEASE_TABLET | Freq: Every day | ORAL | 0 refills | Status: AC

## 2016-04-30 MED ORDER — HYDROCODONE-ACETAMINOPHEN 5-325 MG PO TABS: 1.0000 | ORAL_TABLET | Freq: Four times a day (QID) | ORAL | 0 refills | Status: AC | PRN

## 2016-04-30 MED ORDER — CALCIUM CITRATE 950 (200 CA) MG PO TABS: 100.0000 mg | ORAL_TABLET | Freq: Every day | ORAL | Status: AC

## 2016-04-30 MED ORDER — ALPRAZOLAM 0.5 MG PO TABS: 0.5000 mg | ORAL_TABLET | Freq: Every day | ORAL | 0 refills | Status: AC

## 2016-04-30 MED ORDER — CEPHALEXIN 500 MG PO CAPS: 500.0000 mg | ORAL_CAPSULE | Freq: Two times a day (BID) | ORAL | 0 refills | Status: AC

## 2016-04-30 NOTE — Progress Notes (Signed)
Report called to Marcelino DusterMichelle, Charity fundraiserN [facility]. Lawson RadarHeather M Xiomara Sevillano

## 2016-04-30 NOTE — Clinical Social Work Note (Signed)
Pt is ready for discharge today and [facility]. [facility], and it was obtained [phone]). Pt and daughter are aware and agreeable to discharge plan. RN called report. [facility] will provide transportation. CSW is signing off as no further needs identified.   Dede QuerySarah Braydin Aloi, MSW, LCSW  Clinical Social Worker  [phone]

## 2016-04-30 NOTE — Clinical Social Work Placement (Signed)
   CLINICAL SOCIAL WORK PLACEMENT  NOTE  Date:  04/30/2016  Patient Details  Name: Diane MaesLillie K Woodyard MRN: [id number] Date of Birth: [birth year]  Clinical Social Work is seeking post-discharge placement for this patient at the Skilled  Nursing Facility level of care (*CSW will initial, date and re-position this form in  chart as items are completed):  Yes   Patient/family provided with [place] Clinical Social Work Department's list of facilities offering this level of care within the geographic area requested by the patient (or if unable, by the patient's family).  Yes   Patient/family informed of their freedom to choose among providers that offer the needed level of care, that participate in Medicare, Medicaid or managed care program needed by the patient, have an available bed and are willing to accept the patient.  Yes   Patient/family informed of [place]'s ownership interest in [facility] and [facility], as well as of the fact that they are under no obligation to receive care at these facilities.  PASRR submitted to EDS on       PASRR number received on       Existing PASRR number confirmed on 04/29/16     FL2 transmitted to all facilities in geographic area requested by pt/family on 04/29/16     FL2 transmitted to all facilities within larger geographic area on       Patient informed that his/her managed care company has contracts with or will negotiate with certain facilities, including the following:        Yes   Patient/family informed of bed offers received.  [facility]     Physician recommends and patient chooses bed at      [facility].  Patient to be transferred to facility by [facility]     Patient family notified on 04/30/16 of transfer.  Name of family member notified:  Pt's daughter, Alona BeneJoyce     PHYSICIAN       Additional Comment:     _______________________________________________ Dede QuerySarah Atlee Kluth, LCSW 04/30/2016, 3:27 PM

## 2016-04-30 NOTE — Care Management Note (Signed)
Case Management Note  Patient Details  Name: Diane MaesLillie K Jezek MRN: [id number] Date of Birth: 11/04/1917  Subjective/Objective:  Pt admitted with CVA. He is from home alone.                   Action/Plan: [facility]. No further needs per CM.   Expected Discharge Date:                  Expected Discharge Plan:  Skilled Nursing Facility  In-House Referral:     Discharge planning Services     Post Acute Care Choice:    Choice offered to:     DME Arranged:    DME Agency:     HH Arranged:    HH Agency:     Status of Service:  Completed, signed off  If discussed at [employer] Length of Stay Meetings, dates discussed:    Additional Comments:  Kermit BaloKelli F Omarrion Carmer, RN 04/30/2016, 2:10 PM

## 2016-04-30 NOTE — Progress Notes (Signed)
[facility] to [facility] today. IV removed, telemetry discontinued. Paperwork ready for transportation. Attempted to call report X 2.  Lawson RadarHeather M Petra Dumler

## 2016-04-30 NOTE — Progress Notes (Signed)
Speech Language Pathology Treatment: Dysphagia  Patient Details Name: Diane MaesLillie K Lamping MRN: [id number] DOB: 03/05/1918 Today's Date: 04/30/2016 Time: [id number] SLP Time Calculation (min) (ACUTE ONLY): 10 min  Assessment / Plan / Recommendation Clinical Impression  Skilled treatment session focused on addressing dysphagia goals. SLP facilitated session by providing skilled observation of thin liquids via cup with multiple swallows and cough in about 30% of trials, given ongoing intermittent overt s/s of aspiration patient was also observed with nectar-thick liquids via cup which resulted in no overt s/s.  Recommend to continue with current diet restrictions and follow up at the next level of care.       HPI HPI: 82 y.o.femalewith history of CAD status post stenting, permanent A. fib, hypertension and diastolic CHF admitted with acute confusion, weakness RUE/LE.  MRI Cute 9x13 mm left thalamic CVA.  Esophagram 04/04/15: moderate retention above the narrowing but a 13 mm barium pill successfully traversed the stenosis.      SLP Plan  Continue with current plan of care     Recommendations  Diet recommendations: Dysphagia 3 (mechanical soft);Nectar-thick liquid Liquids provided via: Cup Medication Administration: Whole meds with puree Supervision: Patient able to self feed;Intermittent supervision to cue for compensatory strategies Compensations: Minimize environmental distractions;Slow rate;Small sips/bites;Follow solids with liquid Postural Changes and/or Swallow Maneuvers: Seated upright 90 degrees;Upright 30-60 min after meal                Oral Care Recommendations: Oral care BID Follow up Recommendations: Skilled Nursing facility Plan: Continue with current plan of care       GO               Charlane FerrettiMelissa Payam Gribble, M.A., CCC-SLP [phone]  Tonjia Parillo 04/30/2016, 1:51 PM

## 2016-04-30 NOTE — Care Management Important Message (Signed)
Important Message  Patient Details  Name: [name] MRN: [id number] Date of Birth: [birth year]   Medicare Important Message Given:  Yes    Karem Farha 04/30/2016, 11:53 AM

## 2016-04-30 NOTE — Progress Notes (Signed)
  Echocardiogram 2D Echocardiogram has been performed.  [name], [name] 04/30/2016, 9:53 AM

## 2016-04-30 NOTE — Consult Note (Signed)
[facility] CM Primary Care Navigator  04/30/2016  [name] April 30, 1918 [id number]  Met with patient and family (nephew and niece) at the bedside to identify possible discharge needs. Patient states she felt weak and had a fall that led to this admission.  She endorses [name] of [facility] [place] as the primary care provider.    Patient shared using [facility] home delivery pharmacy and [facility] [place] to obtain medications with no difficulty. She states that daughter picks up immediate medicines needed but she manages own medicines at home using "pill box" system.   Daughter [name]) provides transportation to her doctors' appointments. She lives alone but her daughter lives very close by who provides assistance with her needs as stated.  [employer] transportation benefits discussed with patient and family as well.  Discharge plan is for short term SNF placement at [facility] until she recovers and is more able per patient.  Patient and family voiced understanding to call primary care provider's office for a post discharge follow-up appointment once discharge from SNF or sooner if needs arise. Patient letter provided as a reminder.  Patient denies any further needs or concerns at this time.  For additional questions please contact:  [name], BSN, RN-BC [facility] PRIMARY CARE Navigator Cell: [phone]

## 2016-04-30 NOTE — Progress Notes (Signed)
[facility] arrived to take patient [facility]. Pt has had dinner and is ready to be transported. Lawson RadarHeather M Taleia Sadowski

## 2016-04-30 NOTE — Discharge Summary (Signed)
Physician Discharge Summary  [name] ZOX:[id number] DOB: 02-Jul-1918 DOA: 04/26/2016  PCP: [name], MD  Admit date: 04/26/2016 Discharge date: 04/30/2016  Admitted From: Home Disposition:  SNF- [facility]  Recommendations for Outpatient Follow-up:  1. Follow up with PCP in 1-2 weeks 2. Neurology follow up in 6 weeks (order placed in Epic by Dr. Pearlean Brownie) 3. Continue with Aspirin daily 4. Continue antibiotic for 4 additional days   Home Health: No Equipment/Devices: None  Discharge Condition: Stable CODE STATUS:Full code Diet recommendation: Heart Healthy  Brief/Interim Summary: [name] a 82 y.o.femalewith history of CAD status post stenting, permanent A. fib, hypertension and diastolic CHF was brought to the ER to patient's family found the patient was having difficulty speaking and confusion. Patient's symptoms started yesterday morning after waking up. Patient also had a fall. In the ER patient was found to have mild right upper and lower extremity weakness. CT of the head shows left thalamic stroke. On call neurologist was consulted and patient is being admitted for further stroke workup. On my exam patient is alert awake oriented. Mild weakness in the right upper and lower extremity. Denies any headache visual symptoms or difficulty swallowing. PT/OT to evaluated patient to assist with placement.  [facility] accepted patient.  Discharge Diagnoses:  Active Problems:   Essential hypertension   CAD S/P percutaneous coronary angioplasty: BMS to RCA in 2004; DES to LAD with PTCA of D2 in 2007;  patent in 2010   Permanent atrial fibrillation (HCC)   Thrombotic stroke involving cerebral artery (HCC)   Stroke (cerebrum) (HCC)   Acute CVA (cerebrovascular accident) (HCC)  Acute left thalamic stroke - MRI/MRA brain results: Acute 9 x 13 mm LEFT thalamus infarct corresponding to CT Abnormality. Old small LEFT cerebellar infarcts. Mild chronic small vessel  ischemic disease. MRA HEAD: No emergent large vessel occlusion or severe stenosis. Slow flow versus occluded/ developmentally absent basilar tip with complete, robust circle of Willis. - carotid Doppler within normal limits - stopped plavix yesterday - aspirin daily - residual strength, balance and ambulation deficits- SNF recommended at discharge by PT/OT  Hypertension uncontrolled - patient has not been on any medications for hypertension prior to admission - allow for permissive hypertension and closely follow blood pressure trends - IV hydralazine for systolic blood pressure more than 220 and diastolic more than 120 - Most recent blood pressure slightly elevated- may need to consider starting medication if continues to stay elevated  Permanent A. Fib - rate controlled without any medications - Chads 2 vasc score is more than 2 - Patient is not on anticoagulation which after discussing at length with patient's daughter and son aswell as reviewing patients records from her PCP she is not on anticoagulation due to fall risk - discussed risk and benefit of starting anticoagulation but patient's daughter and son agree that patient is took high risk of falling to start anticoagulation - will continue aspirin only  CAD status post stenting - denies any chest pain - previously on plavix (d/c'ed during admission) - Intolerant to statins in the past  History of diastolic CHF - no acute issues at this time - will need to monitor IVF as patient has been receiving gentle hydration during admission  UA showing possible UTI - patient appears to be asymptomatic but unclear if some of confusion can be attributed to UTI - citrobacter koseri noted in urine - keflex 500mg  q12 x 5 days  Discharge Instructions  Discharge Instructions    Ambulatory  referral to Neurology    Complete by:  As directed    Follow up with Dr. Pearlean Brownie at [facility] in 6-8 weeks. Thanks       Medication List     STOP taking these medications   clopidogrel 75 MG tablet Commonly known as:  PLAVIX     TAKE these medications   acetaminophen 500 MG tablet Commonly known as:  TYLENOL Take 1,000 mg by mouth every 6 (six) hours as needed for mild pain.   ALPRAZolam 0.5 MG tablet Commonly known as:  XANAX Take 1 tablet (0.5 mg total) by mouth at bedtime.   aspirin 325 MG EC tablet Take 1 tablet (325 mg total) by mouth daily.   calcium citrate 950 MG tablet Commonly known as:  CALCITRATE - dosed in mg elemental calcium Take 0.5 tablets (100 mg of elemental calcium total) by mouth daily.   calcium gluconate 500 MG tablet Take 500 mg by mouth 2 (two) times daily.   cephALEXin 500 MG capsule Commonly known as:  KEFLEX Take 1 capsule (500 mg total) by mouth every 12 (twelve) hours.   docusate sodium 100 MG capsule Commonly known as:  COLACE Take 100 mg by mouth 2 (two) times daily.   folic acid 1 MG tablet Commonly known as:  FOLVITE Take 1 mg by mouth daily.   HYDROcodone-acetaminophen 5-325 MG tablet Commonly known as:  NORCO/VICODIN TAKE 1 TABLET BY MOUTH EVERY 6 HOURS AS NEEDED FOR PAIN  **MUST LAST 30 DAYS PER MD** What changed:  Another medication with the same name was added. Make sure you understand how and when to take each.   HYDROcodone-acetaminophen 5-325 MG tablet Commonly known as:  NORCO/VICODIN Take 1 tablet by mouth every 6 (six) hours as needed for severe pain. What changed:  You were already taking a medication with the same name, and this prescription was added. Make sure you understand how and when to take each.   mirtazapine 30 MG tablet Commonly known as:  REMERON Take 30 mg by mouth at bedtime.   multivitamin with minerals Tabs tablet Take 1 tablet by mouth daily.   pantoprazole 40 MG tablet Commonly known as:  PROTONIX Take 1 tablet (40 mg total) by mouth 2 (two) times daily.   temazepam 30 MG capsule Commonly known as:  RESTORIL Take 30 mg by mouth at  bedtime.   VITAMIN D-3 PO Take 1,000 mg by mouth daily.      Follow-up Information    SETHI,PRAMOD, MD. Schedule an appointment as soon as possible for a visit in 6 week(s).   Specialties:  Neurology, Radiology Contact information: [address] [place] [address] [phone]          No Known Allergies  Consultations:  Neurology  PT/ OT   Procedures/Studies: Ct Head Wo Contrast  Result Date: 04/26/2016 CLINICAL DATA:  Became dizzy while sitting in a chair in the kitchen, fell and hit her head, abrasions to face, slight neck pain, found on floor at 0900 hours, slurred speech, history coronary artery disease post PTCA, PAF EXAM: CT HEAD WITHOUT CONTRAST CT CERVICAL SPINE WITHOUT CONTRAST TECHNIQUE: Multidetector CT imaging of the head and cervical spine was performed following the standard protocol without intravenous contrast. Multiplanar CT image reconstructions of the cervical spine were also generated. COMPARISON:  None FINDINGS: CT HEAD FINDINGS Brain: Generalized atrophy. Normal ventricular morphology. No midline shift or mass effect. Small vessel chronic ischemic changes of deep cerebral white matter. Artery low-attenuation at  the anterior LEFT thalamus likely representing acute infarct. No intracranial hemorrhage, cortical infarct, mass lesion or extra-axial fluid collection. Vascular: Atherosclerotic calcification throughout the carotid siphons and at the vertebral arteries. Skull: Intact Sinuses/Orbits: Clear Other: N/A CT CERVICAL SPINE FINDINGS Alignment: Normal Skull base and vertebrae: Skullbase intact. Vertebral body heights maintained. Multilevel facet degenerative changes. No fracture or bone destruction. Significant degenerative changes and spur formation at the RIGHT C1-C2 articulation. Scattered multilevel neural foraminal narrowing bilaterally by spurs. Soft tissues and spinal canal: Prevertebral soft tissues normal thickness. Spinal canal patent.  Disc levels: Multilevel disc space narrowing and minimal endplate spur formation. Upper chest: Emphysematous but clear Other: Nonspecific tiny BILATERAL thyroid nodules bilaterally. Carotid atherosclerotic calcification bilaterally. IMPRESSION: Atrophy with small vessel chronic ischemic changes of deep cerebral white matter. Poorly defined low-attenuation within the anterior LEFT thalamus most consistent with acute infarct. No acute intracranial hemorrhage or cortical infarction. Multilevel degenerative disc and facet disease changes of the cervical spine. No acute cervical spine abnormalities. Electronically Signed   By: Ulyses Southward M.D.   On: 04/26/2016 21:07   Ct Cervical Spine Wo Contrast  Result Date: 04/26/2016 CLINICAL DATA:  Became dizzy while sitting in a chair in the kitchen, fell and hit her head, abrasions to face, slight neck pain, found on floor at 0900 hours, slurred speech, history coronary artery disease post PTCA, PAF EXAM: CT HEAD WITHOUT CONTRAST CT CERVICAL SPINE WITHOUT CONTRAST TECHNIQUE: Multidetector CT imaging of the head and cervical spine was performed following the standard protocol without intravenous contrast. Multiplanar CT image reconstructions of the cervical spine were also generated. COMPARISON:  None FINDINGS: CT HEAD FINDINGS Brain: Generalized atrophy. Normal ventricular morphology. No midline shift or mass effect. Small vessel chronic ischemic changes of deep cerebral white matter. Artery low-attenuation at the anterior LEFT thalamus likely representing acute infarct. No intracranial hemorrhage, cortical infarct, mass lesion or extra-axial fluid collection. Vascular: Atherosclerotic calcification throughout the carotid siphons and at the vertebral arteries. Skull: Intact Sinuses/Orbits: Clear Other: N/A CT CERVICAL SPINE FINDINGS Alignment: Normal Skull base and vertebrae: Skullbase intact. Vertebral body heights maintained. Multilevel facet degenerative changes. No  fracture or bone destruction. Significant degenerative changes and spur formation at the RIGHT C1-C2 articulation. Scattered multilevel neural foraminal narrowing bilaterally by spurs. Soft tissues and spinal canal: Prevertebral soft tissues normal thickness. Spinal canal patent. Disc levels: Multilevel disc space narrowing and minimal endplate spur formation. Upper chest: Emphysematous but clear Other: Nonspecific tiny BILATERAL thyroid nodules bilaterally. Carotid atherosclerotic calcification bilaterally. IMPRESSION: Atrophy with small vessel chronic ischemic changes of deep cerebral white matter. Poorly defined low-attenuation within the anterior LEFT thalamus most consistent with acute infarct. No acute intracranial hemorrhage or cortical infarction. Multilevel degenerative disc and facet disease changes of the cervical spine. No acute cervical spine abnormalities. Electronically Signed   By: Ulyses Southward M.D.   On: 04/26/2016 21:07   Mr Brain Wo Contrast  Result Date: 04/27/2016 CLINICAL DATA:  Slurred speech and fall at home. Followup probable LEFT thalamus infarct. History of hypertension, hyperlipidemia. EXAM: MRI HEAD WITHOUT CONTRAST MRA HEAD WITHOUT CONTRAST TECHNIQUE: Multiplanar, multiecho pulse sequences of the brain and surrounding structures were obtained without intravenous contrast. Angiographic images of the head were obtained using MRA technique without contrast. COMPARISON:  CT HEAD April 26, 2016 and MRI of the brain July 29, 2007 FINDINGS: MRI HEAD FINDINGS BRAIN: 9 x 13 mm ovoid focus of reduced diffusion mesial LEFT thalamus with corresponding low ADC values and bright FLAIR T2 hyperintense signal.  No susceptibility artifact to suggest hemorrhage. The ventricles and sulci are normal for patient's age. No suspicious parenchymal signal, masses or mass effect. Patchy supratentorial white matter FLAIR T2 hyperintensities. Old small LEFT cerebellar infarcts. No abnormal extra-axial fluid  collections. No extra-axial masses though, contrast enhanced sequences would be more sensitive. VASCULAR: Normal major intracranial vascular flow voids present at skull base. SKULL AND UPPER CERVICAL SPINE: No abnormal sellar expansion. No suspicious calvarial bone marrow signal. Craniocervical junction maintained. SINUSES/ORBITS: Minimal paranasal sinus mucosal thickening. Mild RIGHT mastoid effusion. Status post bilateral ocular lens implants. The included ocular globes and orbital contents are non-suspicious. OTHER: Patient is edentulous. MRA HEAD FINDINGS ANTERIOR CIRCULATION: Normal flow related enhancement of the included cervical, petrous, cavernous and supraclinoid internal carotid arteries. Patent anterior communicating artery. Normal flow related enhancement of the anterior and middle cerebral arteries, including distal segments. No large vessel occlusion, high-grade stenosis, abnormal luminal irregularity, aneurysm. POSTERIOR CIRCULATION: LEFT vertebral artery is dominant. Proximal basilar artery is patent with normal flow related enhancement. Poor visualization of basilar tip. Normal flow related enhancement of the posterior cerebral arteries. Robust bilateral posterior communicating arteries present, possible fetal origin. No large vessel occlusion, high-grade stenosis, abnormal luminal irregularity, aneurysm. IMPRESSION: MRI HEAD: Acute 9 x 13 mm LEFT thalamus infarct corresponding to CT abnormality. Old small LEFT cerebellar infarcts. Mild chronic small vessel ischemic disease. MRA HEAD: No emergent large vessel occlusion or severe stenosis. Slow flow versus occluded/ developmentally absent basilar tip with complete, robust circle of Willis. Electronically Signed   By: Awilda Metro M.D.   On: 04/27/2016 06:21   Dg Pelvis Portable  Result Date: 04/27/2016 CLINICAL DATA:  Fall. EXAM: PORTABLE PELVIS 1-2 VIEWS COMPARISON:  05/09/2011 . FINDINGS: Degenerative changes lumbar spine. Bilateral hip  replacements. Diffuse osteopenia. No acute bony abnormality. If symptoms persist full pelvic series can be obtained. IMPRESSION: No acute abnormality. Electronically Signed   By: Maisie Fus  Register   On: 04/27/2016 07:34   Mr Maxine Glenn Head/brain Wo Cm  Result Date: 04/27/2016 CLINICAL DATA:  Slurred speech and fall at home. Followup probable LEFT thalamus infarct. History of hypertension, hyperlipidemia. EXAM: MRI HEAD WITHOUT CONTRAST MRA HEAD WITHOUT CONTRAST TECHNIQUE: Multiplanar, multiecho pulse sequences of the brain and surrounding structures were obtained without intravenous contrast. Angiographic images of the head were obtained using MRA technique without contrast. COMPARISON:  CT HEAD April 26, 2016 and MRI of the brain July 29, 2007 FINDINGS: MRI HEAD FINDINGS BRAIN: 9 x 13 mm ovoid focus of reduced diffusion mesial LEFT thalamus with corresponding low ADC values and bright FLAIR T2 hyperintense signal. No susceptibility artifact to suggest hemorrhage. The ventricles and sulci are normal for patient's age. No suspicious parenchymal signal, masses or mass effect. Patchy supratentorial white matter FLAIR T2 hyperintensities. Old small LEFT cerebellar infarcts. No abnormal extra-axial fluid collections. No extra-axial masses though, contrast enhanced sequences would be more sensitive. VASCULAR: Normal major intracranial vascular flow voids present at skull base. SKULL AND UPPER CERVICAL SPINE: No abnormal sellar expansion. No suspicious calvarial bone marrow signal. Craniocervical junction maintained. SINUSES/ORBITS: Minimal paranasal sinus mucosal thickening. Mild RIGHT mastoid effusion. Status post bilateral ocular lens implants. The included ocular globes and orbital contents are non-suspicious. OTHER: Patient is edentulous. MRA HEAD FINDINGS ANTERIOR CIRCULATION: Normal flow related enhancement of the included cervical, petrous, cavernous and supraclinoid internal carotid arteries. Patent anterior  communicating artery. Normal flow related enhancement of the anterior and middle cerebral arteries, including distal segments. No large vessel occlusion, high-grade stenosis, abnormal luminal  irregularity, aneurysm. POSTERIOR CIRCULATION: LEFT vertebral artery is dominant. Proximal basilar artery is patent with normal flow related enhancement. Poor visualization of basilar tip. Normal flow related enhancement of the posterior cerebral arteries. Robust bilateral posterior communicating arteries present, possible fetal origin. No large vessel occlusion, high-grade stenosis, abnormal luminal irregularity, aneurysm. IMPRESSION: MRI HEAD: Acute 9 x 13 mm LEFT thalamus infarct corresponding to CT abnormality. Old small LEFT cerebellar infarcts. Mild chronic small vessel ischemic disease. MRA HEAD: No emergent large vessel occlusion or severe stenosis. Slow flow versus occluded/ developmentally absent basilar tip with complete, robust circle of Willis. Electronically Signed   By: Awilda Metro M.D.   On: 04/27/2016 06:21    Echo pending   Subjective: Patient seen and evaluated.  She is getting her echocardiogram done during time of assessment.  She slept well last night and voiced she ate well this morning.  [facility] accepted patient for skilled nursing and rehab.   Discharge Exam: Vitals:   04/30/16 0330 04/30/16 0637  BP: (!) 162/71 (!) 168/60  Pulse: (!) 44 72  Resp: 18 16  Temp: 97.5 F (36.4 C) 97.6 F (36.4 C)   Vitals:   04/29/16 1812 04/29/16 2222 04/30/16 0330 04/30/16 0637  BP: (!) 153/66 (!) 142/76 (!) 162/71 (!) 168/60  Pulse: (!) 56 74 (!) 44 72  Resp: 17 18 18 16   Temp: 98.2 F (36.8 C) 98.1 F (36.7 C) 97.5 F (36.4 C) 97.6 F (36.4 C)  TempSrc: Oral Oral Oral Oral  SpO2: 98% 98% 98% 98%  Weight:      Height:        General: Pt is alert, awake, not in acute distress Cardiovascular: irregularly irregular Rhythm, S1/S2 +, no rubs, no gallops Respiratory: CTA  bilaterally, no wheezing, no rhonchi Abdominal: Soft, NT, ND, bowel sounds + Extremities: no edema, no cyanosis    The results of significant diagnostics from this hospitalization (including imaging, microbiology, ancillary and laboratory) are listed below for reference.     Microbiology: Recent Results (from the past 240 hour(s))  Culture, Urine     Status: Abnormal   Collection Time: 04/26/16 10:03 PM  Result Value Ref Range Status   Specimen Description URINE, RANDOM  Final   Special Requests NONE  Final   Culture >=100,000 COLONIES/mL CITROBACTER KOSERI (A)  Final   Report Status 04/29/2016 FINAL  Final   Organism ID, Bacteria CITROBACTER KOSERI (A)  Final      Susceptibility   Citrobacter koseri - MIC*    CEFAZOLIN <=4 SENSITIVE Sensitive     CEFTRIAXONE <=1 SENSITIVE Sensitive     CIPROFLOXACIN <=0.25 SENSITIVE Sensitive     GENTAMICIN <=1 SENSITIVE Sensitive     IMIPENEM <=0.25 SENSITIVE Sensitive     NITROFURANTOIN 32 SENSITIVE Sensitive     TRIMETH/SULFA <=20 SENSITIVE Sensitive     PIP/TAZO <=4 SENSITIVE Sensitive     * >=100,000 COLONIES/mL CITROBACTER KOSERI     Labs: BNP (last 3 results) No results for input(s): BNP in the last 8760 hours. Basic Metabolic Panel:  Recent Labs Lab 04/26/16 2018 04/27/16 0507  NA 139 142  K 4.0 4.6  CL 105 103  CO2 23 25  GLUCOSE 140* 98  BUN 29* 28*  CREATININE 1.10* 1.03*  CALCIUM 10.3 10.5*   Liver Function Tests:  Recent Labs Lab 04/26/16 2018 04/27/16 0507  AST 22 22  ALT 17 16  ALKPHOS 78 73  BILITOT 0.3 0.4  PROT 7.2 7.4  ALBUMIN 3.7  3.6   No results for input(s): LIPASE, AMYLASE in the last 168 hours. No results for input(s): AMMONIA in the last 168 hours. CBC:  Recent Labs Lab 04/26/16 2018 04/27/16 0421  WBC 10.0 11.9*  NEUTROABS 7.4  --   HGB 14.1 14.0  HCT 43.4 42.6  MCV 98.2 98.2  PLT 165 173   Cardiac Enzymes: No results for input(s): CKTOTAL, CKMB, CKMBINDEX, TROPONINI in the last  168 hours. BNP: Invalid input(s): POCBNP CBG:  Recent Labs Lab 04/26/16 1958  GLUCAP 139*   D-Dimer No results for input(s): DDIMER in the last 72 hours. Hgb A1c No results for input(s): HGBA1C in the last 72 hours. Lipid Profile No results for input(s): CHOL, HDL, LDLCALC, TRIG, CHOLHDL, LDLDIRECT in the last 72 hours. Thyroid function studies No results for input(s): TSH, T4TOTAL, T3FREE, THYROIDAB in the last 72 hours.  Invalid input(s): FREET3 Anemia work up No results for input(s): VITAMINB12, FOLATE, FERRITIN, TIBC, IRON, RETICCTPCT in the last 72 hours. Urinalysis    Component Value Date/Time   COLORURINE YELLOW 04/26/2016 2203   APPEARANCEUR HAZY (A) 04/26/2016 2203   LABSPEC 1.011 04/26/2016 2203   PHURINE 6.0 04/26/2016 2203   GLUCOSEU NEGATIVE 04/26/2016 2203   HGBUR TRACE (A) 04/26/2016 2203   BILIRUBINUR NEGATIVE 04/26/2016 2203   KETONESUR NEGATIVE 04/26/2016 2203   PROTEINUR NEGATIVE 04/26/2016 2203   UROBILINOGEN 0.2 01/06/2015 1630   NITRITE NEGATIVE 04/26/2016 2203   LEUKOCYTESUR LARGE (A) 04/26/2016 2203   Sepsis Labs Invalid input(s): PROCALCITONIN,  WBC,  LACTICIDVEN Microbiology Recent Results (from the past 240 hour(s))  Culture, Urine     Status: Abnormal   Collection Time: 04/26/16 10:03 PM  Result Value Ref Range Status   Specimen Description URINE, RANDOM  Final   Special Requests NONE  Final   Culture >=100,000 COLONIES/mL CITROBACTER KOSERI (A)  Final   Report Status 04/29/2016 FINAL  Final   Organism ID, Bacteria CITROBACTER KOSERI (A)  Final      Susceptibility   Citrobacter koseri - MIC*    CEFAZOLIN <=4 SENSITIVE Sensitive     CEFTRIAXONE <=1 SENSITIVE Sensitive     CIPROFLOXACIN <=0.25 SENSITIVE Sensitive     GENTAMICIN <=1 SENSITIVE Sensitive     IMIPENEM <=0.25 SENSITIVE Sensitive     NITROFURANTOIN 32 SENSITIVE Sensitive     TRIMETH/SULFA <=20 SENSITIVE Sensitive     PIP/TAZO <=4 SENSITIVE Sensitive     * >=100,000  COLONIES/mL CITROBACTER KOSERI     Time coordinating discharge: Less than 30 minutes  SIGNED:   Bennett ScrapeAlex Ukleja, MD  [facility] 04/30/2016, 9:06 AM Pager [phone] If 7PM-7AM, please contact night-coverage [email or web address] Password [email or web address]

## 2016-05-01 ENCOUNTER — Non-Acute Institutional Stay (SKILLED_NURSING_FACILITY): Payer: Commercial Managed Care - HMO | Admitting: Internal Medicine

## 2016-05-01 ENCOUNTER — Encounter: Payer: Self-pay | Admitting: Internal Medicine

## 2016-05-01 DIAGNOSIS — D72829 Elevated white blood cell count, unspecified: Secondary | ICD-10-CM | POA: Diagnosis not present

## 2016-05-01 DIAGNOSIS — I4821 Permanent atrial fibrillation: Secondary | ICD-10-CM

## 2016-05-01 DIAGNOSIS — F329 Major depressive disorder, single episode, unspecified: Secondary | ICD-10-CM

## 2016-05-01 DIAGNOSIS — N3000 Acute cystitis without hematuria: Secondary | ICD-10-CM

## 2016-05-01 DIAGNOSIS — N183 Chronic kidney disease, stage 3 unspecified: Secondary | ICD-10-CM

## 2016-05-01 DIAGNOSIS — I639 Cerebral infarction, unspecified: Secondary | ICD-10-CM | POA: Diagnosis not present

## 2016-05-01 DIAGNOSIS — E440 Moderate protein-calorie malnutrition: Secondary | ICD-10-CM | POA: Diagnosis not present

## 2016-05-01 DIAGNOSIS — I482 Chronic atrial fibrillation: Secondary | ICD-10-CM

## 2016-05-01 DIAGNOSIS — R2681 Unsteadiness on feet: Secondary | ICD-10-CM | POA: Diagnosis not present

## 2016-05-01 DIAGNOSIS — F32A Depression, unspecified: Secondary | ICD-10-CM

## 2016-05-01 DIAGNOSIS — K5909 Other constipation: Secondary | ICD-10-CM | POA: Diagnosis not present

## 2016-05-01 DIAGNOSIS — K219 Gastro-esophageal reflux disease without esophagitis: Secondary | ICD-10-CM

## 2016-05-01 DIAGNOSIS — G4700 Insomnia, unspecified: Secondary | ICD-10-CM | POA: Diagnosis not present

## 2016-05-01 NOTE — Progress Notes (Signed)
LOCATION: [facility]  PCP: [name], MD   Code Status: Full Code  Goals of care: Advanced Directive information Advanced Directives 04/26/2016  Does patient have an advance directive? No  Type of Advance Directive -  Copy of advanced directive(s) in chart? -  Would patient like information on creating an advanced directive? No - patient declined information  Pre-existing out of facility DNR order (yellow form or pink MOST form) -       Extended Emergency Contact Information Primary Emergency Contact: Ellison,Joyce Address: [address]           [place], [place] [address] [place] Home Phone: [phone] Mobile Phone: [phone] Relation: Daughter   No Known Allergies  Chief Complaint  Patient presents with  . New Admit To SNF    New Admission Visit     HPI:  Patient is a 82 y.o. female seen today for short term rehabilitation post hospital admission from 04/26/16-04/30/16 with acute left thalamic stroke with right sided weakness and UTI. She was seen by neurology and started on aspirin. Her plavix was discontinued. She has PMH of HTN, afib, CAD s/p stent, diastolic CHF among others. She is seen in her room today.   Review of Systems:  Constitutional: Negative for fever. Energy level is slowly coming back. HENT: Negative for headache, congestion, nasal discharge, difficulty swallowing.   Eyes: Negative for discharge.  Respiratory: Negative for cough, shortness of breath and wheezing.   Cardiovascular: Negative for chest pain, palpitations, leg swelling.  Gastrointestinal: Negative for heartburn, nausea, vomiting, abdominal pain. Last bowel movement was last night. Genitourinary: Negative for dysuria.  Musculoskeletal: Negative for back pain, fall in the facility.  Skin: Negative for itching, rash.  Neurological: Positive for occasional dizziness. Psychiatric/Behavioral: Negative for depression.   Past Medical History:    Diagnosis Date  . Anxiety   . Arthritis    R Hip total Arthroplasty & L Hip Hemiarthroplasty  . CAD S/P percutaneous coronary angioplasty 2004   ZETA BMS 4.0 mm X 15 mm in RCA - 2004; CYPHER DES 2.85mm x 18 mm to LAD (with PTCA of jailed D2 with ostial 85%)& 2 in 2007  . Cataract    removed bil eyes  . Depression   . Diverticulosis   . Gastric outlet obstruction 2012   remote -- s/p Vagotomy & Pyeloroplasty  . GERD (gastroesophageal reflux disease)    TIA  . History of blood transfusion    With Hip Surgery  . Hyperlipidemia   . Hypertension    H/o Renovascular HTN, s/p R RA Stent in 06/2003  . Multiple allergies   . Osteoporosis    s/p L hip Fxr - Hemiarthroplasty  . PAF (paroxysmal atrial fibrillation) [facility]) April 2015   Newly diagnosed by PCP  . PUD (peptic ulcer disease)    Gastric Ulcer  . Renal artery stenosis, native (HCC) 2003-2004   S/P R Renal A Stent 5 mm x 12 mm  . Stroke (HCC) 04/2016  . TIA (transient ischemic attack)    Past Surgical History:  Procedure Laterality Date  . CARDIAC CATHETERIZATION  01/17/2009   normal L main, normal LAD, normal L Cfx, RCA with patent stent (Dr. Erlene Quan)  . CATARACT EXTRACTION Bilateral   . COLONOSCOPY    . CORONARY ANGIOPLASTY WITH STENT PLACEMENT  2007   hx/notes 01/06/2015  . HIP ARTHROPLASTY  11/26/2011   Procedure: ARTHROPLASTY BIPOLAR HIP;  Surgeon: Javier Docker, MD;  Location: [facility]  OR;  Service: Orthopedics;  Laterality: Left;  . JOINT REPLACEMENT    . NM MYOCAR PERF WALL MOTION  08/2007   persantine myoview - normal pattern of perfusion, EF 80%, low risk scan  . PERCUTANEOUS CORONARY STENT INTERVENTION (PCI-S)  06/24/2003   ACS Zeta BMS 4.0x34mm to RCA (Dr. Jonette Eva)  . PERCUTANEOUS CORONARY STENT INTERVENTION (PCI-S)  11/13/2005   Cypher DES 2.5x66mm to LAD; Cutting PTCA of Ostial D2 (jailed) (Dr. Jonette Eva)  . RENAL ARTERY STENT  07/22/2003   Cordis 5x76mm stent to right renal artery (Dr. Jonette Eva)  .  REPLACEMENT TOTAL HIP W/  RESURFACING IMPLANTS Right 04/2002  . TONSILLECTOMY    . TRANSTHORACIC ECHOCARDIOGRAM  11/23/2013; 01/07/2015:   a) EF 55-60%, no regional WMA; MAC without MR;; b) normal LV size and thickness. EF 50-55%. No RWMA, mild LA dilation with mod PR  . VAGOTOMY AND PYLOROPLASTY  01/13/04   Social History:   reports that she has never smoked. She has never used smokeless tobacco. She reports that she does not drink alcohol or use drugs.  Family History  Problem Relation Age of Onset  . Hypertension Mother   . Stroke Mother   . Hypertension Father   . Stroke Father   . Uterine cancer Sister   . Heart disease Sister   . Diabetes Son   . Heart disease Brother   . Esophageal cancer Neg Hx   . Colon cancer Neg Hx   . Stomach cancer Neg Hx     Medications:   Medication List       Accurate as of 05/01/16 12:54 PM. Always use your most recent med list.          acetaminophen 500 MG tablet Commonly known as:  TYLENOL Take 1,000 mg by mouth every 6 (six) hours as needed for mild pain.   ALPRAZolam 0.5 MG tablet Commonly known as:  XANAX Take 1 tablet (0.5 mg total) by mouth at bedtime.   aspirin 325 MG EC tablet Take 1 tablet (325 mg total) by mouth daily.   calcium citrate 950 MG tablet Commonly known as:  CALCITRATE - dosed in mg elemental calcium Take 0.5 tablets (100 mg of elemental calcium total) by mouth daily.   calcium gluconate 500 MG tablet Take 500 mg by mouth 2 (two) times daily.   cephALEXin 500 MG capsule Commonly known as:  KEFLEX Take 1 capsule (500 mg total) by mouth every 12 (twelve) hours.   docusate sodium 100 MG capsule Commonly known as:  COLACE Take 100 mg by mouth 2 (two) times daily.   folic acid 1 MG tablet Commonly known as:  FOLVITE Take 1 mg by mouth daily.   HYDROcodone-acetaminophen 5-325 MG tablet Commonly known as:  NORCO/VICODIN Take 1 tablet by mouth every 6 (six) hours as needed for severe pain.   mirtazapine  30 MG tablet Commonly known as:  REMERON Take 30 mg by mouth at bedtime.   multivitamin with minerals Tabs tablet Take 1 tablet by mouth daily.   pantoprazole 40 MG tablet Commonly known as:  PROTONIX Take 1 tablet (40 mg total) by mouth 2 (two) times daily.   temazepam 30 MG capsule Commonly known as:  RESTORIL Take 30 mg by mouth at bedtime.   VITAMIN D-3 PO Take 1,000 mg by mouth daily.       Immunizations: Immunization History  Administered Date(s) Administered  . Influenza-Unspecified 04/12/2016  . PPD Test 04/30/2016     Physical  Exam:  Vitals:   05/01/16 1249  BP: 135/75  Pulse: 78  Resp: 18  Temp: 97.6 F (36.4 C)  TempSrc: Oral  SpO2: 97%  Weight: 133 lb (60.3 kg)  Height: 4\' 9"  (1.448 m)   Body mass index is 28.78 kg/m.  General- elderly female, frail, thin built, in no acute distress Head- normocephalic, atraumatic Nose- no maxillary or frontal sinus tenderness, no nasal discharge Throat- moist mucus membrane Eyes- PERRLA, EOMI, no pallor, no icterus Neck- no cervical lymphadenopathy Cardiovascular- irregular heart rate, no murmur, no leg edema Respiratory- bilateral clear to auscultation, no wheeze, no rhonchi, no crackles, no use of accessory muscles Abdomen- bowel sounds present, soft, non tender Musculoskeletal- able to move all 4 extremities, generalized weakness, kyphosis +, arthritis changes to her fingers Neurological- alert and oriented to place only Skin- warm and dry, easy bruising Psychiatry- normal mood and affect    Labs reviewed: Basic Metabolic Panel:  Recent Labs  69/62/95 2018 04/27/16 0507  NA 139 142  K 4.0 4.6  CL 105 103  CO2 23 25  GLUCOSE 140* 98  BUN 29* 28*  CREATININE 1.10* 1.03*  CALCIUM 10.3 10.5*   Liver Function Tests:  Recent Labs  04/26/16 2018 04/27/16 0507  AST 22 22  ALT 17 16  ALKPHOS 78 73  BILITOT 0.3 0.4  PROT 7.2 7.4  ALBUMIN 3.7 3.6   No results for input(s): LIPASE, AMYLASE  in the last 8760 hours. No results for input(s): AMMONIA in the last 8760 hours. CBC:  Recent Labs  04/26/16 2018 04/27/16 0421  WBC 10.0 11.9*  NEUTROABS 7.4  --   HGB 14.1 14.0  HCT 43.4 42.6  MCV 98.2 98.2  PLT 165 173   Cardiac Enzymes: No results for input(s): CKTOTAL, CKMB, CKMBINDEX, TROPONINI in the last 8760 hours. BNP: Invalid input(s): POCBNP CBG:  Recent Labs  04/26/16 1958  GLUCAP 139*    Radiological Exams: Ct Head Wo Contrast  Result Date: 04/26/2016 CLINICAL DATA:  Became dizzy while sitting in a chair in the kitchen, fell and hit her head, abrasions to face, slight neck pain, found on floor at 0900 hours, slurred speech, history coronary artery disease post PTCA, PAF EXAM: CT HEAD WITHOUT CONTRAST CT CERVICAL SPINE WITHOUT CONTRAST TECHNIQUE: Multidetector CT imaging of the head and cervical spine was performed following the standard protocol without intravenous contrast. Multiplanar CT image reconstructions of the cervical spine were also generated. COMPARISON:  None FINDINGS: CT HEAD FINDINGS Brain: Generalized atrophy. Normal ventricular morphology. No midline shift or mass effect. Small vessel chronic ischemic changes of deep cerebral white matter. Artery low-attenuation at the anterior LEFT thalamus likely representing acute infarct. No intracranial hemorrhage, cortical infarct, mass lesion or extra-axial fluid collection. Vascular: Atherosclerotic calcification throughout the carotid siphons and at the vertebral arteries. Skull: Intact Sinuses/Orbits: Clear Other: N/A CT CERVICAL SPINE FINDINGS Alignment: Normal Skull base and vertebrae: Skullbase intact. Vertebral body heights maintained. Multilevel facet degenerative changes. No fracture or bone destruction. Significant degenerative changes and spur formation at the RIGHT C1-C2 articulation. Scattered multilevel neural foraminal narrowing bilaterally by spurs. Soft tissues and spinal canal: Prevertebral soft  tissues normal thickness. Spinal canal patent. Disc levels: Multilevel disc space narrowing and minimal endplate spur formation. Upper chest: Emphysematous but clear Other: Nonspecific tiny BILATERAL thyroid nodules bilaterally. Carotid atherosclerotic calcification bilaterally. IMPRESSION: Atrophy with small vessel chronic ischemic changes of deep cerebral white matter. Poorly defined low-attenuation within the anterior LEFT thalamus most consistent with acute infarct.  No acute intracranial hemorrhage or cortical infarction. Multilevel degenerative disc and facet disease changes of the cervical spine. No acute cervical spine abnormalities. Electronically Signed   By: Ulyses Southward M.D.   On: 04/26/2016 21:07   Ct Cervical Spine Wo Contrast  Result Date: 04/26/2016 CLINICAL DATA:  Became dizzy while sitting in a chair in the kitchen, fell and hit her head, abrasions to face, slight neck pain, found on floor at 0900 hours, slurred speech, history coronary artery disease post PTCA, PAF EXAM: CT HEAD WITHOUT CONTRAST CT CERVICAL SPINE WITHOUT CONTRAST TECHNIQUE: Multidetector CT imaging of the head and cervical spine was performed following the standard protocol without intravenous contrast. Multiplanar CT image reconstructions of the cervical spine were also generated. COMPARISON:  None FINDINGS: CT HEAD FINDINGS Brain: Generalized atrophy. Normal ventricular morphology. No midline shift or mass effect. Small vessel chronic ischemic changes of deep cerebral white matter. Artery low-attenuation at the anterior LEFT thalamus likely representing acute infarct. No intracranial hemorrhage, cortical infarct, mass lesion or extra-axial fluid collection. Vascular: Atherosclerotic calcification throughout the carotid siphons and at the vertebral arteries. Skull: Intact Sinuses/Orbits: Clear Other: N/A CT CERVICAL SPINE FINDINGS Alignment: Normal Skull base and vertebrae: Skullbase intact. Vertebral body heights maintained.  Multilevel facet degenerative changes. No fracture or bone destruction. Significant degenerative changes and spur formation at the RIGHT C1-C2 articulation. Scattered multilevel neural foraminal narrowing bilaterally by spurs. Soft tissues and spinal canal: Prevertebral soft tissues normal thickness. Spinal canal patent. Disc levels: Multilevel disc space narrowing and minimal endplate spur formation. Upper chest: Emphysematous but clear Other: Nonspecific tiny BILATERAL thyroid nodules bilaterally. Carotid atherosclerotic calcification bilaterally. IMPRESSION: Atrophy with small vessel chronic ischemic changes of deep cerebral white matter. Poorly defined low-attenuation within the anterior LEFT thalamus most consistent with acute infarct. No acute intracranial hemorrhage or cortical infarction. Multilevel degenerative disc and facet disease changes of the cervical spine. No acute cervical spine abnormalities. Electronically Signed   By: Ulyses Southward M.D.   On: 04/26/2016 21:07   Mr Brain Wo Contrast  Result Date: 04/27/2016 CLINICAL DATA:  Slurred speech and fall at home. Followup probable LEFT thalamus infarct. History of hypertension, hyperlipidemia. EXAM: MRI HEAD WITHOUT CONTRAST MRA HEAD WITHOUT CONTRAST TECHNIQUE: Multiplanar, multiecho pulse sequences of the brain and surrounding structures were obtained without intravenous contrast. Angiographic images of the head were obtained using MRA technique without contrast. COMPARISON:  CT HEAD April 26, 2016 and MRI of the brain July 29, 2007 FINDINGS: MRI HEAD FINDINGS BRAIN: 9 x 13 mm ovoid focus of reduced diffusion mesial LEFT thalamus with corresponding low ADC values and bright FLAIR T2 hyperintense signal. No susceptibility artifact to suggest hemorrhage. The ventricles and sulci are normal for patient's age. No suspicious parenchymal signal, masses or mass effect. Patchy supratentorial white matter FLAIR T2 hyperintensities. Old small LEFT cerebellar  infarcts. No abnormal extra-axial fluid collections. No extra-axial masses though, contrast enhanced sequences would be more sensitive. VASCULAR: Normal major intracranial vascular flow voids present at skull base. SKULL AND UPPER CERVICAL SPINE: No abnormal sellar expansion. No suspicious calvarial bone marrow signal. Craniocervical junction maintained. SINUSES/ORBITS: Minimal paranasal sinus mucosal thickening. Mild RIGHT mastoid effusion. Status post bilateral ocular lens implants. The included ocular globes and orbital contents are non-suspicious. OTHER: Patient is edentulous. MRA HEAD FINDINGS ANTERIOR CIRCULATION: Normal flow related enhancement of the included cervical, petrous, cavernous and supraclinoid internal carotid arteries. Patent anterior communicating artery. Normal flow related enhancement of the anterior and middle cerebral arteries, including distal segments.  No large vessel occlusion, high-grade stenosis, abnormal luminal irregularity, aneurysm. POSTERIOR CIRCULATION: LEFT vertebral artery is dominant. Proximal basilar artery is patent with normal flow related enhancement. Poor visualization of basilar tip. Normal flow related enhancement of the posterior cerebral arteries. Robust bilateral posterior communicating arteries present, possible fetal origin. No large vessel occlusion, high-grade stenosis, abnormal luminal irregularity, aneurysm. IMPRESSION: MRI HEAD: Acute 9 x 13 mm LEFT thalamus infarct corresponding to CT abnormality. Old small LEFT cerebellar infarcts. Mild chronic small vessel ischemic disease. MRA HEAD: No emergent large vessel occlusion or severe stenosis. Slow flow versus occluded/ developmentally absent basilar tip with complete, robust circle of Willis. Electronically Signed   By: Awilda Metroourtnay  Bloomer M.D.   On: 04/27/2016 06:21   Dg Pelvis Portable  Result Date: 04/27/2016 CLINICAL DATA:  Fall. EXAM: PORTABLE PELVIS 1-2 VIEWS COMPARISON:  05/09/2011 . FINDINGS:  Degenerative changes lumbar spine. Bilateral hip replacements. Diffuse osteopenia. No acute bony abnormality. If symptoms persist full pelvic series can be obtained. IMPRESSION: No acute abnormality. Electronically Signed   By: Maisie Fushomas  Register   On: 04/27/2016 07:34   Mr Maxine GlennMra Head/brain Wo Cm  Result Date: 04/27/2016 CLINICAL DATA:  Slurred speech and fall at home. Followup probable LEFT thalamus infarct. History of hypertension, hyperlipidemia. EXAM: MRI HEAD WITHOUT CONTRAST MRA HEAD WITHOUT CONTRAST TECHNIQUE: Multiplanar, multiecho pulse sequences of the brain and surrounding structures were obtained without intravenous contrast. Angiographic images of the head were obtained using MRA technique without contrast. COMPARISON:  CT HEAD April 26, 2016 and MRI of the brain July 29, 2007 FINDINGS: MRI HEAD FINDINGS BRAIN: 9 x 13 mm ovoid focus of reduced diffusion mesial LEFT thalamus with corresponding low ADC values and bright FLAIR T2 hyperintense signal. No susceptibility artifact to suggest hemorrhage. The ventricles and sulci are normal for patient's age. No suspicious parenchymal signal, masses or mass effect. Patchy supratentorial white matter FLAIR T2 hyperintensities. Old small LEFT cerebellar infarcts. No abnormal extra-axial fluid collections. No extra-axial masses though, contrast enhanced sequences would be more sensitive. VASCULAR: Normal major intracranial vascular flow voids present at skull base. SKULL AND UPPER CERVICAL SPINE: No abnormal sellar expansion. No suspicious calvarial bone marrow signal. Craniocervical junction maintained. SINUSES/ORBITS: Minimal paranasal sinus mucosal thickening. Mild RIGHT mastoid effusion. Status post bilateral ocular lens implants. The included ocular globes and orbital contents are non-suspicious. OTHER: Patient is edentulous. MRA HEAD FINDINGS ANTERIOR CIRCULATION: Normal flow related enhancement of the included cervical, petrous, cavernous and supraclinoid  internal carotid arteries. Patent anterior communicating artery. Normal flow related enhancement of the anterior and middle cerebral arteries, including distal segments. No large vessel occlusion, high-grade stenosis, abnormal luminal irregularity, aneurysm. POSTERIOR CIRCULATION: LEFT vertebral artery is dominant. Proximal basilar artery is patent with normal flow related enhancement. Poor visualization of basilar tip. Normal flow related enhancement of the posterior cerebral arteries. Robust bilateral posterior communicating arteries present, possible fetal origin. No large vessel occlusion, high-grade stenosis, abnormal luminal irregularity, aneurysm. IMPRESSION: MRI HEAD: Acute 9 x 13 mm LEFT thalamus infarct corresponding to CT abnormality. Old small LEFT cerebellar infarcts. Mild chronic small vessel ischemic disease. MRA HEAD: No emergent large vessel occlusion or severe stenosis. Slow flow versus occluded/ developmentally absent basilar tip with complete, robust circle of Willis. Electronically Signed   By: Awilda Metroourtnay  Bloomer M.D.   On: 04/27/2016 06:21    Assessment/Plan  Unsteady gait Post cva, Will have patient work with PT/OT as tolerated to regain strength and restore function.  Fall precautions are in place.  Acute CVA  Had left thalamic stroke. Continue aspirin ec 325 mg daily. Monitor BP. Get SLP to evaluate  Acute cystitis Continue cephalhexin 500 mg q12h until 05/04/16. Encourage hydration  Leukocytosis Afebrile. Currently on antibiotic for UTI. Monitor wbc and temp curve  Protein calorie malnutrition Continue remeron 30 mg daily and MVI. RD to evlauate. Encourage po intake  afib Rate controlled. Not on [facility] with her age and high fall risk  Chronic depression Continue remeron  Insomnia Continue restoril at bedtime  Constipation Continue colace 100 mg bid  gerd Continue protonix  ckd stage 3 Monitor bmp   Goals of care: short term rehabilitation   Labs/tests  ordered: cbc, cmp 05/07/16  Family/ staff Communication: reviewed care plan with patient and nursing supervisor    Oneal Grout, MD Internal Medicine [facility] [address] [place], [place] [address] Cell Phone (Monday-Friday 8 am - 5 pm): [phone] On Call: [phone] and follow prompts after 5 pm and on weekends Office Phone: [phone] Office Fax: [phone]

## 2016-05-07 LAB — CBC AND DIFFERENTIAL
HCT: 43 % (ref 36–46)
HEMOGLOBIN: 14 g/dL (ref 12.0–16.0)
NEUTROS ABS: 8 /uL
PLATELETS: 152 10*3/uL (ref 150–399)
WBC: 10.6 10^3/mL

## 2016-05-07 LAB — BASIC METABOLIC PANEL
BUN: 27 mg/dL — AB (ref 4–21)
Creatinine: 1 mg/dL (ref 0.5–1.1)
Glucose: 121 mg/dL
Potassium: 4.5 mmol/L (ref 3.4–5.3)
SODIUM: 145 mmol/L (ref 137–147)

## 2016-05-07 LAB — HEPATIC FUNCTION PANEL
ALK PHOS: 105 U/L (ref 25–125)
ALT: 19 U/L (ref 7–35)
AST: 21 U/L (ref 13–35)
BILIRUBIN, TOTAL: 0.6 mg/dL

## 2016-05-16 ENCOUNTER — Non-Acute Institutional Stay: Payer: Self-pay | Admitting: Adult Health

## 2016-05-16 ENCOUNTER — Encounter: Payer: Self-pay | Admitting: Adult Health

## 2016-05-16 DIAGNOSIS — K5909 Other constipation: Secondary | ICD-10-CM

## 2016-05-16 DIAGNOSIS — I639 Cerebral infarction, unspecified: Secondary | ICD-10-CM

## 2016-05-16 DIAGNOSIS — I4821 Permanent atrial fibrillation: Secondary | ICD-10-CM

## 2016-05-16 DIAGNOSIS — F419 Anxiety disorder, unspecified: Secondary | ICD-10-CM

## 2016-05-16 DIAGNOSIS — G4700 Insomnia, unspecified: Secondary | ICD-10-CM

## 2016-05-16 DIAGNOSIS — K219 Gastro-esophageal reflux disease without esophagitis: Secondary | ICD-10-CM

## 2016-05-16 DIAGNOSIS — R2681 Unsteadiness on feet: Secondary | ICD-10-CM

## 2016-05-16 DIAGNOSIS — F32A Depression, unspecified: Secondary | ICD-10-CM

## 2016-05-16 DIAGNOSIS — F329 Major depressive disorder, single episode, unspecified: Secondary | ICD-10-CM

## 2016-05-16 NOTE — Progress Notes (Addendum)
Patient ID: Diane MaesLillie K Tyrell, female   DOB: [birth year], 82 y.o.   MRN: [id number]    DATE:  05/16/2016   MRN:  [id number]  BIRTHDAY: [birth year]  Facility:  Nursing Home Location:  [facility]  Nursing Home Room Number: 203-A  LEVEL OF CARE:  SNF (31)  Contact Information    Name Relation Home Work [place]   Ellison,Joyce Daughter [phone]  [phone]       Code Status History    Date Active Date Inactive Code Status Order ID Comments User Context   04/27/2016  2:05 AM 04/30/2016  9:38 PM Full Code [id number]  Eduard ClosArshad N Kakrakandy, MD ED   01/06/2015  5:02 PM 01/08/2015  2:51 PM DNR [id number]  Zannie CovePreetha Joseph, MD Inpatient   12/01/2011  6:59 PM 12/04/2011  6:53 PM Full Code [id number]  Koren BoundSharon Andrea Whitehorn, RN Inpatient   11/25/2011  1:05 PM 12/01/2011  6:59 PM Full Code [id number]  Janey Gentaaiwo O Tijani, RN Inpatient       Chief Complaint  Patient presents with  . Discharge Note    HISTORY OF PRESENT ILLNESS:  This is a 82 year old female who is for discharge home with Home health PT, OT, CNA, Speech Therapy and Nursing. DME:  Standard wheelchair, cushion, leg rests and anti-tippers.  [facility] from [facility] 04/26/16 - 04/30/16. She had acute thalamic stroke with right-sided weakness and UTI. Neurology was consulted and she was started on ASA and plavix was discontinued.  Patient was admitted to this facility for short-term rehabilitation after the patient's recent hospitalization.  Patient has completed SNF rehabilitation and therapy has cleared the patient for discharge.   PAST MEDICAL HISTORY:  Past Medical History:  Diagnosis Date  . Anxiety   . Arthritis    R Hip total Arthroplasty & L Hip Hemiarthroplasty  . CAD S/P percutaneous coronary angioplasty 2004   ZETA BMS 4.0 mm X 15 mm in RCA - 2004; CYPHER DES 2.35mm x 18 mm to LAD (with PTCA of jailed D2 with ostial 85%)& 2 in 2007  . Cataract     removed bil eyes  . Depression   . Diverticulosis   . Gastric outlet obstruction 2012   remote -- s/p Vagotomy & Pyeloroplasty  . GERD (gastroesophageal reflux disease)    TIA  . History of blood transfusion    With Hip Surgery  . Hyperlipidemia   . Hypertension    H/o Renovascular HTN, s/p R RA Stent in 06/2003  . Multiple allergies   . Osteoporosis    s/p L hip Fxr - Hemiarthroplasty  . PAF (paroxysmal atrial fibrillation) [facility](HCC) April 2015   Newly diagnosed by PCP  . PUD (peptic ulcer disease)    Gastric Ulcer  . Renal artery stenosis, native (HCC) 2003-2004   S/P R Renal A Stent 5 mm x 12 mm  . Stroke (HCC) 04/2016  . TIA (transient ischemic attack)      CURRENT MEDICATIONS: Reviewed  Patient's Medications  New Prescriptions   No medications on file  Previous Medications   ACETAMINOPHEN (TYLENOL) 500 MG TABLET    Take 1,000 mg by mouth every 6 (six) hours as needed for mild pain.   ALPRAZOLAM (XANAX) 0.5 MG TABLET    Take 1 tablet (0.5 mg total) by mouth at bedtime.   ASPIRIN EC 325 MG EC TABLET    Take 1 tablet (325 mg total) by mouth daily.   CALCIUM  CITRATE (CALCITRATE - DOSED IN MG ELEMENTAL CALCIUM) 950 MG TABLET    Take 0.5 tablets (100 mg of elemental calcium total) by mouth daily.   CALCIUM GLUCONATE 500 MG TABLET    Take 500 mg by mouth 2 (two) times daily.    CHOLECALCIFEROL (VITAMIN D-3 PO)    Take 1,000 mg by mouth daily.    DOCUSATE SODIUM (COLACE) 100 MG CAPSULE    Take 100 mg by mouth 2 (two) times daily.     FOLIC ACID (FOLVITE) 1 MG TABLET    Take 1 mg by mouth daily.     HYDROCODONE-ACETAMINOPHEN (NORCO/VICODIN) 5-325 MG TABLET    Take 1 tablet by mouth every 6 (six) hours as needed for severe pain.   MIRTAZAPINE (REMERON) 30 MG TABLET    Take 30 mg by mouth at bedtime.    MULTIPLE VITAMIN (MULITIVITAMIN WITH MINERALS) TABS    Take 1 tablet by mouth daily.   PANTOPRAZOLE (PROTONIX) 40 MG TABLET    Take 1 tablet (40 mg total) by mouth 2 (two) times  daily.   TEMAZEPAM (RESTORIL) 30 MG CAPSULE    Take 30 mg by mouth at bedtime.  Modified Medications   No medications on file  Discontinued Medications   No medications on file     No Known Allergies   REVIEW OF SYSTEMS:  GENERAL: no change in appetite, no fatigue, no weight changes, no fever, chills or weakness EYES: Denies change in vision, dry eyes, eye pain, itching or discharge EARS: Denies change in hearing, ringing in ears, or earache NOSE: Denies nasal congestion or epistaxis MOUTH and THROAT: Denies oral discomfort, gingival pain or bleeding, pain from teeth or hoarseness   RESPIRATORY: no cough, SOB, DOE, wheezing, hemoptysis CARDIAC: no chest pain, edema or palpitations GI: no abdominal pain, diarrhea, constipation, heart burn, nausea or vomiting GU: Denies dysuria, frequency, hematuria, incontinence, or discharge PSYCHIATRIC: Denies feeling of depression or anxiety. No report of hallucinations, insomnia, paranoia, or agitation    PHYSICAL EXAMINATION  GENERAL APPEARANCE: Well nourished. In no acute distress. Normal body habitus SKIN:  Skin is warm and dry.  HEAD: Normal in size and contour. No evidence of trauma EYES: Lids open and close normally. No blepharitis, entropion or ectropion. PERRL. Conjunctivae are clear and sclerae are white. Lenses are without opacity EARS: Pinnae are normal. Patient hears normal voice tunes of the examiner MOUTH and THROAT: Lips are without lesions. Oral mucosa is moist and without lesions. Tongue is normal in shape, size, and color and without lesions NECK: supple, trachea midline, no neck masses, no thyroid tenderness, no thyromegaly LYMPHATICS: no LAN in the neck, no supraclavicular LAN RESPIRATORY: breathing is even & unlabored, BS CTAB CARDIAC: RRR, no murmur,no extra heart sounds, no edema GI: abdomen soft, normal BS, no masses, no tenderness, no hepatomegaly, no splenomegaly EXTREMITIES:  Able to move X 4 extremities; has  right -sided weakness PSYCHIATRIC: Alert and oriented to place and person, disoriented to time. Affect and behavior are appropriate  LABS/RADIOLOGY: Labs reviewed: Basic Metabolic Panel:  Recent Labs  98/11/91 2018 04/27/16 0507 05/07/16  NA 139 142 145  K 4.0 4.6 4.5  CL 105 103  --   CO2 23 25  --   GLUCOSE 140* 98  --   BUN 29* 28* 27*  CREATININE 1.10* 1.03* 1.0  CALCIUM 10.3 10.5*  --    Liver Function Tests:  Recent Labs  04/26/16 2018 04/27/16 0507 05/07/16  AST 22 22 21  ALT 17 16 19   ALKPHOS 78 73 105  BILITOT 0.3 0.4  --   PROT 7.2 7.4  --   ALBUMIN 3.7 3.6  --    CBC:  Recent Labs  04/26/16 2018 04/27/16 0421 05/07/16  WBC 10.0 11.9* 10.6  NEUTROABS 7.4  --  8  HGB 14.1 14.0 14.0  HCT 43.4 42.6 43  MCV 98.2 98.2  --   PLT 165 173 152   Lipid Panel:  Recent Labs  04/27/16 0203  HDL 45   CBG:  Recent Labs  04/26/16 1958  GLUCAP 139*      Ct Head Wo Contrast  Result Date: 04/26/2016 CLINICAL DATA:  Became dizzy while sitting in a chair in the kitchen, fell and hit her head, abrasions to face, slight neck pain, found on floor at 0900 hours, slurred speech, history coronary artery disease post PTCA, PAF EXAM: CT HEAD WITHOUT CONTRAST CT CERVICAL SPINE WITHOUT CONTRAST TECHNIQUE: Multidetector CT imaging of the head and cervical spine was performed following the standard protocol without intravenous contrast. Multiplanar CT image reconstructions of the cervical spine were also generated. COMPARISON:  None FINDINGS: CT HEAD FINDINGS Brain: Generalized atrophy. Normal ventricular morphology. No midline shift or mass effect. Small vessel chronic ischemic changes of deep cerebral white matter. Artery low-attenuation at the anterior LEFT thalamus likely representing acute infarct. No intracranial hemorrhage, cortical infarct, mass lesion or extra-axial fluid collection. Vascular: Atherosclerotic calcification throughout the carotid siphons and at the  vertebral arteries. Skull: Intact Sinuses/Orbits: Clear Other: N/A CT CERVICAL SPINE FINDINGS Alignment: Normal Skull base and vertebrae: Skullbase intact. Vertebral body heights maintained. Multilevel facet degenerative changes. No fracture or bone destruction. Significant degenerative changes and spur formation at the RIGHT C1-C2 articulation. Scattered multilevel neural foraminal narrowing bilaterally by spurs. Soft tissues and spinal canal: Prevertebral soft tissues normal thickness. Spinal canal patent. Disc levels: Multilevel disc space narrowing and minimal endplate spur formation. Upper chest: Emphysematous but clear Other: Nonspecific tiny BILATERAL thyroid nodules bilaterally. Carotid atherosclerotic calcification bilaterally. IMPRESSION: Atrophy with small vessel chronic ischemic changes of deep cerebral white matter. Poorly defined low-attenuation within the anterior LEFT thalamus most consistent with acute infarct. No acute intracranial hemorrhage or cortical infarction. Multilevel degenerative disc and facet disease changes of the cervical spine. No acute cervical spine abnormalities. Electronically Signed   By: Ulyses Southward M.D.   On: 04/26/2016 21:07   Ct Cervical Spine Wo Contrast  Result Date: 04/26/2016 CLINICAL DATA:  Became dizzy while sitting in a chair in the kitchen, fell and hit her head, abrasions to face, slight neck pain, found on floor at 0900 hours, slurred speech, history coronary artery disease post PTCA, PAF EXAM: CT HEAD WITHOUT CONTRAST CT CERVICAL SPINE WITHOUT CONTRAST TECHNIQUE: Multidetector CT imaging of the head and cervical spine was performed following the standard protocol without intravenous contrast. Multiplanar CT image reconstructions of the cervical spine were also generated. COMPARISON:  None FINDINGS: CT HEAD FINDINGS Brain: Generalized atrophy. Normal ventricular morphology. No midline shift or mass effect. Small vessel chronic ischemic changes of deep cerebral  white matter. Artery low-attenuation at the anterior LEFT thalamus likely representing acute infarct. No intracranial hemorrhage, cortical infarct, mass lesion or extra-axial fluid collection. Vascular: Atherosclerotic calcification throughout the carotid siphons and at the vertebral arteries. Skull: Intact Sinuses/Orbits: Clear Other: N/A CT CERVICAL SPINE FINDINGS Alignment: Normal Skull base and vertebrae: Skullbase intact. Vertebral body heights maintained. Multilevel facet degenerative changes. No fracture or bone destruction. Significant degenerative changes and spur  formation at the RIGHT C1-C2 articulation. Scattered multilevel neural foraminal narrowing bilaterally by spurs. Soft tissues and spinal canal: Prevertebral soft tissues normal thickness. Spinal canal patent. Disc levels: Multilevel disc space narrowing and minimal endplate spur formation. Upper chest: Emphysematous but clear Other: Nonspecific tiny BILATERAL thyroid nodules bilaterally. Carotid atherosclerotic calcification bilaterally. IMPRESSION: Atrophy with small vessel chronic ischemic changes of deep cerebral white matter. Poorly defined low-attenuation within the anterior LEFT thalamus most consistent with acute infarct. No acute intracranial hemorrhage or cortical infarction. Multilevel degenerative disc and facet disease changes of the cervical spine. No acute cervical spine abnormalities. Electronically Signed   By: Ulyses Southward M.D.   On: 04/26/2016 21:07   Mr Brain Wo Contrast  Result Date: 04/27/2016 CLINICAL DATA:  Slurred speech and fall at home. Followup probable LEFT thalamus infarct. History of hypertension, hyperlipidemia. EXAM: MRI HEAD WITHOUT CONTRAST MRA HEAD WITHOUT CONTRAST TECHNIQUE: Multiplanar, multiecho pulse sequences of the brain and surrounding structures were obtained without intravenous contrast. Angiographic images of the head were obtained using MRA technique without contrast. COMPARISON:  CT HEAD April 26, 2016 and MRI of the brain July 29, 2007 FINDINGS: MRI HEAD FINDINGS BRAIN: 9 x 13 mm ovoid focus of reduced diffusion mesial LEFT thalamus with corresponding low ADC values and bright FLAIR T2 hyperintense signal. No susceptibility artifact to suggest hemorrhage. The ventricles and sulci are normal for patient's age. No suspicious parenchymal signal, masses or mass effect. Patchy supratentorial white matter FLAIR T2 hyperintensities. Old small LEFT cerebellar infarcts. No abnormal extra-axial fluid collections. No extra-axial masses though, contrast enhanced sequences would be more sensitive. VASCULAR: Normal major intracranial vascular flow voids present at skull base. SKULL AND UPPER CERVICAL SPINE: No abnormal sellar expansion. No suspicious calvarial bone marrow signal. Craniocervical junction maintained. SINUSES/ORBITS: Minimal paranasal sinus mucosal thickening. Mild RIGHT mastoid effusion. Status post bilateral ocular lens implants. The included ocular globes and orbital contents are non-suspicious. OTHER: Patient is edentulous. MRA HEAD FINDINGS ANTERIOR CIRCULATION: Normal flow related enhancement of the included cervical, petrous, cavernous and supraclinoid internal carotid arteries. Patent anterior communicating artery. Normal flow related enhancement of the anterior and middle cerebral arteries, including distal segments. No large vessel occlusion, high-grade stenosis, abnormal luminal irregularity, aneurysm. POSTERIOR CIRCULATION: LEFT vertebral artery is dominant. Proximal basilar artery is patent with normal flow related enhancement. Poor visualization of basilar tip. Normal flow related enhancement of the posterior cerebral arteries. Robust bilateral posterior communicating arteries present, possible fetal origin. No large vessel occlusion, high-grade stenosis, abnormal luminal irregularity, aneurysm. IMPRESSION: MRI HEAD: Acute 9 x 13 mm LEFT thalamus infarct corresponding to CT abnormality. Old  small LEFT cerebellar infarcts. Mild chronic small vessel ischemic disease. MRA HEAD: No emergent large vessel occlusion or severe stenosis. Slow flow versus occluded/ developmentally absent basilar tip with complete, robust circle of Willis. Electronically Signed   By: Awilda Metro M.D.   On: 04/27/2016 06:21   Dg Pelvis Portable  Result Date: 04/27/2016 CLINICAL DATA:  Fall. EXAM: PORTABLE PELVIS 1-2 VIEWS COMPARISON:  05/09/2011 . FINDINGS: Degenerative changes lumbar spine. Bilateral hip replacements. Diffuse osteopenia. No acute bony abnormality. If symptoms persist full pelvic series can be obtained. IMPRESSION: No acute abnormality. Electronically Signed   By: Maisie Fus  Register   On: 04/27/2016 07:34   Mr Maxine Glenn Head/brain Wo Cm  Result Date: 04/27/2016 CLINICAL DATA:  Slurred speech and fall at home. Followup probable LEFT thalamus infarct. History of hypertension, hyperlipidemia. EXAM: MRI HEAD WITHOUT CONTRAST MRA HEAD WITHOUT CONTRAST TECHNIQUE: Multiplanar,  multiecho pulse sequences of the brain and surrounding structures were obtained without intravenous contrast. Angiographic images of the head were obtained using MRA technique without contrast. COMPARISON:  CT HEAD April 26, 2016 and MRI of the brain July 29, 2007 FINDINGS: MRI HEAD FINDINGS BRAIN: 9 x 13 mm ovoid focus of reduced diffusion mesial LEFT thalamus with corresponding low ADC values and bright FLAIR T2 hyperintense signal. No susceptibility artifact to suggest hemorrhage. The ventricles and sulci are normal for patient's age. No suspicious parenchymal signal, masses or mass effect. Patchy supratentorial white matter FLAIR T2 hyperintensities. Old small LEFT cerebellar infarcts. No abnormal extra-axial fluid collections. No extra-axial masses though, contrast enhanced sequences would be more sensitive. VASCULAR: Normal major intracranial vascular flow voids present at skull base. SKULL AND UPPER CERVICAL SPINE: No abnormal  sellar expansion. No suspicious calvarial bone marrow signal. Craniocervical junction maintained. SINUSES/ORBITS: Minimal paranasal sinus mucosal thickening. Mild RIGHT mastoid effusion. Status post bilateral ocular lens implants. The included ocular globes and orbital contents are non-suspicious. OTHER: Patient is edentulous. MRA HEAD FINDINGS ANTERIOR CIRCULATION: Normal flow related enhancement of the included cervical, petrous, cavernous and supraclinoid internal carotid arteries. Patent anterior communicating artery. Normal flow related enhancement of the anterior and middle cerebral arteries, including distal segments. No large vessel occlusion, high-grade stenosis, abnormal luminal irregularity, aneurysm. POSTERIOR CIRCULATION: LEFT vertebral artery is dominant. Proximal basilar artery is patent with normal flow related enhancement. Poor visualization of basilar tip. Normal flow related enhancement of the posterior cerebral arteries. Robust bilateral posterior communicating arteries present, possible fetal origin. No large vessel occlusion, high-grade stenosis, abnormal luminal irregularity, aneurysm. IMPRESSION: MRI HEAD: Acute 9 x 13 mm LEFT thalamus infarct corresponding to CT abnormality. Old small LEFT cerebellar infarcts. Mild chronic small vessel ischemic disease. MRA HEAD: No emergent large vessel occlusion or severe stenosis. Slow flow versus occluded/ developmentally absent basilar tip with complete, robust circle of Willis. Electronically Signed   By: Awilda Metro M.D.   On: 04/27/2016 06:21    ASSESSMENT/PLAN:  Unsteady gait - for Home health PT, OT, CNA, Speech Therapy and Nursing, for therapeutic strengthening exercises; fall precaution  Acute CVA - had left thalamic stroke ; continue ASA EC 325 mg 1 tab PO Q D  Acute cystitis - S/P Cephalexin course; resolved  Leukocytosis - resolved Lab Results  Component Value Date   WBC 10.6 05/07/2016   Atrial Fibrillation -  rate-controlled ;   Chronic Depression - continue Remeron 30 mg 1 tab PO Q HS  Constipation - continue Colace 100 mg 1 capsule PO BID  GERD - continue Protonix 40 mg 1 tab PO BID  Anxiety - mood is stable; continue Xanax 0.5 mg 1 tab PO Q HS  Insomnia - continue Restoril 30 mg 1 capsule PO Q HS      I have filled out patient's discharge paperwork and written prescriptions.  Patient will receive home health PT, OT, ST, nursing and CNA.  DME provided:  Standard wheelchair, cushion, leg rests, anti-tippers, bedside commode  Total discharge time: Greater than 30 minutes Greater than 50% was spent in counseling and coordination of care with the patient.  Discharge time involved coordination of the discharge process with social worker, nursing staff and therapy department. Medical justification for home health services/DME verified.     Kenard Gower, NP [facility] [phone]   This encounter was created in error - please disregard.

## 2016-06-01 ENCOUNTER — Encounter: Payer: Self-pay | Admitting: Adult Health

## 2016-06-01 ENCOUNTER — Non-Acute Institutional Stay (SKILLED_NURSING_FACILITY): Payer: Commercial Managed Care - HMO | Admitting: Adult Health

## 2016-06-01 DIAGNOSIS — R2681 Unsteadiness on feet: Secondary | ICD-10-CM | POA: Diagnosis not present

## 2016-06-01 DIAGNOSIS — K219 Gastro-esophageal reflux disease without esophagitis: Secondary | ICD-10-CM

## 2016-06-01 DIAGNOSIS — I482 Chronic atrial fibrillation: Secondary | ICD-10-CM

## 2016-06-01 DIAGNOSIS — F32A Depression, unspecified: Secondary | ICD-10-CM

## 2016-06-01 DIAGNOSIS — F419 Anxiety disorder, unspecified: Secondary | ICD-10-CM

## 2016-06-01 DIAGNOSIS — I4821 Permanent atrial fibrillation: Secondary | ICD-10-CM

## 2016-06-01 DIAGNOSIS — G4700 Insomnia, unspecified: Secondary | ICD-10-CM

## 2016-06-01 DIAGNOSIS — F329 Major depressive disorder, single episode, unspecified: Secondary | ICD-10-CM

## 2016-06-01 DIAGNOSIS — K5909 Other constipation: Secondary | ICD-10-CM

## 2016-06-01 DIAGNOSIS — I639 Cerebral infarction, unspecified: Secondary | ICD-10-CM

## 2016-06-01 NOTE — Progress Notes (Signed)
Patient ID: [name], female   DOB: 07/19/1918, 82 y.o.   MRN: [id number]    DATE:  06/01/2016   MRN:  [id number]  BIRTHDAY: May 31, 1918  Facility:  Nursing Home Location:  [facility]  Nursing Home Room Number: 203-A  LEVEL OF CARE:  SNF (31)  Contact Information    Name Relation Home Work [place] Daughter [phone]  [phone]       Code Status History    Date Active Date Inactive Code Status Order ID Comments User Context   04/27/2016  2:05 AM 04/30/2016  9:38 PM Full Code [id number]  Eduard Clos, MD ED   01/06/2015  5:02 PM 01/08/2015  2:51 PM DNR [id number]  Zannie Cove, MD Inpatient   12/01/2011  6:59 PM 12/04/2011  6:53 PM Full Code [id number]  Koren Bound, RN Inpatient   11/25/2011  1:05 PM 12/01/2011  6:59 PM Full Code [id number]  Janey Genta, RN Inpatient       Chief Complaint  Patient presents with  . Medical Management of Chronic Issues    HISTORY OF PRESENT ILLNESS:  This is a 82 year old female who is being seen for a routine visit. She was suppose to be discharged home last month but ended up staying and is for possible long-term care. She is currently having Speech therapy and OT.  She has been admitted to [facility] on 04/30/16 from [facility] hospitalization 04/26/16 - 04/30/16. She had acute thalamic stroke with right-sided weakness and UTI. Neurology was consulted and she was started on ASA and plavix was discontinued.   PAST MEDICAL HISTORY:  Past Medical History:  Diagnosis Date  . Anxiety   . Arthritis    R Hip total Arthroplasty & L Hip Hemiarthroplasty  . CAD S/P percutaneous coronary angioplasty 2004   ZETA BMS 4.0 mm X 15 mm in RCA - 2004; CYPHER DES 2.44mm x 18 mm to LAD (with PTCA of jailed D2 with ostial 85%)& 2 in 2007  . Cataract    removed bil eyes  . Depression   . Diverticulosis   . Gastric outlet obstruction 2012   remote -- s/p Vagotomy & Pyeloroplasty  . GERD  (gastroesophageal reflux disease)    TIA  . History of blood transfusion    With Hip Surgery  . Hyperlipidemia   . Hypertension    H/o Renovascular HTN, s/p R RA Stent in 06/2003  . Multiple allergies   . Osteoporosis    s/p L hip Fxr - Hemiarthroplasty  . PAF (paroxysmal atrial fibrillation) [facility]) April 2015   Newly diagnosed by PCP  . PUD (peptic ulcer disease)    Gastric Ulcer  . Renal artery stenosis, native (HCC) 2003-2004   S/P R Renal A Stent 5 mm x 12 mm  . Stroke (HCC) 04/2016  . TIA (transient ischemic attack)      CURRENT MEDICATIONS: Reviewed  Patient's Medications  New Prescriptions   No medications on file  Previous Medications   ACETAMINOPHEN (TYLENOL) 500 MG TABLET    Take 1,000 mg by mouth every 6 (six) hours as needed for mild pain.   ALPRAZOLAM (XANAX) 0.5 MG TABLET    Take 1 tablet (0.5 mg total) by mouth at bedtime.   ASPIRIN EC 325 MG EC TABLET    Take 1 tablet (325 mg total) by mouth daily.   CALCIUM CITRATE (CALCITRATE - DOSED IN MG ELEMENTAL CALCIUM) 950 MG TABLET    Take  0.5 tablets (100 mg of elemental calcium total) by mouth daily.   CALCIUM GLUCONATE 500 MG TABLET    Take 500 mg by mouth 2 (two) times daily.    CHOLECALCIFEROL (VITAMIN D-3 PO)    Take 1,000 mg by mouth daily.    DOCUSATE SODIUM (COLACE) 100 MG CAPSULE    Take 100 mg by mouth 2 (two) times daily.     FOLIC ACID (FOLVITE) 1 MG TABLET    Take 1 mg by mouth daily.     HYDROCODONE-ACETAMINOPHEN (NORCO/VICODIN) 5-325 MG TABLET    Take 1 tablet by mouth every 6 (six) hours as needed for severe pain.   MENTHOL, TOPICAL ANALGESIC, (BIOFREEZE) 4 % GEL    Apply 1 application topically 3 (three) times daily. Apply to right arm every shift   MIRTAZAPINE (REMERON) 30 MG TABLET    Take 30 mg by mouth at bedtime.    MULTIPLE VITAMIN (MULITIVITAMIN WITH MINERALS) TABS    Take 1 tablet by mouth daily.   PANTOPRAZOLE (PROTONIX) 40 MG TABLET    Take 1 tablet (40 mg total) by mouth 2 (two) times daily.    TEMAZEPAM (RESTORIL) 30 MG CAPSULE    Take 30 mg by mouth at bedtime.  Modified Medications   No medications on file  Discontinued Medications   No medications on file     No Known Allergies   REVIEW OF SYSTEMS:  GENERAL: no change in appetite, no fatigue, no weight changes, no fever, chills or weakness EYES: Denies change in vision, dry eyes, eye pain, itching or discharge EARS: Denies change in hearing, ringing in ears, or earache NOSE: Denies nasal congestion or epistaxis MOUTH and THROAT: Denies oral discomfort, gingival pain or bleeding, pain from teeth or hoarseness   RESPIRATORY: no cough, SOB, DOE, wheezing, hemoptysis CARDIAC: no chest pain, edema or palpitations GI: no abdominal pain, diarrhea, constipation, heart burn, nausea or vomiting GU: Denies dysuria, frequency, hematuria, incontinence, or discharge PSYCHIATRIC: Denies feeling of depression or anxiety. No report of hallucinations, insomnia, paranoia, or agitation    PHYSICAL EXAMINATION  GENERAL APPEARANCE: Well nourished. In no acute distress. Normal body habitus SKIN:  Skin is warm and dry.  HEAD: Normal in size and contour. No evidence of trauma EYES: Lids open and close normally. No blepharitis, entropion or ectropion. PERRL. Conjunctivae are clear and sclerae are white. Lenses are without opacity EARS: Pinnae are normal. Patient hears normal voice tunes of the examiner MOUTH and THROAT: Lips are without lesions. Oral mucosa is moist and without lesions. Tongue is normal in shape, size, and color and without lesions NECK: supple, trachea midline, no neck masses, no thyroid tenderness, no thyromegaly LYMPHATICS: no LAN in the neck, no supraclavicular LAN RESPIRATORY: breathing is even & unlabored, BS CTAB CARDIAC: RRR, no murmur,no extra heart sounds, no edema GI: abdomen soft, normal BS, no masses, no tenderness, no hepatomegaly, no splenomegaly EXTREMITIES:  Able to move X 4 extremities; has right  -sided weakness PSYCHIATRIC: Alert and oriented to place and person, disoriented to time. Affect and behavior are appropriate  LABS/RADIOLOGY: Labs reviewed: Basic Metabolic Panel:  Recent Labs  16/04/9609/05/17 2018 04/27/16 0507 05/07/16  NA 139 142 145  K 4.0 4.6 4.5  CL 105 103  --   CO2 23 25  --   GLUCOSE 140* 98  --   BUN 29* 28* 27*  CREATININE 1.10* 1.03* 1.0  CALCIUM 10.3 10.5*  --    Liver Function Tests:  Recent Labs  04/26/16 2018 04/27/16 0507 05/07/16  AST 22 22 21   ALT 17 16 19   ALKPHOS 78 73 105  BILITOT 0.3 0.4  --   PROT 7.2 7.4  --   ALBUMIN 3.7 3.6  --    CBC:  Recent Labs  04/26/16 2018 04/27/16 0421 05/07/16  WBC 10.0 11.9* 10.6  NEUTROABS 7.4  --  8  HGB 14.1 14.0 14.0  HCT 43.4 42.6 43  MCV 98.2 98.2  --   PLT 165 173 152   Lipid Panel:  Recent Labs  04/27/16 0203  HDL 45   CBG:  Recent Labs  04/26/16 1958  GLUCAP 139*       ASSESSMENT/PLAN:  Unsteady gait - continue rehabilitation,  PT and OT , for therapeutic strengthening exercises; fall precaution  Acute CVA - had left thalamic stroke ; continue ASA EC 325 mg 1 tab PO Q D  Atrial Fibrillation - rate-controlled   Chronic Depression - continue Remeron 30 mg 1 tab PO Q HS  Constipation - continue Colace 100 mg 1 capsule PO BID  GERD - continue Protonix 40 mg 1 tab PO BID  Anxiety - mood is stable; continue Xanax 0.5 mg 1 tab PO Q HS  Insomnia - continue Restoril 30 mg 1 capsule PO Q HS    Goals of care:  Short-term rehabilitation    Kenard GowerMonina Medina-Vargas, NP [facility] [phone]

## 2016-06-01 NOTE — Addendum Note (Signed)
Addended by: Kenard GowerMEDINA-VARGAS, MONINA C on: 06/01/2016 04:07 PM   Modules accepted: Kipp BroodSmartSet

## 2016-06-09 ENCOUNTER — Emergency Department (HOSPITAL_COMMUNITY): Payer: Commercial Managed Care - HMO

## 2016-06-09 ENCOUNTER — Emergency Department (HOSPITAL_COMMUNITY)
Admission: EM | Admit: 2016-06-09 | Discharge: 2016-06-22 | Disposition: E | Payer: Commercial Managed Care - HMO | Attending: Emergency Medicine | Admitting: Emergency Medicine

## 2016-06-09 ENCOUNTER — Encounter (HOSPITAL_COMMUNITY): Payer: Self-pay | Admitting: *Deleted

## 2016-06-09 DIAGNOSIS — I10 Essential (primary) hypertension: Secondary | ICD-10-CM | POA: Diagnosis present

## 2016-06-09 DIAGNOSIS — I701 Atherosclerosis of renal artery: Secondary | ICD-10-CM | POA: Diagnosis not present

## 2016-06-09 DIAGNOSIS — R634 Abnormal weight loss: Secondary | ICD-10-CM | POA: Diagnosis present

## 2016-06-09 DIAGNOSIS — I4821 Permanent atrial fibrillation: Secondary | ICD-10-CM | POA: Diagnosis present

## 2016-06-09 DIAGNOSIS — R4182 Altered mental status, unspecified: Secondary | ICD-10-CM | POA: Diagnosis present

## 2016-06-09 DIAGNOSIS — Z8673 Personal history of transient ischemic attack (TIA), and cerebral infarction without residual deficits: Secondary | ICD-10-CM | POA: Diagnosis not present

## 2016-06-09 DIAGNOSIS — I5032 Chronic diastolic (congestive) heart failure: Secondary | ICD-10-CM | POA: Insufficient documentation

## 2016-06-09 DIAGNOSIS — R0609 Other forms of dyspnea: Secondary | ICD-10-CM

## 2016-06-09 DIAGNOSIS — R109 Unspecified abdominal pain: Secondary | ICD-10-CM | POA: Diagnosis not present

## 2016-06-09 DIAGNOSIS — I2510 Atherosclerotic heart disease of native coronary artery without angina pectoris: Secondary | ICD-10-CM | POA: Diagnosis not present

## 2016-06-09 DIAGNOSIS — K279 Peptic ulcer, site unspecified, unspecified as acute or chronic, without hemorrhage or perforation: Secondary | ICD-10-CM | POA: Diagnosis present

## 2016-06-09 DIAGNOSIS — I130 Hypertensive heart and chronic kidney disease with heart failure and stage 1 through stage 4 chronic kidney disease, or unspecified chronic kidney disease: Secondary | ICD-10-CM | POA: Insufficient documentation

## 2016-06-09 DIAGNOSIS — E785 Hyperlipidemia, unspecified: Secondary | ICD-10-CM | POA: Diagnosis present

## 2016-06-09 DIAGNOSIS — I739 Peripheral vascular disease, unspecified: Secondary | ICD-10-CM | POA: Diagnosis present

## 2016-06-09 DIAGNOSIS — N189 Chronic kidney disease, unspecified: Secondary | ICD-10-CM | POA: Diagnosis present

## 2016-06-09 DIAGNOSIS — K259 Gastric ulcer, unspecified as acute or chronic, without hemorrhage or perforation: Secondary | ICD-10-CM | POA: Diagnosis present

## 2016-06-09 DIAGNOSIS — T39395A Adverse effect of other nonsteroidal anti-inflammatory drugs [NSAID], initial encounter: Secondary | ICD-10-CM

## 2016-06-09 DIAGNOSIS — N183 Chronic kidney disease, stage 3 (moderate): Secondary | ICD-10-CM | POA: Insufficient documentation

## 2016-06-09 DIAGNOSIS — K222 Esophageal obstruction: Secondary | ICD-10-CM

## 2016-06-09 DIAGNOSIS — K311 Adult hypertrophic pyloric stenosis: Secondary | ICD-10-CM | POA: Diagnosis not present

## 2016-06-09 DIAGNOSIS — Z9861 Coronary angioplasty status: Secondary | ICD-10-CM

## 2016-06-09 DIAGNOSIS — K219 Gastro-esophageal reflux disease without esophagitis: Secondary | ICD-10-CM | POA: Diagnosis present

## 2016-06-09 DIAGNOSIS — I639 Cerebral infarction, unspecified: Secondary | ICD-10-CM | POA: Diagnosis present

## 2016-06-09 DIAGNOSIS — Z96642 Presence of left artificial hip joint: Secondary | ICD-10-CM | POA: Insufficient documentation

## 2016-06-09 DIAGNOSIS — A419 Sepsis, unspecified organism: Secondary | ICD-10-CM | POA: Diagnosis not present

## 2016-06-09 LAB — URINALYSIS, ROUTINE W REFLEX MICROSCOPIC
Bilirubin Urine: NEGATIVE
GLUCOSE, UA: NEGATIVE mg/dL
Ketones, ur: NEGATIVE mg/dL
Nitrite: NEGATIVE
PH: 5 (ref 5.0–8.0)
PROTEIN: 30 mg/dL — AB
SPECIFIC GRAVITY, URINE: 1.015 (ref 1.005–1.030)

## 2016-06-09 LAB — COMPREHENSIVE METABOLIC PANEL
ALBUMIN: 3 g/dL — AB (ref 3.5–5.0)
AST: 103 U/L — AB (ref 15–41)
Alkaline Phosphatase: 86 U/L (ref 38–126)
Anion gap: 21 — ABNORMAL HIGH (ref 5–15)
BILIRUBIN TOTAL: 0.9 mg/dL (ref 0.3–1.2)
BUN: 32 mg/dL — AB (ref 6–20)
CHLORIDE: 110 mmol/L (ref 101–111)
CO2: 12 mmol/L — ABNORMAL LOW (ref 22–32)
CREATININE: 1.92 mg/dL — AB (ref 0.44–1.00)
Calcium: 9.8 mg/dL (ref 8.9–10.3)
GFR calc Af Amer: 24 mL/min — ABNORMAL LOW (ref >60)
GFR, EST NON AFRICAN AMERICAN: 21 mL/min — AB (ref >60)
GLUCOSE: 280 mg/dL — AB (ref 65–99)
POTASSIUM: 4 mmol/L (ref 3.5–5.1)
Sodium: 143 mmol/L (ref 135–145)
Total Protein: 6.3 g/dL — ABNORMAL LOW (ref 6.5–8.1)

## 2016-06-09 LAB — CBC WITH DIFFERENTIAL/PLATELET
BASOS PCT: 0 %
Basophils Absolute: 0 10*3/uL (ref 0.0–0.1)
EOS PCT: 0 %
Eosinophils Absolute: 0 10*3/uL (ref 0.0–0.7)
HEMATOCRIT: 43.3 % (ref 36.0–46.0)
HEMOGLOBIN: 13.5 g/dL (ref 12.0–15.0)
LYMPHS PCT: 9 %
Lymphs Abs: 1 10*3/uL (ref 0.7–4.0)
MCH: 31.9 pg (ref 26.0–34.0)
MCHC: 31.2 g/dL (ref 30.0–36.0)
MCV: 102.4 fL — AB (ref 78.0–100.0)
Monocytes Absolute: 0.5 10*3/uL (ref 0.1–1.0)
Monocytes Relative: 4 %
NEUTROS ABS: 9.9 10*3/uL — AB (ref 1.7–7.7)
NEUTROS PCT: 87 %
Platelets: 180 10*3/uL (ref 150–400)
RBC: 4.23 MIL/uL (ref 3.87–5.11)
RDW: 13.7 % (ref 11.5–15.5)
WBC: 11.4 10*3/uL — ABNORMAL HIGH (ref 4.0–10.5)

## 2016-06-09 LAB — I-STAT TROPONIN, ED: TROPONIN I, POC: 0.09 ng/mL — AB (ref 0.00–0.08)

## 2016-06-09 LAB — URINE MICROSCOPIC-ADD ON: RBC / HPF: NONE SEEN RBC/hpf (ref 0–5)

## 2016-06-09 LAB — I-STAT CG4 LACTIC ACID, ED
LACTIC ACID, VENOUS: 13.86 mmol/L — AB (ref 0.5–1.9)
LACTIC ACID, VENOUS: 10.48 mmol/L — AB (ref 0.5–1.9)

## 2016-06-09 LAB — POC OCCULT BLOOD, ED: FECAL OCCULT BLD: POSITIVE — AB

## 2016-06-09 MED ORDER — SODIUM CHLORIDE 0.9 % IV SOLN
Freq: Once | INTRAVENOUS | Status: AC
  Administered 2016-06-09: 12:00:00 via INTRAVENOUS

## 2016-06-09 MED ORDER — VANCOMYCIN HCL IN DEXTROSE 1-5 GM/200ML-% IV SOLN
1000.0000 mg | Freq: Once | INTRAVENOUS | Status: AC
  Administered 2016-06-09: 1000 mg via INTRAVENOUS
  Filled 2016-06-09: qty 200

## 2016-06-09 MED ORDER — VANCOMYCIN HCL 500 MG IV SOLR: 500.0000 mg | INTRAVENOUS | Status: DC

## 2016-06-09 MED ORDER — SODIUM CHLORIDE 0.9 % IV BOLUS (SEPSIS)
1000.0000 mL | Freq: Once | INTRAVENOUS | Status: AC
  Administered 2016-06-09: 1000 mL via INTRAVENOUS

## 2016-06-09 MED ORDER — SODIUM CHLORIDE 0.9 % IV BOLUS (SEPSIS): 500.0000 mL | Freq: Once | INTRAVENOUS | Status: DC

## 2016-06-09 MED ORDER — SODIUM CHLORIDE 0.9 % IV BOLUS (SEPSIS)
1000.0000 mL | Freq: Once | INTRAVENOUS | Status: AC
Start: 2016-06-09 — End: 2016-06-09
  Administered 2016-06-09: 1000 mL via INTRAVENOUS

## 2016-06-09 MED ORDER — VANCOMYCIN HCL IN DEXTROSE 750-5 MG/150ML-% IV SOLN: 750.0000 mg | INTRAVENOUS | Status: DC

## 2016-06-09 MED ORDER — PIPERACILLIN-TAZOBACTAM 3.375 G IVPB: 3.3750 g | Freq: Three times a day (TID) | INTRAVENOUS | Status: DC

## 2016-06-09 MED ORDER — PIPERACILLIN-TAZOBACTAM 3.375 G IVPB 30 MIN
3.3750 g | Freq: Once | INTRAVENOUS | Status: AC
Start: 2016-06-09 — End: 2016-06-09
  Administered 2016-06-09: 3.375 g via INTRAVENOUS
  Filled 2016-06-09: qty 50

## 2016-06-13 LAB — URINE CULTURE

## 2016-06-14 LAB — CULTURE, BLOOD (ROUTINE X 2)
CULTURE: NO GROWTH
Culture: NO GROWTH

## 2016-06-22 NOTE — Consult Note (Signed)
Name: ANJU SERENO MRN: [id number] DOB: 06-01-1918    ADMISSION DATE:  06-28-16 CONSULTATION DATE:  06/09/17  REFERRING MD :  EDP --Dr. Adrian Blackwater   CHIEF COMPLAINT:  Sepsis   BRIEF PATIENT DESCRIPTION: 82 yo female from [facility] brought to ER after near syncopal episode w/ 3 day prodrome of n/v with coffee ground emesis and dark stools with suspected aspiration PNA and Sepsis .   SIGNIFICANT EVENTS    STUDIES:  CT abd/pelvis 2016-06-28 >Bilateral lower lobe pneumonia. Nonspecific mildly distended fluid-filled stomach and colon. This is compatible with at least a diarrheal state but no definite focal bowel wall thickening identified.Small amount of nonspecific ascites adjacent to the liver and within the pelvis.  HISTORY OF PRESENT ILLNESS:    82 yo female from NH brought to ER from NH after near syncopal episode w/ 3 day prodrome of n/v with coffee ground emesis and dark stools with suspected aspiration PNA and Sepsis . CT abd and pelvis with distention and fluid fill colon ? Ischemia bowel vs gastroenteritis . Pt was started on IV fluid resusciation w/ 2.5 L of IV NS infused . B/p is improving , and now in mid 80s. She remains on BIPAP , does respond to simple commands. WBC is elevated at 11.4 . Lactic acid is very high at 13.86. Abd is distended .  Her family was updated at bedside. Daughter says she would not want to be tortured and would like her to be a DNR.      PAST MEDICAL HISTORY :   has a past medical history of Anxiety; Arthritis; CAD S/P percutaneous coronary angioplasty (2004); Cataract; Depression; Diverticulosis; Gastric outlet obstruction (2012); GERD (gastroesophageal reflux disease); History of blood transfusion; Hyperlipidemia; Hypertension; Multiple allergies; Osteoporosis; PAF (paroxysmal atrial fibrillation) [facility]) (April 2015); PUD (peptic ulcer disease); Renal artery stenosis, native [facility]) (2003-2004); Stroke [facility]) (04/2016); and TIA (transient ischemic attack).  has a past surgical history that includes Vagotomy and pyloroplasty (01/13/04); Replacement total hip w/  resurfacing implants (Right, 04/2002); Renal artery stent (07/22/2003); Tonsillectomy; Cataract extraction (Bilateral); Colonoscopy; Joint replacement; Hip Arthroplasty (11/26/2011); NM MYOCAR PERF WALL MOTION (08/2007); Percutaneous coronary stent intervention (pci-s) (06/24/2003); Percutaneous coronary stent intervention (pci-s) (11/13/2005); transthoracic echocardiogram (11/23/2013; 01/07/2015:); Cardiac catheterization (01/17/2009); and Coronary angioplasty with stent (2007). Prior to Admission medications   Medication Sig Start Date End Date Taking? Authorizing Provider  acetaminophen (TYLENOL) 500 MG tablet Take 1,000 mg by mouth every 6 (six) hours as needed for mild pain.   Yes Historical Provider, MD  ALPRAZolam Prudy Feeler) 0.5 MG tablet Take 1 tablet (0.5 mg total) by mouth at bedtime. 04/30/16  Yes Filbert Schilder, MD  aspirin EC 325 MG EC tablet Take 1 tablet (325 mg total) by mouth daily. 04/30/16  Yes Filbert Schilder, MD  calcium citrate (CALCITRATE - DOSED IN MG ELEMENTAL CALCIUM) 950 MG tablet Take 0.5 tablets (100 mg of elemental calcium total) by mouth daily. 04/30/16  Yes Filbert Schilder, MD  Cholecalciferol (VITAMIN D-3 PO) Take 1,000 mg by mouth daily.    Yes Historical Provider, MD  docusate sodium (COLACE) 100 MG capsule Take 100 mg by mouth 2 (two) times daily.     Yes Historical Provider, MD  folic acid (FOLVITE) 1 MG tablet Take 1 mg by mouth daily.     Yes Historical Provider, MD  HYDROcodone-acetaminophen (NORCO/VICODIN) 5-325 MG tablet Take 1 tablet by mouth every 6 (six) hours as needed for severe pain. 04/30/16  Yes Filbert Schilder,  MD  Menthol, Topical Analgesic, (BIOFREEZE) 4 % GEL Apply 1 application topically 3 (three) times daily. Apply to right arm every shift   Yes Historical Provider, MD  mirtazapine (REMERON) 30 MG tablet Take 30 mg by mouth at bedtime.   11/25/15  Yes Historical Provider, MD  mometasone (ELOCON) 0.1 % cream Apply 1 application topically daily.   Yes Historical Provider, MD  Multiple Vitamin (MULITIVITAMIN WITH MINERALS) TABS Take 1 tablet by mouth daily.   Yes Historical Provider, MD  pantoprazole (PROTONIX) 40 MG tablet Take 1 tablet (40 mg total) by mouth 2 (two) times daily. 02/15/15  Yes Amy S Esterwood, PA-C  promethazine (PHENERGAN) 25 MG suppository Place 25 mg rectally every 12 (twelve) hours as needed for nausea or vomiting.   Yes Historical Provider, MD  temazepam (RESTORIL) 30 MG capsule Take 30 mg by mouth at bedtime.   Yes Historical Provider, MD   No Known Allergies  FAMILY HISTORY:  family history includes Diabetes in her son; Heart disease in her brother and sister; Hypertension in her father and mother; Stroke in her father and mother; Uterine cancer in her sister. SOCIAL HISTORY:  reports that she has never smoked. She has never used smokeless tobacco. She reports that she does not drink alcohol or use drugs.  REVIEW OF SYSTEMS:   Unable to obtain from pt  See HPI   SUBJECTIVE:  ON BIPAP   VITAL SIGNS: Temp:  [95.1 F (35.1 C)-97 F (36.1 C)] 95.1 F (35.1 C) (11/18 1231) Pulse Rate:  [36-99] 51 (11/18 1400) Resp:  [18-39] 20 (11/18 1400) BP: (62-121)/(40-78) 62/43 (11/18 1400) SpO2:  [82 %-98 %] 94 % (11/18 1400) FiO2 (%):  [60 %] 60 % (11/18 1157) Weight:  [60.3 kg (133 lb)] 60.3 kg (133 lb) (11/18 1324)  PHYSICAL EXAMINATION: General: Frail on BIPAP  Neuro:  Follows simple commands  HEENT: dry mucosa  Cardiovascular:  ST  Lungs:  Coarse crackles noted  Abdomen: distended, BS hypoactive  Musculoskeletal:  Intact  Skin:  Pale , mottled , no rash    Recent Labs Lab Aug 03, 2015 0935  NA 143  K 4.0  CL 110  CO2 12*  BUN 32*  CREATININE 1.92*  GLUCOSE 280*    Recent Labs Lab Aug 03, 2015 0935  HGB 13.5  HCT 43.3  WBC 11.4*  PLT 180   Ct Abdomen Pelvis Wo Contrast  Result Date:  10-31-2015 CLINICAL DATA:  82 year old female with abdominal distension, pain, emesis and diarrhea. EXAM: CT ABDOMEN AND PELVIS WITHOUT CONTRAST TECHNIQUE: Multidetector CT imaging of the abdomen and pelvis was performed following the standard protocol without IV contrast. COMPARISON:  Prior chest and pelvic radiographs. FINDINGS: Please note that parenchymal abnormalities may be missed without intravenous contrast. Bilateral hip replacement artifact obscures detail in the mid and lower pelvis Lower chest: Bilateral lower lobe consolidation/airspace disease is compatible with pneumonia. Cardiomegaly and coronary artery calcifications are present. No pleural effusion identified. Hepatobiliary: The liver and gallbladder are within normal limits. No biliary dilatation identified. Pancreas: Atrophic without other abnormality. Spleen: Unremarkable Adrenals/Urinary Tract: Bilateral renal atrophy and probable renal cysts noted. The adrenal glands and visualized portions of the bladder are unremarkable. Stomach/Bowel: The stomach and colon are fluid-filled and mildly distended. No definite bowel wall thickening identified. No dilated small bowel loops are noted. There is no evidence of pneumoperitoneum. Vascular/Lymphatic: Abdominal aortic atherosclerotic calcifications noted without aneurysm. No enlarged lymph nodes identified. Reproductive: No gross abnormality. Other: A small amount of ascites adjacent to  the liver and within the pelvis noted. No definite focal collection identified. Musculoskeletal: A 50% superior endplate compression fracture of L4 is identified, age indeterminate but appears new since 01/07/2015 T10 and T12 compression fractures are remote. IMPRESSION: Bilateral lower lobe pneumonia. Nonspecific mildly distended fluid-filled stomach and colon. This is compatible with at least a diarrheal state but no definite focal bowel wall thickening identified. Small amount of nonspecific ascites adjacent to the  liver and within the pelvis. Age-indeterminate 50% superior endplate compression fracture of L4, but appears new since 01/07/2015. Remote T10 and T12 compression fractures. Abdominal aortic atherosclerosis. Electronically Signed   By: Harmon Pier M.D.   On: Jun 12, 2016 11:31   Dg Chest Portable 1 View  Result Date: 06-12-2016 CLINICAL DATA:  Shortness of Breath EXAM: PORTABLE CHEST 1 VIEW COMPARISON:  02/28/2015 FINDINGS: Low lung volumes. Mild elevation of the right hemidiaphragm. No confluent opacities or effusions. Heart is upper limits normal in size. IMPRESSION: Low lung volumes.  No active disease. Electronically Signed   By: Charlett Nose M.D.   On: 06-12-16 09:41    ASSESSMENT / PLAN: 1. Severe Sepsis with probable Aspiration PNA  Plan  Fluid resusciation IV abx   Hold on pressors   2. Possible Ischemia Bowel vs Gastroenteritis /GIB w/ coffee ground emesis and dark stool   Plan :  NGT to suction   3. Acute Resp Failure   Plan  BIPAP support -caution as at risk for aspiration . If vomiting recurrs will need to transiton to O2 only .   Global : Discussion with family , Daughter. Pt to be DNR . Continue current care with no escalation and if no progress or deterioation than transition to comfort care .    ER notes indicated after leaving ER , pt with apenic episode and PEA arrest . Time of death was 1415.   Rubye Oaks NP-C  Pulmonary and Critical Care Medicine [facility] Pager: [phone]  June 12, 2016, 2:31 PM    STAFF NOTE: I, Rory Percy, MD FACP have personally reviewed patient's available data, including medical history, events of note, physical examination and test results as part of my evaluation. I have discussed with resident/NP and other care providers such as pharmacist, RN and RRT. In addition, I personally evaluated patient and elicited key findings of: lethargic, ronchi, severe distended abdo, this appears to be bowel ischemia/ ileus with  tarry vomitous leadintg to asp PNA, MODS, this is not survivable, place NGT to suction stat, contineud empiric abx, would NOT call surgeyr is is not a candidate, if vomit noted would then dc bipap, lactic out of proportion to just asp PNA, I have had extensive discussions with family . We discussed patients current circumstances and organ failures. We also discussed patient's prior wishes under circumstances such as this. Family has decided to NOT perform resuscitation if arrest but to continue current medical support for now. I let them know she may die today, to sdu triad if she survives the next hour, comfort care to cosnider   The patient is critically ill with multiple organ systems failure and requires high complexity decision making for assessment and support, frequent evaluation and titration of therapies, application of advanced monitoring technologies and extensive interpretation of multiple databases.   Critical Care Time devoted to patient care services described in this note is 35 Minutes. This time reflects time of care of this signee: Rory Percy, MD FACP. This critical care time does not reflect procedure time, or teaching time  or supervisory time of PA/NP/Med student/Med Resident etc but could involve care discussion time. Rest per NP/medical resident whose note is outlined above and that I agree with   Mcarthur Rossettianiel J. Tyson AliasFeinstein, MD, FACP Pgr: [phone] [place] [facility] 07/04/16 3:06 PM

## 2016-06-22 NOTE — ED Notes (Signed)
Critical care Provider at bedside. 

## 2016-06-22 NOTE — ED Notes (Signed)
Pt taken to ct with this rn.

## 2016-06-22 NOTE — ED Notes (Signed)
ED Provider at bedside. 

## 2016-06-22 NOTE — ED Notes (Signed)
Respiratory called to place pt on bipap

## 2016-06-22 NOTE — ED Provider Notes (Signed)
Emergency Department Provider Note   I have reviewed the triage vital signs and the nursing notes.   HISTORY  Chief Complaint Altered Mental Status   HPI [name] is a 82 y.o. female with PMH of CAD, GERD, TIA, and a-fib presents to the emergency department for evaluation of respiratory distress and syncope. EMS reported that they were called to the patient's nursing facility with report of cardiac arrest. They state that she was never in cardiac arrest but apparently had an episode of black emesis and syncope while sitting on the toilet. When they arrived they report hypoxemia and initial blood pressure in the 80s. They gave a 500 mL bolus and 2 DuoNeb treatments and feel that the patient's symptoms improved in route. They report that she is a full code.  Level V caveat. Patient is in acute respiratory distress and unable to provide significant HPI or ROS.    Past Medical History:  Diagnosis Date  . Anxiety   . Arthritis    R Hip total Arthroplasty & L Hip Hemiarthroplasty  . CAD S/P percutaneous coronary angioplasty 2004   ZETA BMS 4.0 mm X 15 mm in RCA - 2004; CYPHER DES 2.58mm x 18 mm to LAD (with PTCA of jailed D2 with ostial 85%)& 2 in 2007  . Cataract    removed bil eyes  . Depression   . Diverticulosis   . Gastric outlet obstruction 2012   remote -- s/p Vagotomy & Pyeloroplasty  . GERD (gastroesophageal reflux disease)    TIA  . History of blood transfusion    With Hip Surgery  . Hyperlipidemia   . Hypertension    H/o Renovascular HTN, s/p R RA Stent in 06/2003  . Multiple allergies   . Osteoporosis    s/p L hip Fxr - Hemiarthroplasty  . PAF (paroxysmal atrial fibrillation) [facility]) April 2015   Newly diagnosed by PCP  . PUD (peptic ulcer disease)    Gastric Ulcer  . Renal artery stenosis, native (HCC) 2003-2004   S/P R Renal A Stent 5 mm x 12 mm  . Stroke (HCC) 04/2016  . TIA (transient ischemic attack)     Patient Active Problem List   Diagnosis  Date Noted  . Sepsis (HCC) 06-28-2016  . Stroke (cerebrum) (HCC) 04/27/2016  . Acute CVA (cerebrovascular accident) (HCC) 04/27/2016  . Thrombotic stroke involving cerebral artery (HCC) 04/26/2016  . Rash of back 05/21/2015  . Chronic diastolic heart failure (HCC) 01/07/2015  . Chronic kidney disease stage III 01/07/2015  . Hypercalcemia 01/07/2015  . Dizziness 11/17/2014  . Permanent atrial fibrillation (HCC) 11/19/2013  . Exertional dyspnea 11/19/2013  . Closed left hip fracture (HCC) 11/25/2011  . Fall 11/25/2011  . Gastric outlet obstruction 05/04/2011  . NSAID-induced gastric ulcer 05/04/2011  . Nausea with vomiting 04/25/2011  . Loss of weight 04/25/2011  . Gastric ulcer, acute 04/25/2011  . History of vagotomy 04/25/2011  . GERD (gastroesophageal reflux disease) 04/24/2011  . Weight loss 04/24/2011  . Hyperlipidemia with target LDL less than 70 10/30/2007  . RENAL ARTERY STENOSIS 10/30/2007  . Peripheral vascular disease (HCC) 10/30/2007  . Peptic ulcer 10/30/2007  . Gastroparesis 10/30/2007  . HIATAL HERNIA, HX OF 10/30/2007  . Essential hypertension 04/21/2007  . GERD 04/21/2007  . OSTEOARTHRITIS 04/21/2007  . GASTRITIS 03/03/2007  . GASTRIC OUTLET OBSTRUCTION 01/10/2004  . CAD S/P percutaneous coronary angioplasty: BMS to RCA in 2004; DES to LAD with PTCA of D2 in 2007;  patent in 2010  06/20/2002    Class: Diagnosis of  . ESOPHAGEAL STRICTURE 11/10/2001  . DIVERTICULOSIS, COLON 11/10/2001    Past Surgical History:  Procedure Laterality Date  . CARDIAC CATHETERIZATION  01/17/2009   normal L main, normal LAD, normal L Cfx, RCA with patent stent (Dr. Erlene Quan)  . CATARACT EXTRACTION Bilateral   . COLONOSCOPY    . CORONARY ANGIOPLASTY WITH STENT PLACEMENT  2007   hx/notes 01/06/2015  . HIP ARTHROPLASTY  11/26/2011   Procedure: ARTHROPLASTY BIPOLAR HIP;  Surgeon: Javier Docker, MD;  Location: [facility] OR;  Service: Orthopedics;  Laterality: Left;  . JOINT REPLACEMENT      . NM MYOCAR PERF WALL MOTION  08/2007   persantine myoview - normal pattern of perfusion, EF 80%, low risk scan  . PERCUTANEOUS CORONARY STENT INTERVENTION (PCI-S)  06/24/2003   ACS Zeta BMS 4.0x1mm to RCA (Dr. Jonette Eva)  . PERCUTANEOUS CORONARY STENT INTERVENTION (PCI-S)  11/13/2005   Cypher DES 2.5x39mm to LAD; Cutting PTCA of Ostial D2 (jailed) (Dr. Jonette Eva)  . RENAL ARTERY STENT  07/22/2003   Cordis 5x45mm stent to right renal artery (Dr. Jonette Eva)  . REPLACEMENT TOTAL HIP W/  RESURFACING IMPLANTS Right 04/2002  . TONSILLECTOMY    . TRANSTHORACIC ECHOCARDIOGRAM  11/23/2013; 01/07/2015:   a) EF 55-60%, no regional WMA; MAC without MR;; b) normal LV size and thickness. EF 50-55%. No RWMA, mild LA dilation with mod PR  . VAGOTOMY AND PYLOROPLASTY  01/13/04    Current Outpatient Rx  . Order #: [id number] Class: Historical Med  . Order #: [id number] Class: Print  . Order #: [id number] Class: Normal  . Order #: [id number] Class: No Print  . Order #: [id number] Class: Historical Med  . Order #: [id number] Class: Historical Med  . Order #: [id number] Class: Historical Med  . Order #: [id number] Class: Print  . Order #: [id number] Class: Historical Med  . Order #: [id number] Class: Historical Med  . Order #: [id number] Class: Historical Med  . Order #: [id number] Class: Historical Med  . Order #: [id number] Class: Normal  . Order #: [id number] Class: Historical Med  . Order #: [id number] Class: Historical Med    Allergies Patient has no known allergies.  Family History  Problem Relation Age of Onset  . Hypertension Mother   . Stroke Mother   . Hypertension Father   . Stroke Father   . Uterine cancer Sister   . Heart disease Sister   . Diabetes Son   . Heart disease Brother   . Esophageal cancer Neg Hx   . Colon cancer Neg Hx   . Stomach cancer Neg Hx     Social History Social History  Substance Use Topics  . Smoking status: Never Smoker  . Smokeless tobacco: Never Used  . Alcohol use No     Review of Systems  Patinet is in severe respiratory distress and unable to provide significant ROS.   ____________________________________________   PHYSICAL EXAM:  VITAL SIGNS: ED Triage Vitals  Enc Vitals Group     BP 2016/06/18 0918 121/67     Pulse Rate 2016/06/18 0918 99     Resp 06/18/16 0918 25     Temp 06/18/16 0916 97 F (36.1 C)     Temp Source 06/18/16 0916 Rectal     SpO2 06-18-16 0918 92 %   Constitutional: Alert with mumbling speech. Notable tachypnea.  Eyes: Conjunctivae are normal.  Head: Atraumatic. Nose: No congestion/rhinnorhea. Mouth/Throat: Mucous membranes are dry.  Neck: No stridor.   Cardiovascular: A fib. Good peripheral circulation. Grossly  normal heart sounds.   Respiratory: Increased respiratory effort.  No retractions. Lungs with faint wheezing bilaterally. Diminished breath sounds at the bases.  Gastrointestinal: Soft and nontender. Positive distention.  Musculoskeletal: No lower extremity tenderness nor edema. No gross deformities of extremities. Neurologic:  No gross focal neurologic deficits are appreciated.  Skin:  Skin is cool, dry and intact. No rash noted.  ____________________________________________   LABS (all labs ordered are listed, but only abnormal results are displayed)  Labs Reviewed  COMPREHENSIVE METABOLIC PANEL - Abnormal; Notable for the following:       Result Value   CO2 12 (*)    Glucose, Bld 280 (*)    BUN 32 (*)    Creatinine, Ser 1.92 (*)    Total Protein 6.3 (*)    Albumin 3.0 (*)    AST 103 (*)    ALT <5 (*)    GFR calc non Af Amer 21 (*)    GFR calc Af Amer 24 (*)    Anion gap 21 (*)    All other components within normal limits  CBC WITH DIFFERENTIAL/PLATELET - Abnormal; Notable for the following:    WBC 11.4 (*)    MCV 102.4 (*)    Neutro Abs 9.9 (*)    All other components within normal limits  URINALYSIS, ROUTINE W REFLEX MICROSCOPIC (NOT AT [facility]) - Abnormal; Notable for the following:     APPearance CLOUDY (*)    Hgb urine dipstick SMALL (*)    Protein, ur 30 (*)    Leukocytes, UA LARGE (*)    All other components within normal limits  URINE MICROSCOPIC-ADD ON - Abnormal; Notable for the following:    Squamous Epithelial / LPF 0-5 (*)    Bacteria, UA MANY (*)    All other components within normal limits  I-STAT CG4 LACTIC ACID, ED - Abnormal; Notable for the following:    Lactic Acid, Venous 13.86 (*)    All other components within normal limits  I-STAT TROPOININ, ED - Abnormal; Notable for the following:    Troponin i, poc 0.09 (*)    All other components within normal limits  POC OCCULT BLOOD, ED - Abnormal; Notable for the following:    Fecal Occult Bld POSITIVE (*)    All other components within normal limits  I-STAT CG4 LACTIC ACID, ED - Abnormal; Notable for the following:    Lactic Acid, Venous 10.48 (*)    All other components within normal limits  CULTURE, BLOOD (ROUTINE X 2)  CULTURE, BLOOD (ROUTINE X 2)  URINE CULTURE   ____________________________________________  EKG   EKG Interpretation  Date/Time:  Saturday Jun 18, 2016 09:13:58 EST Ventricular Rate:  81 PR Interval:    QRS Duration: 99 QT Interval:  448 QTC Calculation: 501 R Axis:   77 Text Interpretation:  Atrial fibrillation RSR' in V1 or V2, probably normal variant Inferior infarct, age indeterminate Prolonged QT interval No STEMI.  Confirmed by Janine Reller MD, Ulus Hazen [phone]) on June 18, 2016 9:24:48 AM       ____________________________________________  RADIOLOGY  Ct Abdomen Pelvis Wo Contrast  Result Date: 06/18/2016 CLINICAL DATA:  82 year old female with abdominal distension, pain, emesis and diarrhea. EXAM: CT ABDOMEN AND PELVIS WITHOUT CONTRAST TECHNIQUE: Multidetector CT imaging of the abdomen and pelvis was performed following the standard protocol without IV contrast. COMPARISON:  Prior chest and pelvic radiographs. FINDINGS: Please note that parenchymal abnormalities may be  missed without intravenous contrast. Bilateral hip replacement artifact obscures detail in the mid  and lower pelvis Lower chest: Bilateral lower lobe consolidation/airspace disease is compatible with pneumonia. Cardiomegaly and coronary artery calcifications are present. No pleural effusion identified. Hepatobiliary: The liver and gallbladder are within normal limits. No biliary dilatation identified. Pancreas: Atrophic without other abnormality. Spleen: Unremarkable Adrenals/Urinary Tract: Bilateral renal atrophy and probable renal cysts noted. The adrenal glands and visualized portions of the bladder are unremarkable. Stomach/Bowel: The stomach and colon are fluid-filled and mildly distended. No definite bowel wall thickening identified. No dilated small bowel loops are noted. There is no evidence of pneumoperitoneum. Vascular/Lymphatic: Abdominal aortic atherosclerotic calcifications noted without aneurysm. No enlarged lymph nodes identified. Reproductive: No gross abnormality. Other: A small amount of ascites adjacent to the liver and within the pelvis noted. No definite focal collection identified. Musculoskeletal: A 50% superior endplate compression fracture of L4 is identified, age indeterminate but appears new since 01/07/2015 T10 and T12 compression fractures are remote. IMPRESSION: Bilateral lower lobe pneumonia. Nonspecific mildly distended fluid-filled stomach and colon. This is compatible with at least a diarrheal state but no definite focal bowel wall thickening identified. Small amount of nonspecific ascites adjacent to the liver and within the pelvis. Age-indeterminate 50% superior endplate compression fracture of L4, but appears new since 01/07/2015. Remote T10 and T12 compression fractures. Abdominal aortic atherosclerosis. Electronically Signed   By: Harmon PierJeffrey  Hu M.D.   On: 2016/02/26 11:31   Dg Chest Portable 1 View  Result Date: 2016/02/26 CLINICAL DATA:  Shortness of Breath EXAM: PORTABLE  CHEST 1 VIEW COMPARISON:  02/28/2015 FINDINGS: Low lung volumes. Mild elevation of the right hemidiaphragm. No confluent opacities or effusions. Heart is upper limits normal in size. IMPRESSION: Low lung volumes.  No active disease. Electronically Signed   By: Charlett NoseKevin  Dover M.D.   On: 2016/02/26 09:41    ____________________________________________   PROCEDURES  Procedure(s) performed:   Procedures  CRITICAL CARE Performed by: Maia PlanJoshua G Megyn Leng Total critical care time: 80 minutes Critical care time was exclusive of separately billable procedures and treating other patients. Critical care was necessary to treat or prevent imminent or life-threatening deterioration. Critical care was time spent personally by me on the following activities: development of treatment plan with patient and/or surrogate as well as nursing, discussions with consultants, evaluation of patient's response to treatment, examination of patient, obtaining history from patient or surrogate, ordering and performing treatments and interventions, ordering and review of laboratory studies, ordering and review of radiographic studies, pulse oximetry and re-evaluation of patient's condition.  Alona BeneJoshua Quavon Keisling, MD Emergency Medicine  ____________________________________________   INITIAL IMPRESSION / ASSESSMENT AND PLAN / ED COURSE  Pertinent labs & imaging results that were available during my care of the patient were reviewed by me and considered in my medical decision making (see chart for details).  Patient resents to the emergency department for evaluation of respiratory distress and one episode of black emesis. She is unable to provide significant historical detail. Most of my information is obtained from EMS report. She seems to be improving with duo nebs, oxygen, repositioning, and small IV fluid bolus given by EMS. Unclear if this represents a primary infectious etiology, underlying GI bleed, ACS, PE, or other acute event at  this time. Famile en route to assist with additional detail.   09:39 AM The patient's daughter is now at bedside. She states the patient was complaining of "feeling sick" yesterday evening when talking to her on the phone. She was called to the nursing home at 6 AM today when patient went unresponsive. The  daughter states that there was a brief period of light chest compressions and EMS was called. The patient is able to now tell me her name and that she is not in pain and does not feel she is having difficulty breathing but seems to have some continued confusion. Will initiate IVF and antibiotics while awaiting labs and CXR results.   09:51 AM Continuing aggressive IV fluid hydration. Plan to transition patient to BiPAP. Her lactate is significantly elevated to 13 with positive troponin. In this clinical scenario I have low suspicion for a primary cardiac etiology. Discussed possibility of intubation and central line placement with daughter at bedside.   10:52 AM Patient is improved clinically. Her eyes are open and she is more cooperative and less confused. Blood pressure improved. There is a delay in placing the patient on BiPAP but this will happen shortly. Plan for noncontrast CT of the abdomen with notable distention. The patient has no tenderness here. We will defer by mouth contrast given her tenuous respiratory status and continued concern that she may require intubation at some point.   12:06 PM Spoke with Dr. Tyson AliasFeinstein with critical care. They will be down to see the patient and evaluate for ICU admission vs less aggressive care. Family at bedside.   12:42 PM Pressures back down to the 80s. Patient now hypothermic with bear hugger applied. Patient opens her eyes to voice and verbalizes. Continue IV fluids. Family not at bedside at this time but will attempt to find them and have additional goals of care discussion.   01:19 PM  spoke with critical care after discussion with the family in  assessment of the patient. They report that the family is now not interested in pressors, intubation, or chest compressions. Would like to continue resuscitation and abx. Will place NG tube for comfort and consult hospitlaist for admission.  02:43 PM Spoke with the on-call provider for Dr. Tiburcio PeaHarris. They will be happy to complete the death certificate. I provided my condolences to the family at bedside. This is not an ME case.  Time of death 14:15.  ____________________________________________  FINAL CLINICAL IMPRESSION(S) / ED DIAGNOSES  Final diagnoses:  Sepsis, due to unspecified organism [facility](HCC)     MEDICATIONS GIVEN DURING THIS VISIT:  Medications  piperacillin-tazobactam (ZOSYN) IVPB 3.375 g (not administered)  vancomycin (VANCOCIN) IVPB 750 mg/150 ml premix (not administered)  piperacillin-tazobactam (ZOSYN) IVPB 3.375 g (0 g Intravenous Stopped 2015/10/20 1050)  vancomycin (VANCOCIN) IVPB 1000 mg/200 mL premix (0 mg Intravenous Stopped 2015/10/20 1151)  sodium chloride 0.9 % bolus 1,000 mL (0 mLs Intravenous Stopped 2015/10/20 1111)    And  sodium chloride 0.9 % bolus 1,000 mL (0 mLs Intravenous Stopped 2015/10/20 1151)  0.9 %  sodium chloride infusion ( Intravenous Stopped 2015/10/20 1556)  sodium chloride 0.9 % bolus 1,000 mL (0 mLs Intravenous Stopped 2015/10/20 1406)     NEW OUTPATIENT MEDICATIONS STARTED DURING THIS VISIT:  None   Note:  This document was prepared using Dragon voice recognition software and may include unintentional dictation errors.  Alona BeneJoshua Jessalyn Hinojosa, MD Emergency Medicine   Maia PlanJoshua G Brandace Cargle, MD 2015/10/20 [phone]

## 2016-06-22 NOTE — Progress Notes (Signed)
Placed patient on BiPAP with Servo I vent 5/5 and 60% patient tolerated well.

## 2016-06-22 NOTE — ED Triage Notes (Signed)
Per EMS- pt hs had black emesis and diarrhea. Pt was at baseline last night. A&Ox4 per facility. Pt was hypotensive at facility and altered this morning. Pt was found to by hypotensive tachypnic, and hypoxic. Pt is alert upon arrival pt has old stoke with right sided weakness. Pt 500ml of NS in route. Pt received 10mg  albuterol and 0.5 mg of atrovent.

## 2016-06-22 NOTE — Progress Notes (Addendum)
Pharmacy Antibiotic Note  [name] is a 82 y.o. female admitted on 05/24/2016 with sepsis.  Pharmacy has been consulted for vanc and zosyn dosing. CrCl 13.1/SCr 1.92, WBC elevated 11.4, LA elevated 12.86, blood cultures sent. Afeb, HR 99, RR 25, BP 121/61. Pt rec'd zosyn 3.375 mg x 1 and vancomycin 1000 mg x 1 in ED prior to pharmacy consult.  Plan: Vancomycin 750 mg IV every 48 hours.  Goal trough 15-20 mcg/mL. Zosyn 3.375g IV q8h (4 hour infusion). Monitor CBC, temp, clinical picture, LOT, renal function Vancomycin trough PRN     Temp (24hrs), Avg:97 F (36.1 C), Min:97 F (36.1 C), Max:97 F (36.1 C)   Recent Labs Lab 10/24/2015 0935 10/24/2015 0940  WBC 11.4*  --   CREATININE 1.92*  --   LATICACIDVEN  --  13.86*    Estimated Creatinine Clearance: 13.1 mL/min (by C-G formula based on SCr of 1.92 mg/dL (H)).    No Known Allergies  Antimicrobials this admission: vanc 11/18  >>   zosyn 11/18 >>   Dose adjustments this admission: n/a  Microbiology results: 11/18 BCx: sent   Allena Katzaroline E Philis Doke, Pharm.D. PGY1 Pharmacy Resident 11/02/201711:13 AM Pager [phone]

## 2016-06-22 NOTE — H&P (Signed)
History and Physical    [name] ZOX:[id number] DOB: October 31, 1917 DOA: 2016-07-09   PCP: [name], MD   Patient coming from: SNF     Chief Complaint: Altered mental status   HPI: [name] is a 82 y.o. female brought from her nursing home after nearly syncopal episode with 3 day prodrome of nausea vomiting and coffee ground MNCs with dark stools, suspected aspiration pneumonia and sepsis. Upon evaluation of the emergency department, her confusion status continued. Other information was unable to be obtained, as the patient is confused. CT of the abdomen and pelvis showed distention in fluid field call on, question ischemia bowel versus gastroenteritis. She was started on IV fluid resuscitation. She was hypotensive, but responding to this IV fluids. The patient remains on BiPAP, and does not respond to simple, and. White count was elevated at 11.4, lactic acid very high that 13.8 seeks. Upon discussion with the patient's family, it was concluded that she is DNR, and no aggressive or heroic measures are to be proceeded. Critical care medicine, recommended that hospitalist team admits the patient as she is not to be intubated, and be treated for reversible causes   ED Course:  BP (!) 75/45   Pulse 66   Temp (!) 95.1 F (35.1 C) (Rectal) Comment: bear hugger applied  Resp 18   Ht 5\' 2"  (1.575 m)   Wt 60.3 kg (133 lb)   SpO2 90%   BMI 24.33 kg/m    creatinine 1.92 glucose 280 white count 11.4   CTA/P   Bilateral lower lobe pneumonia. Nonspecific mildly distended fluid-filled stomach and colon. This is compatible with at least a diarrheal state but no definite focal bowel wall thickening identified. Small amount of nonspecific ascites adjacent to the liver and within the pelvis. Age-indeterminate 50% superior endplate compression fracture of L4, but appears new since 01/07/2015. Remote T10 and T12 compression fractures. Abdominal aortic atherosclerosi Review of Systems: As  per HPI otherwise 10 point review of systems negative.   Past Medical History:  Diagnosis Date  . Anxiety   . Arthritis    R Hip total Arthroplasty & L Hip Hemiarthroplasty  . CAD S/P percutaneous coronary angioplasty 2004   ZETA BMS 4.0 mm X 15 mm in RCA - 2004; CYPHER DES 2.70mm x 18 mm to LAD (with PTCA of jailed D2 with ostial 85%)& 2 in 2007  . Cataract    removed bil eyes  . Depression   . Diverticulosis   . Gastric outlet obstruction 2012   remote -- s/p Vagotomy & Pyeloroplasty  . GERD (gastroesophageal reflux disease)    TIA  . History of blood transfusion    With Hip Surgery  . Hyperlipidemia   . Hypertension    H/o Renovascular HTN, s/p R RA Stent in 06/2003  . Multiple allergies   . Osteoporosis    s/p L hip Fxr - Hemiarthroplasty  . PAF (paroxysmal atrial fibrillation) [facility]) April 2015   Newly diagnosed by PCP  . PUD (peptic ulcer disease)    Gastric Ulcer  . Renal artery stenosis, native (HCC) 2003-2004   S/P R Renal A Stent 5 mm x 12 mm  . Stroke (HCC) 04/2016  . TIA (transient ischemic attack)     Past Surgical History:  Procedure Laterality Date  . CARDIAC CATHETERIZATION  01/17/2009   normal L main, normal LAD, normal L Cfx, RCA with patent stent (Dr. Erlene Quan)  . CATARACT EXTRACTION Bilateral   . COLONOSCOPY    .  CORONARY ANGIOPLASTY WITH STENT PLACEMENT  2007   hx/notes 01/06/2015  . HIP ARTHROPLASTY  11/26/2011   Procedure: ARTHROPLASTY BIPOLAR HIP;  Surgeon: Javier DockerJeffrey C Beane, MD;  Location: [facility] OR;  Service: Orthopedics;  Laterality: Left;  . JOINT REPLACEMENT    . NM MYOCAR PERF WALL MOTION  08/2007   persantine myoview - normal pattern of perfusion, EF 80%, low risk scan  . PERCUTANEOUS CORONARY STENT INTERVENTION (PCI-S)  06/24/2003   ACS Zeta BMS 4.0x4115mm to RCA (Dr. Jonette Eva. [facility])  . PERCUTANEOUS CORONARY STENT INTERVENTION (PCI-S)  11/13/2005   Cypher DES 2.5x6718mm to LAD; Cutting PTCA of Ostial D2 (jailed) (Dr. Jonette Eva. [facility])  . RENAL ARTERY STENT   07/22/2003   Cordis 5x6712mm stent to right renal artery (Dr. Jonette Eva. [facility])  . REPLACEMENT TOTAL HIP W/  RESURFACING IMPLANTS Right 04/2002  . TONSILLECTOMY    . TRANSTHORACIC ECHOCARDIOGRAM  11/23/2013; 01/07/2015:   a) EF 55-60%, no regional WMA; MAC without MR;; b) normal LV size and thickness. EF 50-55%. No RWMA, mild LA dilation with mod PR  . VAGOTOMY AND PYLOROPLASTY  01/13/04    Social History Social History   Social History  . Marital status: Widowed    Spouse name: N/A  . Number of children: 3  . Years of education: N/A   Occupational History  . retired    Social History Main Topics  . Smoking status: Never Smoker  . Smokeless tobacco: Never Used  . Alcohol use No  . Drug use: No  . Sexual activity: No   Other Topics Concern  . Not on file   Social History Narrative   She was a former long term patient of Dr. Susa Griffinsichard [facility]ra[facility]. He also followed her husband, Rayna SextonRalph, who died several years ago at age 80, he was followed for a history of aortic valve replacement.   She is therefore a widowed mother of 2, grandmother of at least 3.   She still lives alone, but her daughter lives next door. She gets around quite well using a walker until her recent problems with hip pain.   She is a nonsmoker, never drank alcohol.     No Known Allergies  Family History  Problem Relation Age of Onset  . Hypertension Mother   . Stroke Mother   . Hypertension Father   . Stroke Father   . Uterine cancer Sister   . Heart disease Sister   . Diabetes Son   . Heart disease Brother   . Esophageal cancer Neg Hx   . Colon cancer Neg Hx   . Stomach cancer Neg Hx       Prior to Admission medications   Medication Sig Start Date End Date Taking? Authorizing Provider  acetaminophen (TYLENOL) 500 MG tablet Take 1,000 mg by mouth every 6 (six) hours as needed for mild pain.   Yes Historical Provider, MD  ALPRAZolam Prudy Feeler(XANAX) 0.5 MG tablet Take 1 tablet (0.5 mg total) by mouth at bedtime.  04/30/16  Yes Filbert SchilderAlexandria U Kadolph, MD  aspirin EC 325 MG EC tablet Take 1 tablet (325 mg total) by mouth daily. 04/30/16  Yes Filbert SchilderAlexandria U Kadolph, MD  calcium citrate (CALCITRATE - DOSED IN MG ELEMENTAL CALCIUM) 950 MG tablet Take 0.5 tablets (100 mg of elemental calcium total) by mouth daily. 04/30/16  Yes Filbert SchilderAlexandria U Kadolph, MD  Cholecalciferol (VITAMIN D-3 PO) Take 1,000 mg by mouth daily.    Yes Historical Provider, MD  docusate sodium (COLACE) 100 MG capsule Take 100  mg by mouth 2 (two) times daily.     Yes Historical Provider, MD  folic acid (FOLVITE) 1 MG tablet Take 1 mg by mouth daily.     Yes Historical Provider, MD  HYDROcodone-acetaminophen (NORCO/VICODIN) 5-325 MG tablet Take 1 tablet by mouth every 6 (six) hours as needed for severe pain. 04/30/16  Yes Filbert Schilder, MD  Menthol, Topical Analgesic, (BIOFREEZE) 4 % GEL Apply 1 application topically 3 (three) times daily. Apply to right arm every shift   Yes Historical Provider, MD  mirtazapine (REMERON) 30 MG tablet Take 30 mg by mouth at bedtime.  11/25/15  Yes Historical Provider, MD  mometasone (ELOCON) 0.1 % cream Apply 1 application topically daily.   Yes Historical Provider, MD  Multiple Vitamin (MULITIVITAMIN WITH MINERALS) TABS Take 1 tablet by mouth daily.   Yes Historical Provider, MD  pantoprazole (PROTONIX) 40 MG tablet Take 1 tablet (40 mg total) by mouth 2 (two) times daily. 02/15/15  Yes Amy S Esterwood, PA-C  promethazine (PHENERGAN) 25 MG suppository Place 25 mg rectally every 12 (twelve) hours as needed for nausea or vomiting.   Yes Historical Provider, MD  temazepam (RESTORIL) 30 MG capsule Take 30 mg by mouth at bedtime.   Yes Historical Provider, MD    Physical Exam:    Vitals:   2016-06-24 1315 June 24, 2016 1324 2016-06-24 1336 2016/06/24 1342  BP: (!) 89/55  (!) 75/45   Pulse: 87  67 66  Resp: (!) 39  23 18  Temp:      TempSrc:      SpO2: 94%  (!) 82% 90%  Weight:  60.3 kg (133 lb)    Height:            Constitutional: moaning, ill appearing, frail on BiPAP Vitals:   06-24-16 1315 June 24, 2016 1324 24-Jun-2016 1336 2016/06/24 1342  BP: (!) 89/55  (!) 75/45   Pulse: 87  67 66  Resp: (!) 39  23 18  Temp:      TempSrc:      SpO2: 94%  (!) 82% 90%  Weight:  60.3 kg (133 lb)    Height:       Eyes: PERRL, lids and conjunctivae normal ENMT: Mucous membranes are dry  Posterior pharynx clear of any exudate or lesions edentulous Neck: normal, supple, no masses, no thyromegaly Respiratory:  Anterior coarse crackles noted. clear to auscultation bilaterally, no wheezing, no crackles. Normal respiratory effort. No accessory muscle use.  Cardiovascular: tachy  rate and rhythm, no murmurs / rubs / gallops. No extremity edema. 2+ pedal pulses. No carotid bruits.  Abdomen: distended decreased bowel sounds  No hepatosplenomegaly. Bowel sounds positive.  Musculoskeletal: no clubbing / cyanosis. No joint deformity upper and lower extremities. Good ROM, no contractures.temporal wasting noted  Skin: no rashes, lesions, ulcers.  Neurologic:patient unable to follow commands      Labs on Admission: I have personally reviewed following labs and imaging studies  CBC:  Recent Labs Lab 06-24-16 0935  WBC 11.4*  NEUTROABS 9.9*  HGB 13.5  HCT 43.3  MCV 102.4*  PLT 180    Basic Metabolic Panel:  Recent Labs Lab 06-24-2016 0935  NA 143  K 4.0  CL 110  CO2 12*  GLUCOSE 280*  BUN 32*  CREATININE 1.92*  CALCIUM 9.8    GFR: Estimated Creatinine Clearance: 15 mL/min (by C-G formula based on SCr of 1.92 mg/dL (H)).  Liver Function Tests:  Recent Labs Lab June 24, 2016 0935  AST 103*  ALT <  5*  ALKPHOS 86  BILITOT 0.9  PROT 6.3*  ALBUMIN 3.0*   No results for input(s): LIPASE, AMYLASE in the last 168 hours. No results for input(s): AMMONIA in the last 168 hours.  Coagulation Profile: No results for input(s): INR, PROTIME in the last 168 hours.  Cardiac Enzymes: No results for input(s):  CKTOTAL, CKMB, CKMBINDEX, TROPONINI in the last 168 hours.  BNP (last 3 results) No results for input(s): PROBNP in the last 8760 hours.  HbA1C: No results for input(s): HGBA1C in the last 72 hours.  CBG: No results for input(s): GLUCAP in the last 168 hours.  Lipid Profile: No results for input(s): CHOL, HDL, LDLCALC, TRIG, CHOLHDL, LDLDIRECT in the last 72 hours.  Thyroid Function Tests: No results for input(s): TSH, T4TOTAL, FREET4, T3FREE, THYROIDAB in the last 72 hours.  Anemia Panel: No results for input(s): VITAMINB12, FOLATE, FERRITIN, TIBC, IRON, RETICCTPCT in the last 72 hours.  Urine analysis:    Component Value Date/Time   COLORURINE YELLOW 07-01-16 1045   APPEARANCEUR CLOUDY (A) 2016/07/01 1045   LABSPEC 1.015 Jul 01, 2016 1045   PHURINE 5.0 07-01-16 1045   GLUCOSEU NEGATIVE 07-01-16 1045   HGBUR SMALL (A) 07/01/16 1045   BILIRUBINUR NEGATIVE Jul 01, 2016 1045   KETONESUR NEGATIVE 2016-07-01 1045   PROTEINUR 30 (A) 07/01/16 1045   UROBILINOGEN 0.2 01/06/2015 1630   NITRITE NEGATIVE July 01, 2016 1045   LEUKOCYTESUR LARGE (A) 07/01/16 1045    Sepsis Labs: @LABRCNTIP (procalcitonin:4,lacticidven:4) )No results found for this or any previous visit (from the past 240 hour(s)).   Radiological Exams on Admission: Ct Abdomen Pelvis Wo Contrast  Result Date: 2016/07/01 CLINICAL DATA:  82 year old female with abdominal distension, pain, emesis and diarrhea. EXAM: CT ABDOMEN AND PELVIS WITHOUT CONTRAST TECHNIQUE: Multidetector CT imaging of the abdomen and pelvis was performed following the standard protocol without IV contrast. COMPARISON:  Prior chest and pelvic radiographs. FINDINGS: Please note that parenchymal abnormalities may be missed without intravenous contrast. Bilateral hip replacement artifact obscures detail in the mid and lower pelvis Lower chest: Bilateral lower lobe consolidation/airspace disease is compatible with pneumonia. Cardiomegaly and  coronary artery calcifications are present. No pleural effusion identified. Hepatobiliary: The liver and gallbladder are within normal limits. No biliary dilatation identified. Pancreas: Atrophic without other abnormality. Spleen: Unremarkable Adrenals/Urinary Tract: Bilateral renal atrophy and probable renal cysts noted. The adrenal glands and visualized portions of the bladder are unremarkable. Stomach/Bowel: The stomach and colon are fluid-filled and mildly distended. No definite bowel wall thickening identified. No dilated small bowel loops are noted. There is no evidence of pneumoperitoneum. Vascular/Lymphatic: Abdominal aortic atherosclerotic calcifications noted without aneurysm. No enlarged lymph nodes identified. Reproductive: No gross abnormality. Other: A small amount of ascites adjacent to the liver and within the pelvis noted. No definite focal collection identified. Musculoskeletal: A 50% superior endplate compression fracture of L4 is identified, age indeterminate but appears new since 01/07/2015 T10 and T12 compression fractures are remote. IMPRESSION: Bilateral lower lobe pneumonia. Nonspecific mildly distended fluid-filled stomach and colon. This is compatible with at least a diarrheal state but no definite focal bowel wall thickening identified. Small amount of nonspecific ascites adjacent to the liver and within the pelvis. Age-indeterminate 50% superior endplate compression fracture of L4, but appears new since 01/07/2015. Remote T10 and T12 compression fractures. Abdominal aortic atherosclerosis. Electronically Signed   By: Harmon Pier M.D.   On: 2016/07/01 11:31   Dg Chest Portable 1 View  Result Date: July 01, 2016 CLINICAL DATA:  Shortness of Breath EXAM: PORTABLE  CHEST 1 VIEW COMPARISON:  02/28/2015 FINDINGS: Low lung volumes. Mild elevation of the right hemidiaphragm. No confluent opacities or effusions. Heart is upper limits normal in size. IMPRESSION: Low lung volumes.  No active  disease. Electronically Signed   By: Charlett NoseKevin  Dover M.D.   On: 08-05-15 09:41    EKG: Independently reviewed.  Assessment/Plan Active Problems:   Sepsis (HCC)   Hyperlipidemia with target LDL less than 70   Essential hypertension   CAD S/P percutaneous coronary angioplasty: BMS to RCA in 2004; DES to LAD with PTCA of D2 in 2007;  patent in 2010   RENAL ARTERY STENOSIS   Peripheral vascular disease (HCC)   ESOPHAGEAL STRICTURE   GERD   Peptic ulcer   GASTRIC OUTLET OBSTRUCTION   Weight loss   NSAID-induced gastric ulcer   Permanent atrial fibrillation (HCC)   Exertional dyspnea   Chronic diastolic heart failure (HCC)   Chronic kidney disease stage III   Stroke (cerebrum) (HCC)    Sepsis likely due to urine and/otr respiratory source, organism unknown. Suspect respiratory infection due to aspiration.   Patient meets criteria given tachycardia, tachypnea, hypothermia, leukocytosis. UA with many bacteria. CXR Bilateral lower Pnweumonia   Initial Lactic acid 13.86->10.48 T max 95.1, she is on bear hugger . patient's EKG without new changes, Afib  Will place patient on oxygen and titrate O2 as needed.  Received IV Vanco ZOsyn. 3 L fluid . CCM recommends admission by Hospitalists as patient is DNR  Admit to SDU Sepsis order set  IV antibiotics with Vanco /CeftriaxoneDuoNebs Follow lactic acid q 6 hrs Follow blood and urine cultures IV fluids at 100 cc/h.  Procalcitonin order set  UA, cultures CBC in am    Hematemesis, likely in the setting of  Plavix for Rx of recent stroke. NGT placed. Hcult +  H/o GIB in the past   Check PT/INR/PTT Type and screen complete  NPO, advance to Clear liquids only Normal saline IV fluid as above   Protonix 40mg  IV BID pending GI eval Hold all NSAIDs including preadmission baby aspirin   Chronic kidney disease stage 3   Current Cr 1.92, expected to improve with IVF  Lab Results  Component Value Date   CREATININE 1.92 (H) 08-05-15    CREATININE 1.0 05/07/2016   CREATININE 1.03 (H) 04/27/2016   IVF Hold diuretics  Repeat CMET in am    DVT prophylaxis:  SCD's  Code Status:   DNR Family Communication:  Discussed with patient family  Disposition Plan: Expect patient to be discharged to home after condition improves Consults called:    None Admission status: SDU    Grisell Bissette E, PA-C [facility]   08-05-15, 1:52 PM

## 2016-06-22 NOTE — Discharge Summary (Signed)
Death Summary  Diane MaesLillie K Dougan ZOX:[id number] DOB: June 27, 1918 DOA: 02-24-16  PCP: [name], WILLIAM, MD  Admit date: 02-24-16 Date of Death: 02-24-16 Time of Death: 1415 Notification: [name], WILLIAM, MD notified of death of 02-24-16   History of present illness:  [name] is a 82 y.o. female with a history of HTN, CKD, COPD, CAD [name] presented with complaint of severe sepsis from UTI and aspiration pneumonia. [name] did not improve after IV antibiotics and fluid resuscitation. Pt was DNR. Became apneic and went into PEA arrest while in the ED. Pt pronounced at 1415.   Final Diagnoses:  1. Severe Sepsis 2. UTI 3. Aspiration PNA 4. Lactic Acidosis 5. Acute Renal Failure   The results of significant diagnostics from this hospitalization (including imaging, microbiology, ancillary and laboratory) are listed below for reference.    Significant Diagnostic Studies: Ct Abdomen Pelvis Wo Contrast  Result Date: 02-24-16 CLINICAL DATA:  82 year old female with abdominal distension, pain, emesis and diarrhea. EXAM: CT ABDOMEN AND PELVIS WITHOUT CONTRAST TECHNIQUE: Multidetector CT imaging of the abdomen and pelvis was performed following the standard protocol without IV contrast. COMPARISON:  Prior chest and pelvic radiographs. FINDINGS: Please note that parenchymal abnormalities may be missed without intravenous contrast. Bilateral hip replacement artifact obscures detail in the mid and lower pelvis Lower chest: Bilateral lower lobe consolidation/airspace disease is compatible with pneumonia. Cardiomegaly and coronary artery calcifications are present. No pleural effusion identified. Hepatobiliary: The liver and gallbladder are within normal limits. No biliary dilatation identified. Pancreas: Atrophic without other abnormality. Spleen: Unremarkable Adrenals/Urinary Tract: Bilateral renal atrophy and probable renal cysts noted. The adrenal glands and  visualized portions of the bladder are unremarkable. Stomach/Bowel: The stomach and colon are fluid-filled and mildly distended. No definite bowel wall thickening identified. No dilated small bowel loops are noted. There is no evidence of pneumoperitoneum. Vascular/Lymphatic: Abdominal aortic atherosclerotic calcifications noted without aneurysm. No enlarged lymph nodes identified. Reproductive: No gross abnormality. Other: A small amount of ascites adjacent to the liver and within the pelvis noted. No definite focal collection identified. Musculoskeletal: A 50% superior endplate compression fracture of L4 is identified, age indeterminate but appears new since 01/07/2015 T10 and T12 compression fractures are remote. IMPRESSION: Bilateral lower lobe pneumonia. Nonspecific mildly distended fluid-filled stomach and colon. This is compatible with at least a diarrheal state but no definite focal bowel wall thickening identified. Small amount of nonspecific ascites adjacent to the liver and within the pelvis. Age-indeterminate 50% superior endplate compression fracture of L4, but appears new since 01/07/2015. Remote T10 and T12 compression fractures. Abdominal aortic atherosclerosis. Electronically Signed   By: Harmon PierJeffrey  Hu M.D.   On: 02-24-16 11:31   Dg Chest Portable 1 View  Result Date: 02-24-16 CLINICAL DATA:  Shortness of Breath EXAM: PORTABLE CHEST 1 VIEW COMPARISON:  02/28/2015 FINDINGS: Low lung volumes. Mild elevation of the right hemidiaphragm. No confluent opacities or effusions. Heart is upper limits normal in size. IMPRESSION: Low lung volumes.  No active disease. Electronically Signed   By: Charlett NoseKevin  Dover M.D.   On: 02-24-16 09:41    Microbiology: No results found for this or any previous visit (from the past 240 hour(s)).   Labs: Basic Metabolic Panel:  Recent Labs Lab 10/16/2015 0935  NA 143  K 4.0  CL 110  CO2 12*  GLUCOSE 280*  BUN 32*  CREATININE 1.92*  CALCIUM 9.8   Liver  Function Tests:  Recent Labs Lab 10/16/2015 0935  AST 103*  ALT <5*  ALKPHOS 86  BILITOT 0.9  PROT 6.3*  ALBUMIN 3.0*   No results for input(s): LIPASE, AMYLASE in the last 168 hours. No results for input(s): AMMONIA in the last 168 hours. CBC:  Recent Labs Lab January 13, 2016 0935  WBC 11.4*  NEUTROABS 9.9*  HGB 13.5  HCT 43.3  MCV 102.4*  PLT 180   Cardiac Enzymes: No results for input(s): CKTOTAL, CKMB, CKMBINDEX, TROPONINI in the last 168 hours. D-Dimer No results for input(s): DDIMER in the last 72 hours. BNP: Invalid input(s): POCBNP CBG: No results for input(s): GLUCAP in the last 168 hours. Anemia work up No results for input(s): VITAMINB12, FOLATE, FERRITIN, TIBC, IRON, RETICCTPCT in the last 72 hours. Urinalysis    Component Value Date/Time   COLORURINE YELLOW 07-18-2016 1045   APPEARANCEUR CLOUDY (A) 07-18-2016 1045   LABSPEC 1.015 07-18-2016 1045   PHURINE 5.0 07-18-2016 1045   GLUCOSEU NEGATIVE 07-18-2016 1045   HGBUR SMALL (A) 07-18-2016 1045   BILIRUBINUR NEGATIVE 07-18-2016 1045   KETONESUR NEGATIVE 07-18-2016 1045   PROTEINUR 30 (A) 07-18-2016 1045   UROBILINOGEN 0.2 01/06/2015 1630   NITRITE NEGATIVE 07-18-2016 1045   LEUKOCYTESUR LARGE (A) 07-18-2016 1045   Sepsis Labs Invalid input(s): PROCALCITONIN,  WBC,  LACTICIDVEN     SIGNED:  Brooke PaceSTINSON, Carlye Panameno JEHIEL, MD  [facility] 07-18-2016, 2:20 PM Pager   If 7PM-7AM, please contact night-coverage [email or web address] Password [email or web address]

## 2016-06-22 NOTE — ED Notes (Signed)
Admitting Provider at bedside. 

## 2016-06-22 DEATH — deceased

## 2016-06-28 ENCOUNTER — Ambulatory Visit: Payer: Commercial Managed Care - HMO | Admitting: Neurology

## 2017-01-21 IMAGING — CT CT ABD-PELV W/O CM
2 of 4 series · 10 of 46 positions shown, 11 images · non-contrast
Comparison: Prior chest and pelvic radiographs.

CLINICAL DATA: [AGE] female with abdominal distension, pain,
emesis and diarrhea.

EXAM:
CT ABDOMEN AND PELVIS WITHOUT CONTRAST
TECHNIQUE: Multidetector CT imaging of the abdomen and pelvis was performed
following the standard protocol without IV contrast.

[Series 201: routine, idose (2) · axial · 0.78mm/px · z∈[-430,-40]mm · 7 of 94 slices shown, 8 images]
[im 8/94  soft-tissue]
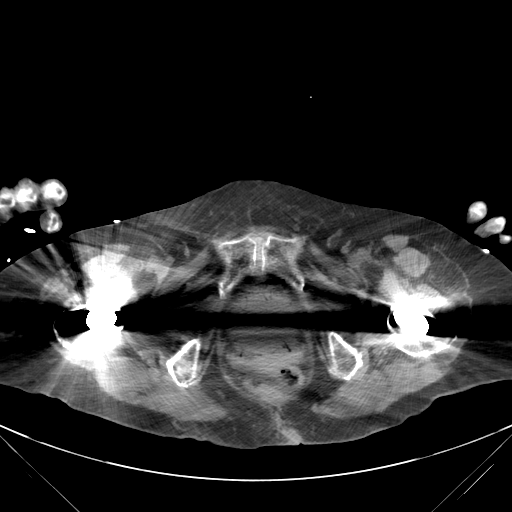
[im 8/94  bone]
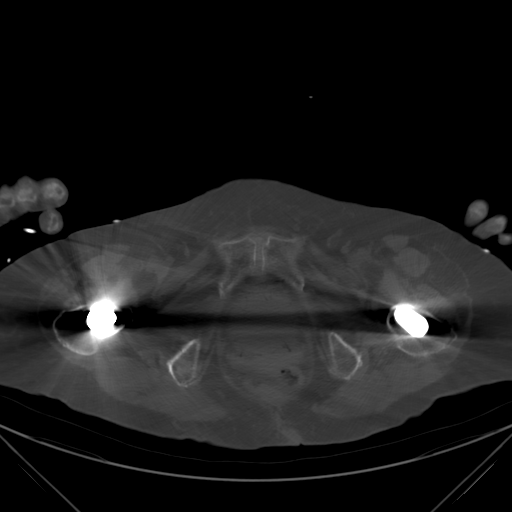
[im 22/94  soft-tissue]
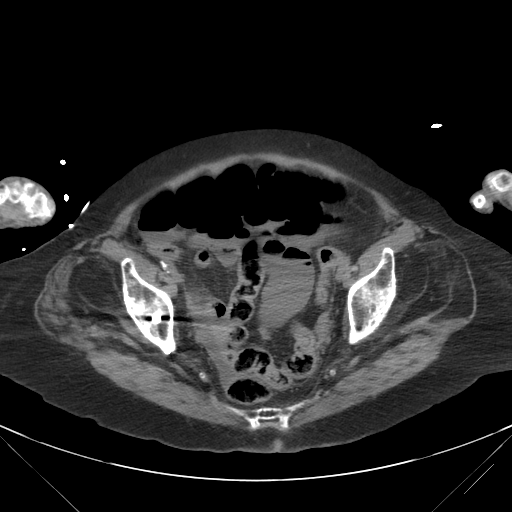
[im 33/94  soft-tissue]
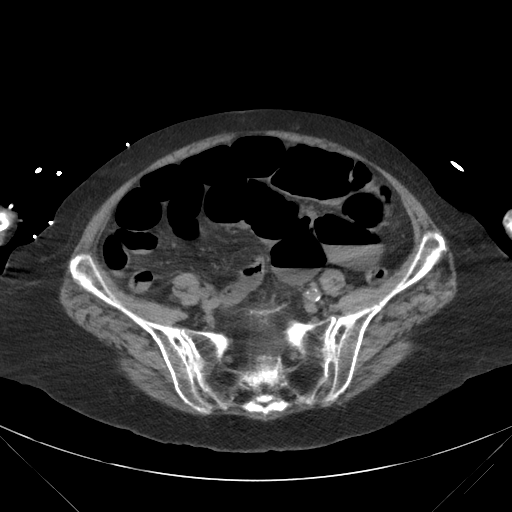
[im 47/94  soft-tissue]
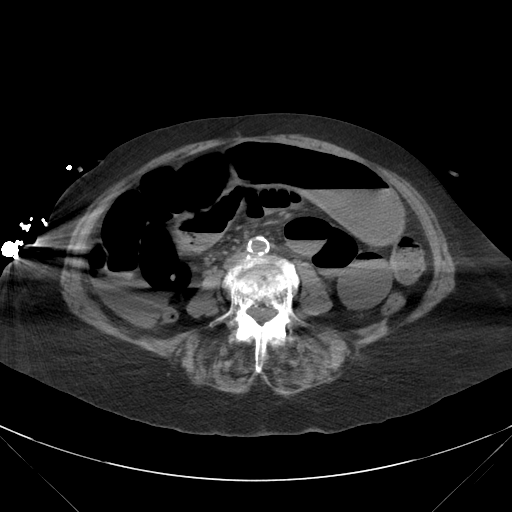
[im 61/94  soft-tissue]
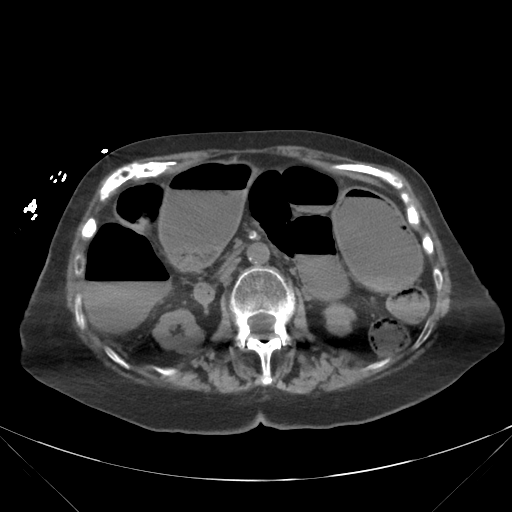
[im 72/94  soft-tissue]
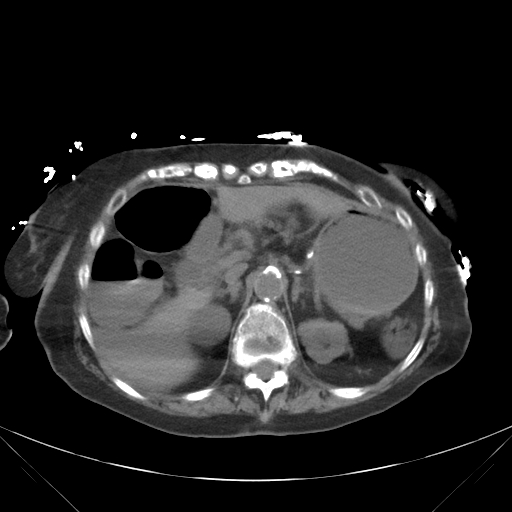
[im 86/94  soft-tissue]
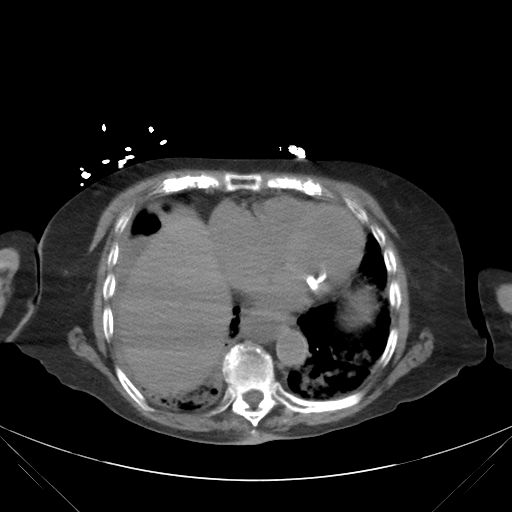

[Series 203: coronals, idose (2) · coronal · 0.45mm/px · 3 of 125 slices shown]
[im 42/125  soft-tissue]
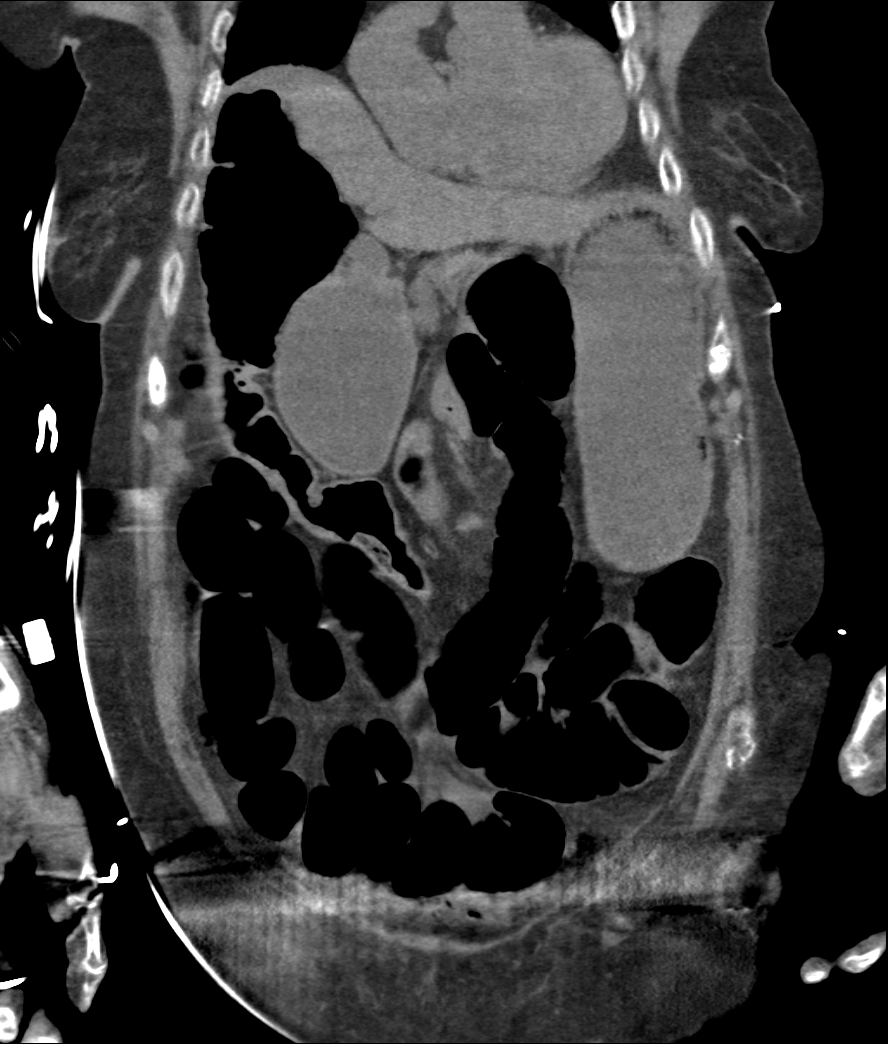
[im 56/125  soft-tissue]
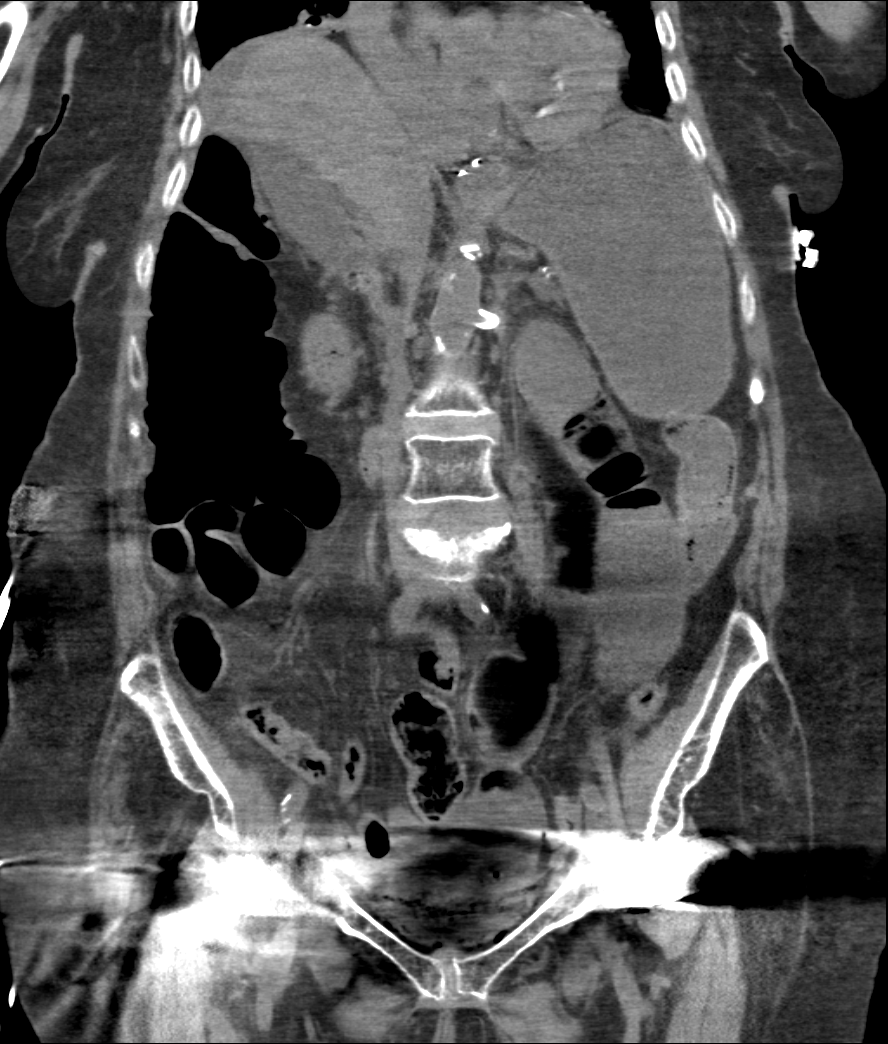
[im 69/125  soft-tissue]
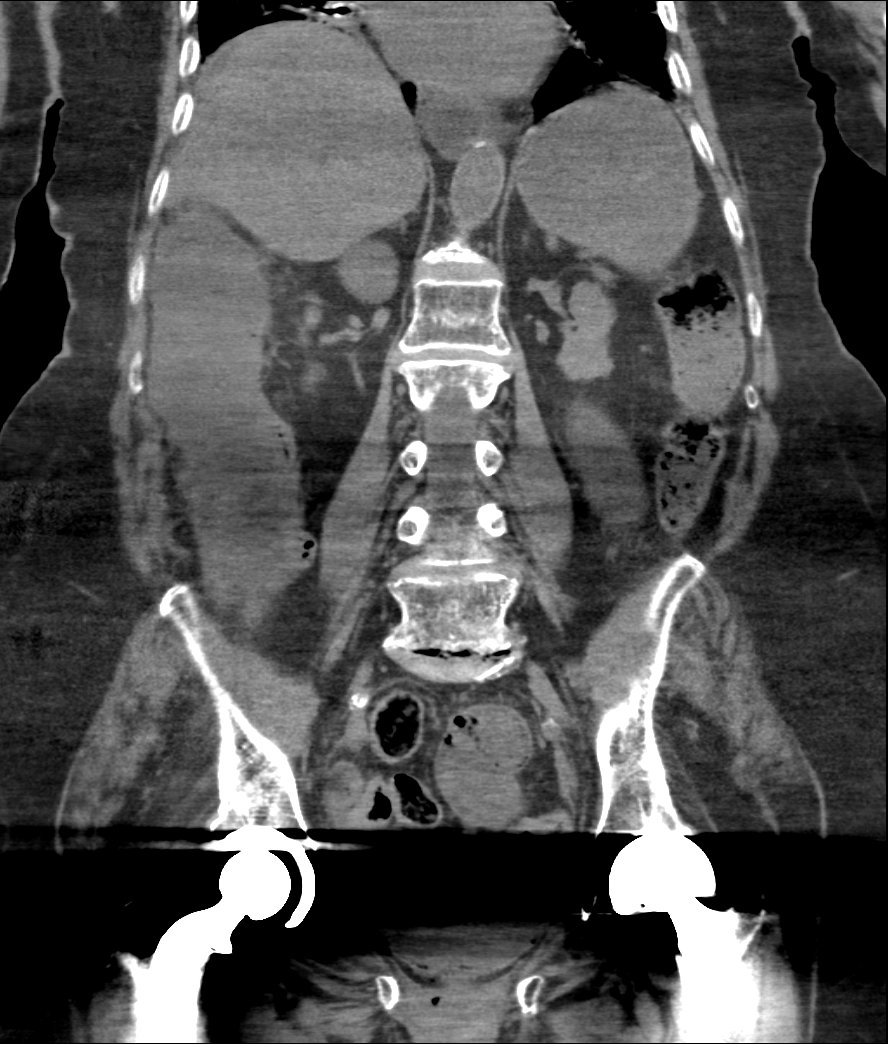

[10 of 46 positions shown; findings below may reference images not displayed]

FINDINGS: Please note that parenchymal abnormalities may be missed without
intravenous contrast. Bilateral hip replacement artifact obscures
detail in the mid and lower pelvis

Lower chest: Bilateral lower lobe consolidation/airspace disease is
compatible with pneumonia. Cardiomegaly and coronary artery
calcifications are present. No pleural effusion identified.

Hepatobiliary: The liver and gallbladder are within normal limits.
No biliary dilatation identified.

Pancreas: Atrophic without other abnormality.

Spleen: Unremarkable

Adrenals/Urinary Tract: Bilateral renal atrophy and probable renal
cysts noted. The adrenal glands and visualized portions of the
bladder are unremarkable.

Stomach/Bowel: The stomach and colon are fluid-filled and mildly
distended. No definite bowel wall thickening identified. No dilated
small bowel loops are noted. There is no evidence of
pneumoperitoneum.

Vascular/Lymphatic: Abdominal aortic atherosclerotic calcifications
noted without aneurysm. No enlarged lymph nodes identified.

Reproductive: No gross abnormality.

Other: A small amount of ascites adjacent to the liver and within
the pelvis noted. No definite focal collection identified.

Musculoskeletal: A 50% superior endplate compression fracture of L4
is identified, age indeterminate but appears new since 01/07/2015

T10 and T12 compression fractures are remote.
IMPRESSION: Bilateral lower lobe pneumonia.

Nonspecific mildly distended fluid-filled stomach and colon. This is
compatible with at least a diarrheal state but no definite focal
bowel wall thickening identified.

Small amount of nonspecific ascites adjacent to the liver and within
the pelvis.

Age-indeterminate 50% superior endplate compression fracture of L4,
but appears new since 01/07/2015. Remote T10 and T12 compression
fractures.

Abdominal aortic atherosclerosis.
